# Patient Record
Sex: Female | Born: 1935 | Race: White | Hispanic: No | Smoking: Never smoker
Health system: Southern US, Community
[De-identification: ages and names within clinical notes are randomized; demographics above are authoritative.]

## PROBLEM LIST (undated history)

## (undated) DIAGNOSIS — Z9289 Personal history of other medical treatment: Secondary | ICD-10-CM

## (undated) DIAGNOSIS — N189 Chronic kidney disease, unspecified: Secondary | ICD-10-CM

## (undated) DIAGNOSIS — I219 Acute myocardial infarction, unspecified: Secondary | ICD-10-CM

## (undated) DIAGNOSIS — I639 Cerebral infarction, unspecified: Secondary | ICD-10-CM

## (undated) DIAGNOSIS — H3530 Unspecified macular degeneration: Secondary | ICD-10-CM

## (undated) DIAGNOSIS — M109 Gout, unspecified: Secondary | ICD-10-CM

## (undated) DIAGNOSIS — Z952 Presence of prosthetic heart valve: Secondary | ICD-10-CM

## (undated) DIAGNOSIS — I119 Hypertensive heart disease without heart failure: Secondary | ICD-10-CM

## (undated) DIAGNOSIS — I2510 Atherosclerotic heart disease of native coronary artery without angina pectoris: Secondary | ICD-10-CM

## (undated) DIAGNOSIS — J189 Pneumonia, unspecified organism: Secondary | ICD-10-CM

## (undated) DIAGNOSIS — I6529 Occlusion and stenosis of unspecified carotid artery: Secondary | ICD-10-CM

## (undated) DIAGNOSIS — K219 Gastro-esophageal reflux disease without esophagitis: Secondary | ICD-10-CM

## (undated) DIAGNOSIS — I5032 Chronic diastolic (congestive) heart failure: Secondary | ICD-10-CM

## (undated) DIAGNOSIS — I350 Nonrheumatic aortic (valve) stenosis: Secondary | ICD-10-CM

## (undated) DIAGNOSIS — J42 Unspecified chronic bronchitis: Secondary | ICD-10-CM

## (undated) DIAGNOSIS — E785 Hyperlipidemia, unspecified: Secondary | ICD-10-CM

## (undated) DIAGNOSIS — R0602 Shortness of breath: Secondary | ICD-10-CM

## (undated) DIAGNOSIS — I10 Essential (primary) hypertension: Secondary | ICD-10-CM

## (undated) HISTORY — PX: CARDIAC VALVE REPLACEMENT: SHX585

## (undated) HISTORY — PX: CARDIAC CATHETERIZATION: SHX172

## (undated) HISTORY — PX: CORONARY ANGIOPLASTY WITH STENT PLACEMENT: SHX49

## (undated) HISTORY — PX: TONSILLECTOMY: SUR1361

## (undated) HISTORY — PX: APPENDECTOMY: SHX54

## (undated) HISTORY — DX: Chronic diastolic (congestive) heart failure: I50.32

## (undated) HISTORY — DX: Occlusion and stenosis of unspecified carotid artery: I65.29

## (undated) HISTORY — PX: CATARACT EXTRACTION W/ INTRAOCULAR LENS  IMPLANT, BILATERAL: SHX1307

## (undated) HISTORY — PX: CHOLECYSTECTOMY: SHX55

---

## 2001-04-11 DIAGNOSIS — I639 Cerebral infarction, unspecified: Secondary | ICD-10-CM

## 2001-04-11 HISTORY — DX: Cerebral infarction, unspecified: I63.9

## 2003-09-26 ENCOUNTER — Emergency Department (HOSPITAL_COMMUNITY): Admission: EM | Admit: 2003-09-26 | Discharge: 2003-09-27 | Payer: Self-pay | Admitting: Emergency Medicine

## 2008-06-05 ENCOUNTER — Ambulatory Visit: Payer: Self-pay | Admitting: Thoracic Surgery (Cardiothoracic Vascular Surgery)

## 2008-06-05 ENCOUNTER — Inpatient Hospital Stay (HOSPITAL_BASED_OUTPATIENT_CLINIC_OR_DEPARTMENT_OTHER): Admission: RE | Admit: 2008-06-05 | Discharge: 2008-06-05 | Payer: Self-pay | Admitting: Cardiology

## 2008-06-09 ENCOUNTER — Ambulatory Visit: Payer: Self-pay | Admitting: Thoracic Surgery (Cardiothoracic Vascular Surgery)

## 2008-07-03 ENCOUNTER — Inpatient Hospital Stay (HOSPITAL_COMMUNITY): Admission: RE | Admit: 2008-07-03 | Discharge: 2008-07-04 | Payer: Self-pay | Admitting: Cardiology

## 2010-01-20 ENCOUNTER — Inpatient Hospital Stay (HOSPITAL_COMMUNITY): Admission: AD | Admit: 2010-01-20 | Discharge: 2010-01-28 | Payer: Self-pay | Admitting: Cardiology

## 2010-01-23 ENCOUNTER — Encounter (INDEPENDENT_AMBULATORY_CARE_PROVIDER_SITE_OTHER): Payer: Self-pay | Admitting: Cardiology

## 2010-06-23 LAB — BASIC METABOLIC PANEL
BUN: 12 mg/dL (ref 6–23)
BUN: 19 mg/dL (ref 6–23)
BUN: 30 mg/dL — ABNORMAL HIGH (ref 6–23)
BUN: 31 mg/dL — ABNORMAL HIGH (ref 6–23)
CO2: 25 mEq/L (ref 19–32)
CO2: 29 mEq/L (ref 19–32)
CO2: 29 mEq/L (ref 19–32)
CO2: 29 mEq/L (ref 19–32)
Calcium: 8.5 mg/dL (ref 8.4–10.5)
Calcium: 8.7 mg/dL (ref 8.4–10.5)
Calcium: 8.7 mg/dL (ref 8.4–10.5)
Calcium: 9.4 mg/dL (ref 8.4–10.5)
Calcium: 9.4 mg/dL (ref 8.4–10.5)
Chloride: 100 mEq/L (ref 96–112)
Chloride: 102 mEq/L (ref 96–112)
Chloride: 102 mEq/L (ref 96–112)
Chloride: 95 mEq/L — ABNORMAL LOW (ref 96–112)
Chloride: 96 mEq/L (ref 96–112)
Creatinine, Ser: 0.8 mg/dL (ref 0.4–1.2)
Creatinine, Ser: 0.9 mg/dL (ref 0.4–1.2)
Creatinine, Ser: 2.71 mg/dL — ABNORMAL HIGH (ref 0.4–1.2)
GFR calc Af Amer: 48 mL/min — ABNORMAL LOW (ref >60)
GFR calc Af Amer: >60 mL/min (ref >60)
GFR calc Af Amer: >60 mL/min (ref >60)
GFR calc Af Amer: >60 mL/min (ref >60)
GFR calc Af Amer: >60 mL/min (ref >60)
GFR calc Af Amer: >60 mL/min (ref >60)
GFR calc Af Amer: >60 mL/min (ref >60)
GFR calc non Af Amer: 17 mL/min — ABNORMAL LOW (ref >60)
GFR calc non Af Amer: 36 mL/min — ABNORMAL LOW (ref >60)
GFR calc non Af Amer: 50 mL/min — ABNORMAL LOW (ref >60)
GFR calc non Af Amer: 57 mL/min — ABNORMAL LOW (ref >60)
GFR calc non Af Amer: >60 mL/min (ref >60)
GFR calc non Af Amer: >60 mL/min (ref >60)
GFR calc non Af Amer: >60 mL/min (ref >60)
Glucose, Bld: 108 mg/dL — ABNORMAL HIGH (ref 70–99)
Glucose, Bld: 118 mg/dL — ABNORMAL HIGH (ref 70–99)
Glucose, Bld: 119 mg/dL — ABNORMAL HIGH (ref 70–99)
Glucose, Bld: 158 mg/dL — ABNORMAL HIGH (ref 70–99)
Glucose, Bld: 42 mg/dL — CL (ref 70–99)
Potassium: 2.6 mEq/L — CL (ref 3.5–5.1)
Potassium: 3.1 mEq/L — ABNORMAL LOW (ref 3.5–5.1)
Potassium: 3.6 mEq/L (ref 3.5–5.1)
Potassium: 3.8 mEq/L (ref 3.5–5.1)
Potassium: 3.9 mEq/L (ref 3.5–5.1)
Potassium: 4.3 mEq/L (ref 3.5–5.1)
Sodium: 133 mEq/L — ABNORMAL LOW (ref 135–145)
Sodium: 134 mEq/L — ABNORMAL LOW (ref 135–145)
Sodium: 135 mEq/L (ref 135–145)
Sodium: 137 mEq/L (ref 135–145)
Sodium: 137 mEq/L (ref 135–145)
Sodium: 138 mEq/L (ref 135–145)
Sodium: 139 mEq/L (ref 135–145)

## 2010-06-23 LAB — URINALYSIS, MICROSCOPIC ONLY
Bilirubin Urine: NEGATIVE
Hgb urine dipstick: NEGATIVE
Nitrite: NEGATIVE
Protein, ur: NEGATIVE mg/dL
Specific Gravity, Urine: 1.013 (ref 1.005–1.030)
Urobilinogen, UA: 1 mg/dL (ref 0.0–1.0)

## 2010-06-23 LAB — CARDIAC PANEL(CRET KIN+CKTOT+MB+TROPI)
CK, MB: 0.6 ng/mL (ref 0.3–4.0)
CK, MB: 1.4 ng/mL (ref 0.3–4.0)
CK, MB: 2 ng/mL (ref 0.3–4.0)
Relative Index: INVALID (ref 0.0–2.5)
Relative Index: INVALID (ref 0.0–2.5)
Relative Index: INVALID (ref 0.0–2.5)
Total CK: 38 U/L (ref 7–177)
Total CK: 47 U/L (ref 7–177)
Total CK: 52 U/L (ref 7–177)
Troponin I: 0.03 ng/mL (ref 0.00–0.06)
Troponin I: 0.04 ng/mL (ref 0.00–0.06)

## 2010-06-23 LAB — COMPREHENSIVE METABOLIC PANEL
ALT: 10 U/L (ref 0–35)
AST: 17 U/L (ref 0–37)
Alkaline Phosphatase: 67 U/L (ref 39–117)
CO2: 27 mEq/L (ref 19–32)
Calcium: 9.1 mg/dL (ref 8.4–10.5)
GFR calc Af Amer: >60 mL/min (ref >60)
GFR calc non Af Amer: >60 mL/min (ref >60)
Potassium: 3.1 mEq/L — ABNORMAL LOW (ref 3.5–5.1)
Sodium: 139 mEq/L (ref 135–145)
Total Protein: 5.7 g/dL — ABNORMAL LOW (ref 6.0–8.3)

## 2010-06-23 LAB — BRAIN NATRIURETIC PEPTIDE
Pro B Natriuretic peptide (BNP): 1061 pg/mL — ABNORMAL HIGH (ref 0.0–100.0)
Pro B Natriuretic peptide (BNP): 1465 pg/mL — ABNORMAL HIGH (ref 0.0–100.0)
Pro B Natriuretic peptide (BNP): 1664 pg/mL — ABNORMAL HIGH (ref 0.0–100.0)
Pro B Natriuretic peptide (BNP): 53 pg/mL (ref 0.0–100.0)
Pro B Natriuretic peptide (BNP): 585 pg/mL — ABNORMAL HIGH (ref 0.0–100.0)
Pro B Natriuretic peptide (BNP): 725 pg/mL — ABNORMAL HIGH (ref 0.0–100.0)

## 2010-06-23 LAB — CBC
HCT: 33.5 % — ABNORMAL LOW (ref 36.0–46.0)
Hemoglobin: 10.5 g/dL — ABNORMAL LOW (ref 12.0–15.0)
Hemoglobin: 10.9 g/dL — ABNORMAL LOW (ref 12.0–15.0)
MCHC: 33.1 g/dL (ref 30.0–36.0)
MCV: 85.9 fL (ref 78.0–100.0)
RBC: 3.9 MIL/uL (ref 3.87–5.11)
WBC: 14.9 10*3/uL — ABNORMAL HIGH (ref 4.0–10.5)
WBC: 7.4 10*3/uL (ref 4.0–10.5)

## 2010-06-23 LAB — URINE CULTURE
Colony Count: 6000
Culture  Setup Time: 201110190107

## 2010-06-23 LAB — DIFFERENTIAL
Eosinophils Absolute: 0.3 10*3/uL (ref 0.0–0.7)
Eosinophils Relative: 4 % (ref 0–5)
Lymphs Abs: 1 10*3/uL (ref 0.7–4.0)
Monocytes Relative: 8 % (ref 3–12)

## 2010-07-22 LAB — BASIC METABOLIC PANEL
BUN: 12 mg/dL (ref 6–23)
GFR calc Af Amer: >60 mL/min (ref >60)
GFR calc non Af Amer: >60 mL/min (ref >60)
Potassium: 3.3 mEq/L — ABNORMAL LOW (ref 3.5–5.1)
Sodium: 138 mEq/L (ref 135–145)

## 2010-07-22 LAB — CBC
HCT: 29.3 % — ABNORMAL LOW (ref 36.0–46.0)
Hemoglobin: 10.5 g/dL — ABNORMAL LOW (ref 12.0–15.0)
Platelets: 130 10*3/uL — ABNORMAL LOW (ref 150–400)
WBC: 7.9 10*3/uL (ref 4.0–10.5)

## 2010-07-27 LAB — POCT I-STAT 3, ART BLOOD GAS (G3+)
Acid-base deficit: 1 mmol/L (ref 0.0–2.0)
Bicarbonate: 22.4 mEq/L (ref 20.0–24.0)
pO2, Arterial: 64 mmHg — ABNORMAL LOW (ref 80.0–100.0)

## 2010-08-24 NOTE — Cardiovascular Report (Signed)
NAMESTAMATIA, MASRI                ACCOUNT NO.:  [id number]   MEDICAL RECORD NO.:  [id number]          PATIENT TYPE:  OIB   LOCATION:  1962                         FACILITY:  [facility]   PHYSICIAN:  Armanda Magic, M.D.     DATE OF BIRTH:  05/19/1923   DATE OF PROCEDURE:  DATE OF DISCHARGE:  06/05/2008                            CARDIAC CATHETERIZATION   PROCEDURE:  Coronary angiography.   OPERATOR:  Armanda Magic, MD   INDICATIONS:  Severe aortic stenosis, shortness of breath, and chest  pain.   COMPLICATIONS:  None.   INTRAVENOUS ACCESS:  Via right femoral artery, 4-French sheath.   INTRAVENOUS MEDICATIONS:  Versed 1 mg.   This is an 77 year old female who presented recently to my office with  new onset of shortness of breath and chest tightness.  She was noted to  have a systolic heart murmur, consistent with aortic valve disease, and  underwent 2-D echocardiogram.  A 2-D echocardiogram showed severe aortic  stenosis with an aortic valve area of less than 1 sq cm.  She now  presents for cardiac catheterization for further evaluation.   The patient was brought to the cardiac catheterization laboratory in a  fasting nonsedated state.  Informed consent was obtained.  The patient  was connected to continuous heart rate and pulse oximetry monitor,  intermittent blood pressure monitor.  The right groin was prepped and  draped in sterile fashion.  A 1% Xylocaine was used for local  anesthesia.  Using the modified Seldinger technique, a 4-French sheath  was placed in the right femoral artery.  Of note, this required multiple  groin sticks in order to access the femoral artery with adequate blood  flow to pass a wire.  Under fluoroscopic guidance, a 4-French JL4  catheter was placed into the vicinity of the left main coronary artery  but could not cannulate the artery.  The entire aortic root, ascending  aorta, and ostia of the coronary arteries were heavily calcified, making  catheter  manipulation very difficult.  The catheter was exchanged out  over a guidewire for a 4-French JL3.5 catheter, which successfully  engaged the coronary ostium.  Multiple cine films were taken at 30-  degree RAO, 40-degree LAO views.  This catheter was then exchanged out  over a guidewire for a 4-French 3DRCA catheter, which again was  unsuccessful in engaging the coronary ostium.  The catheter was  exchanged out over a guidewire for a 4-French JR4 catheter, which  successfully engaged the coronary ostium.  Multiple cine films taken at  30-degree RAO and 40-degree LAO views.  The catheter was then removed  over a guide wire.  Attempts across the aortic valve were not performed  because of the patient's heavily calcified aorta and aortic valve with  risk of possibly dislodging a cholesterol plaque.  At the end of the  procedure, all catheters and sheaths were removed.  Manual compression  was performed, so adequate hemostasis was obtained.  The patient was  transferred back to room in stable condition.  Of note, a right heart  catheterization was not performed because  after multiple attempts at  getting access into the right femoral artery, there was a moderate-sized  groin hematoma.  Access into the venous system was attempted several  times but was unsuccessful.   RESULTS:  1. Left main coronary artery is heavily calcified but widely patent      and bifurcates into left anterior descending artery and left      circumflex artery.  The left anterior descending artery is widely      patent at its proximal portion giving large to moderate sized first      diagonal branch, which is widely patent.  Just distal to the      takeoff of the first diagonal branch, there was an 80% lesion of      the LAD.  The ongoing LAD is widely patent and traverses the apex.  2. The left circumflex artery gives rise to a large posterior      descending artery, thereby making this a left dominant system.  The       ongoing left circumflex traverses the AV groove and is widely      patent.  3. The right coronary artery is nondominant with a 90% ostial lesion      and then a 70% proximal to mid lesion.  The ongoing RCA terminates      in a posterolateral branch, which is widely patent.   CONCLUSION:  1. Two-vessel obstructive coronary disease.  2. Normal left ventricular function by echocardiogram.  3. Severe aortic stenosis by echocardiogram.  4. Heavily calcified aortic root and aortic valve.   PLAN:  CVTS consult.      Armanda Magic, M.D.  Electronically Signed     TT/MEDQ  D:  06/05/2008  T:  06/06/2008  Job:  [id number]

## 2010-08-24 NOTE — Discharge Summary (Signed)
Kaylee Schmitt, Kaylee Schmitt                ACCOUNT NO.:  [id number]   MEDICAL RECORD NO.:  [id number]          PATIENT TYPE:  OIB   LOCATION:  2501                         FACILITY:  [facility]   PHYSICIAN:  Francisca December, M.D.  DATE OF BIRTH:  [birth year]   DATE OF ADMISSION:  07/03/2008  DATE OF DISCHARGE:  07/04/2008                               DISCHARGE SUMMARY   DISCHARGE DIAGNOSES:  1. Coronary artery disease, status post bare metal stent to an left      anterior descending lesion.  2. Aortic stenosis.  3. Hypertension.  4. Coronary artery disease.  5. Long-term medication use.  6. Dyslipidemia.   Kaylee Schmitt is an 77 year old female who initially underwent  catheterization in February 2010 and was found to have two-vessel  obstructive coronary artery disease.  The right coronary artery,  nondominant, had a 90% ostial lesion and then a 70% proximal to mid  lesion.  Just distal to the takeoff of the first diagonal branch, there  was an 80% LAD lesion.  The patient was also noted to have severe aortic  stenosis by echocardiogram.   Initially, it was felt that she would be a surgical candidate, however  she followed Dr. Cornelius Moras and he felt that the patient had severe  calcification of the entire aorta (diffusely and circumferentially) and  he felt the patient would not be a candidate for conventional surgical  approach.  He discussed options with the patient with one option being  continued medical therapy or going ahead with percutaneous intervention.  He did state that percutaneous or transapical aortic valve replacement  could be considered on an investigational basis such as use of the  Edwards Sapien valve which would have to be done at [facility] in  [place].   Ultimately, it was decided to bring the patient in for percutaneous  intervention.  She was brought into the hospital on July 03, 2008, and  underwent Liberte stenting x2 to the LAD lesion.  The patient tolerated  this well and was ready to go home the following day.   LABORATORY STUDIES:  During the patient's hospital stay included sodium  138 and potassium 3.3, repleted.  BUN 12, creatinine 0.79.  Hemoglobin  10.5, hematocrit 29.3, white count 7.9, and platelets 130.  EKG shows  sinus bradycardia, rate 52 with nonspecific ST-T wave changes  inferolaterally.   DISCHARGE MEDICATIONS:  1. Enteric-coated aspirin 325 mg a day.  2. Prilosec 20 mg a day.  3. Plavix 75 mg a day.  4. Nitrofurantoin 100 mg a day.  5. Sertraline (Zoloft) 100 mg a day.  6. Estrace daily.  7. Detrol LA 1 daily.  8. Simvastatin 40 mg a day.  9. Diovan 80 mg a day.  10.Vitamin D daily.  11.Atenolol 50 mg t.i.d.  12.Norvasc 10 mg a day.  13.Altace 10 mg a day.   The patient is being discharged home in stable but improved condition.  Clean cath site gently with soap and water.  No scrubbing.  Increase  activity slowly.  No lifting over 10 pounds for 1 week.  No driving for  2 days.  Follow up with Dr. Mayford Knife on July 21, 2008, at 3:15 p.m.  Dr.  Amil Amen did state that the patient would need to remain on Plavix for  minimum of 1-2 months.      Guy Franco, P.A.      Francisca December, M.D.  Electronically Signed    LB/MEDQ  D:  07/04/2008  T:  07/04/2008  Job:  [id number]   cc:   Armanda Magic, M.D.

## 2010-08-24 NOTE — Assessment & Plan Note (Signed)
OFFICE VISIT   LIAH, MORR  DOB:  [birth year]                                        June 09, 2008  CHART #:  [id number]   The patient returns to the office today for further consultation  regarding severe aortic stenosis and coronary artery disease.  She was  originally seen in consultation at the time of her heart catheterization  on June 05, 2008.  A full consultation note was dictated at that  time.  She returns to the office today with her daughter to discuss  matters further.  She has brought with her a copy of a chest x-ray  performed recently on June 04, 2008.  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  She has severe diffuse  calcification throughout her entire ascending transverse and proximal  descending thoracic aorta.  There was suggestion of this noted at the  time for her heart catheterization, and this routine plain film chest x-  ray confirms these findings.  The severity of calcification is diffuse,  circumferential and involving the entire aorta.  Under the  circumstances, I suspect that it would be a big mistake to consider  conventional surgical approach for aortic valve replacement and coronary  artery bypass grafting.   I have discussed alternatives at length with the patient and her  daughter.  One option would be to continue with medical therapy  indefinitely.  Another option might be to include percutaneous coronary  intervention and stenting of her coronary artery stenosis with long-term  medical therapy for her aortic stenosis.  Finally, percutaneous or  transapical aortic valve replacement could be considered on an  investigational basis such as use of the [employer] valve.  The  nearest center that continues to implant the Sapien valve in the  patient's is [facility] in [place].  This valve was not yet FDA  approved.  There are other valves in development that may be available  in the near future as well, and it is my understanding that it would not  be longer for the [employer] percutaneous valve will be implanted on an  investigational basis at [facility].  I discussed  these matters at length with the patient and her daughter.  All of her  questions have been addressed.  They will continue to discuss matters  further with Dr. Mayford Knife.  All of their questions have been addressed.  Again, I do not feel that the patient should  be considered a candidate for conventional aortic valve replacement and  coronary artery bypass grafting.   Salvatore Decent. Cornelius Moras, M.D.  Electronically Signed   CHO/MEDQ  D:  06/09/2008  T:  06/09/2008  Job:  [id number]   cc:   Armanda Magic, M.D.  Eugene Gavia, MD

## 2010-08-24 NOTE — Consult Note (Signed)
[name], [name]                ACCOUNT NO.:  [id number]   MEDICAL RECORD NO.:  [id number]          PATIENT TYPE:  OIB   LOCATION:  1962                         FACILITY:  [facility]   PHYSICIAN:  Salvatore Decent. Cornelius Moras, M.D. DATE OF BIRTH:  01/04/1924   DATE OF CONSULTATION:  06/05/2008  DATE OF DISCHARGE:                                 CONSULTATION   REQUESTING PHYSICIAN:  Armanda Magic, MD   PRIMARY CARE PHYSICIAN:  Eugene Gavia, MD, [facility].   REASON FOR CONSULTATION:  Severe aortic stenosis and two-vessel coronary  artery disease.   HISTORY OF PRESENT ILLNESS:  [name] is an 77 year old widowed white  female, who currently lives locally here in [place] with her  daughter.  Her cardiac history dates back to 2003, when she suffered a  right hemispheric stroke.  At that time, she was in [place] and details  of that hospitalization are not available.  Her stroke was manifested  initially as left-sided hemiplegia.  She now has mild left-sided  weakness.  She walks with a cane.  One year later, she was treated for  congestive heart failure, again while she lived in [place].  She was  told that she had a heart murmur at that time.  Her congestive heart  failure symptoms improved with medical therapy.  More recently, she has  been followed by Dr. Karel Jarvis.  She has a known history of aortic  stenosis.  Over the last year, she has developed worsening symptoms of  progressive exertional shortness of breath.  These symptoms have  progressed substantially over the last several months.  She was referred  to Dr. Mayford Knife, who first evaluated the patient 3 weeks ago.  An  echocardiogram was performed on June 03, 2008, at [facility].  By report, this examination demonstrated normal left ventricular size  and function with mild concentric left ventricular hypertrophy and  severe aortic stenosis.  The peak velocity across the valve was measured  4.1 meters per second  with peak and mean transvalvular gradients  estimated 67 and 35 mmHg respectively.  The aortic valve area was  estimated to be 0.76 cm2.  Ms. Giel was brought in for elective  cardiac catheterization today by Dr. Mayford Knife.  Coronary arteriography  revealed severe two-vessel coronary artery disease.  The aortic valve  was not crossed at the time of catheterization.  There was severe  calcification involving the aortic root.  Right heart catheterization  was not performed.  Cardiothoracic surgical consultation was requested.   REVIEW OF SYSTEMS:  GENERAL:  The patient reports progressive exertional  shortness of breath and fatigue over the last year.  Her appetite is  decreased substantially, although she has not lost any weight.  CARDIAC:  The patient denies resting shortness of breath, although she admits that  she will get winded quickly even with talking.  She denies PND,  orthopnea, palpitations, syncope.  The patient has had some heaviness  and tightness across her chest associated with episodes of severe  shortness of breath.  These are usually related to physical activity.  She has not had any nocturnal angina.  She has not had PND.  She has not  had tachypalpitations.  RESPIRATORY:  Notable for progressive shortness  of breath.  The patient denies productive cough, hemoptysis, wheezing.  GASTROINTESTINAL:  Negative.  The patient reports no difficulty  swallowing, although she states that occasionally she will get choked on  food.  She has chronic constipation, but stable bowel function.  She  denies hematochezia, hematemesis, melena.  MUSCULOSKELETAL:  Notable for  moderate chronic back pain as well as some chronic pain in her right  knee related to degenerative arthritis.  NEUROLOGIC:  Notable for  chronic mild left-sided weakness.  Her gait is mildly unsteady and she  walks with a cane.  GENITOURINARY:  Notable for history of chronic  recurrent urinary infections, although the  symptoms have been improved  recently.  She has stress urinary incontinence.  HEENT:  Negative.  INFECTIOUS:  Negative.   PAST MEDICAL HISTORY:  1. Aortic stenosis.  2. Coronary artery disease.  3. Hypertension.  4. Congestive heart failure.  5. Cerebrovascular disease, status post stroke in 2003.  6. Stress urinary incontinence.  7. Chronic recurrent urinary tract infection.  8. Hyperlipidemia.   PAST SURGICAL HISTORY:  1. Appendectomy.  2. Cholecystectomy.  3. Cataract extraction.  4. Tonsillectomy.   FAMILY HISTORY:  Noncontributory.   SOCIAL HISTORY:  The patient is widowed and lives with her daughter  locally here in [place].  She has 7 living grown children and  numerous grandchildren.  She lives a sedentary lifestyle.   CURRENT MEDICATIONS:  1. Prilosec OTC 20 mg daily.  2. Nitrofurantoin 100 mg daily.  3. Aspirin 81 mg daily.  4. Sertraline 180 mg daily.  5. Estrace 0.1 mg as directed.  6. Atenolol 50 mg 3 times daily.  7. Norvasc 5 mg daily.  8. Multivitamin 1 tablet daily.  9. Detrol LA 2 mg daily.  10.Altace 5 mg daily.   DRUG ALLERGIES:  SULFA and PENICILLIN.   PHYSICAL EXAMINATION:  GENERAL:  The patient is a well-appearing,  moderately obese, elderly female, who appears her stated age, in no  acute distress.  She has mildly slurred speech.  VITAL SIGNS:  She is in sinus rhythm.  Blood pressure is elevated.  HEENT:  Unrevealing.  NECK:  Supple.  There are bilateral carotid bruits.  CARDIAC:  Auscultation of the chest reveals regular rate and rhythm.  There is a prominent grade 3/6 late-peaking crescendo-decrescendo  systolic murmur heard best at the right upper sternal border with  radiation all across the precordium into the neck.  No diastolic murmurs  are noted.  LUNGS:  Breath sounds are clear with some bibasilar inspiratory  crackles.  No wheezes or rhonchi are noted.  ABDOMEN:  Soft and nontender.  Bowel sounds are present.  EXTREMITIES:   Warm and adequately perfused.  Pulses are palpable in the  posterior tibial position.  There is no sign of significant venous  insufficiency.  RECTAL/GU:  Both deferred.  NEUROLOGIC:  Very mild left-sided weakness.   DIAGNOSTIC TESTS:  Report of 2-D echocardiogram performed on June 03, 2008, is reviewed with results as discussed previously.  Cardiac  catheterization performed today is reviewed.  This reveals two-vessel  coronary artery disease with 70-80% stenosis of mid-left anterior  descending coronary artery after takeoff of the large diagonal branch.  The left circumflex coronary artery is relatively free of disease.  There is codominant coronary circulation.  There is 80-90%  ostial  stenosis of the right coronary artery with 70% stenosis of the proximal  right coronary artery.  The aortic root and the ascending aorta are  heavily calcified.  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  Further imaging  might be helpful.   IMPRESSION:  Severe aortic stenosis with two-vessel coronary artery  disease and progressive symptoms of congestive heart failure, functional  class III.  Ms. Pownall has severely calcified ascending thoracic aorta,  which might preclude safe surgical intervention.  She has had a previous  stroke in the past.   PLAN:  I have discussed matters at length with Ms. Salim and her  family here at the bedside this afternoon.  I would like to take a look  at the chest x-ray she had performed recently as well as her recent  echocardiogram to get a better look at the aortic root and the ascending  aorta.  CT angiogram of the aorta might be helpful.  Options might  include continued medical therapy indefinitely with associated guarded  long-term prognosis.  Percutaneous coronary intervention could be  entertained for treatment of her CAD.  Alternatively high-risk surgery  could be entertained depending  upon the status of the aorta.  Finally, a  third alternative might include percutaneously placed aortic valve such  as the Edwards Sapien bioprosthesis, currently under investigational  development.  This could be performed with or without percutaneous  coronary intervention.  I will plan to see Ms. Montrose back in the  office on Monday to review her recent chest x-ray and echocardiogram.  We will discuss matters further at that time.      Salvatore Decent. Cornelius Moras, M.D.  Electronically Signed     CHO/MEDQ  D:  06/05/2008  T:  06/06/2008  Job:  [id number]   cc:   Armanda Magic, M.D.  Eugene Gavia, MD

## 2011-05-09 ENCOUNTER — Ambulatory Visit
Admission: RE | Admit: 2011-05-09 | Discharge: 2011-05-09 | Disposition: A | Discharge status: Home / Self Care | Payer: Medicare Other | Source: Ambulatory Visit | Attending: Cardiology | Admitting: Cardiology

## 2011-05-09 ENCOUNTER — Other Ambulatory Visit: Payer: Self-pay | Admitting: Cardiology

## 2011-05-09 DIAGNOSIS — S0990XA Unspecified injury of head, initial encounter: Secondary | ICD-10-CM

## 2012-10-12 ENCOUNTER — Encounter (HOSPITAL_COMMUNITY): Payer: Self-pay | Admitting: Emergency Medicine

## 2012-10-12 ENCOUNTER — Other Ambulatory Visit: Payer: Self-pay | Admitting: Cardiology

## 2012-10-12 ENCOUNTER — Emergency Department (HOSPITAL_COMMUNITY): Payer: Medicare Other

## 2012-10-12 ENCOUNTER — Inpatient Hospital Stay (HOSPITAL_COMMUNITY)
Admission: EM | Admit: 2012-10-12 | Discharge: 2012-10-19 | DRG: 286 | Disposition: A | Discharge status: Home / Self Care | Payer: Medicare Other | Attending: Cardiology | Admitting: Cardiology

## 2012-10-12 DIAGNOSIS — I5023 Acute on chronic systolic (congestive) heart failure: Secondary | ICD-10-CM

## 2012-10-12 DIAGNOSIS — Z9861 Coronary angioplasty status: Secondary | ICD-10-CM

## 2012-10-12 DIAGNOSIS — I119 Hypertensive heart disease without heart failure: Secondary | ICD-10-CM

## 2012-10-12 DIAGNOSIS — I359 Nonrheumatic aortic valve disorder, unspecified: Secondary | ICD-10-CM

## 2012-10-12 DIAGNOSIS — I110 Hypertensive heart disease with heart failure: Secondary | ICD-10-CM

## 2012-10-12 DIAGNOSIS — M109 Gout, unspecified: Secondary | ICD-10-CM | POA: Insufficient documentation

## 2012-10-12 DIAGNOSIS — F411 Generalized anxiety disorder: Secondary | ICD-10-CM | POA: Diagnosis present

## 2012-10-12 DIAGNOSIS — I5031 Acute diastolic (congestive) heart failure: Secondary | ICD-10-CM | POA: Diagnosis present

## 2012-10-12 DIAGNOSIS — I350 Nonrheumatic aortic (valve) stenosis: Secondary | ICD-10-CM

## 2012-10-12 DIAGNOSIS — I10 Essential (primary) hypertension: Secondary | ICD-10-CM | POA: Diagnosis present

## 2012-10-12 DIAGNOSIS — E876 Hypokalemia: Secondary | ICD-10-CM | POA: Diagnosis present

## 2012-10-12 DIAGNOSIS — N179 Acute kidney failure, unspecified: Secondary | ICD-10-CM | POA: Diagnosis present

## 2012-10-12 DIAGNOSIS — D649 Anemia, unspecified: Secondary | ICD-10-CM | POA: Diagnosis present

## 2012-10-12 DIAGNOSIS — Z954 Presence of other heart-valve replacement: Secondary | ICD-10-CM

## 2012-10-12 DIAGNOSIS — I2789 Other specified pulmonary heart diseases: Secondary | ICD-10-CM | POA: Diagnosis present

## 2012-10-12 DIAGNOSIS — N189 Chronic kidney disease, unspecified: Secondary | ICD-10-CM | POA: Diagnosis present

## 2012-10-12 DIAGNOSIS — I130 Hypertensive heart and chronic kidney disease with heart failure and stage 1 through stage 4 chronic kidney disease, or unspecified chronic kidney disease: Principal | ICD-10-CM | POA: Diagnosis present

## 2012-10-12 DIAGNOSIS — M129 Arthropathy, unspecified: Secondary | ICD-10-CM | POA: Diagnosis present

## 2012-10-12 DIAGNOSIS — I69959 Hemiplegia and hemiparesis following unspecified cerebrovascular disease affecting unspecified side: Secondary | ICD-10-CM

## 2012-10-12 DIAGNOSIS — Z8673 Personal history of transient ischemic attack (TIA), and cerebral infarction without residual deficits: Secondary | ICD-10-CM | POA: Insufficient documentation

## 2012-10-12 DIAGNOSIS — I2510 Atherosclerotic heart disease of native coronary artery without angina pectoris: Secondary | ICD-10-CM | POA: Diagnosis present

## 2012-10-12 DIAGNOSIS — I509 Heart failure, unspecified: Secondary | ICD-10-CM | POA: Diagnosis present

## 2012-10-12 DIAGNOSIS — I080 Rheumatic disorders of both mitral and aortic valves: Secondary | ICD-10-CM | POA: Diagnosis present

## 2012-10-12 DIAGNOSIS — E785 Hyperlipidemia, unspecified: Secondary | ICD-10-CM | POA: Diagnosis present

## 2012-10-12 HISTORY — DX: Hyperlipidemia, unspecified: E78.5

## 2012-10-12 HISTORY — DX: Hypertensive heart disease without heart failure: I11.9

## 2012-10-12 HISTORY — DX: Nonrheumatic aortic (valve) stenosis: I35.0

## 2012-10-12 HISTORY — DX: Atherosclerotic heart disease of native coronary artery without angina pectoris: I25.10

## 2012-10-12 HISTORY — DX: Gout, unspecified: M10.9

## 2012-10-12 LAB — COMPREHENSIVE METABOLIC PANEL
ALT: 7 U/L (ref 0–35)
AST: 13 U/L (ref 0–37)
Albumin: 3.6 g/dL (ref 3.5–5.2)
Alkaline Phosphatase: 83 U/L (ref 39–117)
BUN: 21 mg/dL (ref 6–23)
Chloride: 103 mEq/L (ref 96–112)
Potassium: 3.2 mEq/L — ABNORMAL LOW (ref 3.5–5.1)
Sodium: 140 mEq/L (ref 135–145)
Total Bilirubin: 1.4 mg/dL — ABNORMAL HIGH (ref 0.3–1.2)
Total Protein: 6.3 g/dL (ref 6.0–8.3)

## 2012-10-12 LAB — CBC
HCT: 30 % — ABNORMAL LOW (ref 36.0–46.0)
Hemoglobin: 11 g/dL — ABNORMAL LOW (ref 12.0–15.0)
Hemoglobin: 10.3 g/dL — ABNORMAL LOW (ref 12.0–15.0)
MCH: 29.7 pg (ref 26.0–34.0)
MCHC: 34.3 g/dL (ref 30.0–36.0)
MCV: 87 fL (ref 78.0–100.0)
RBC: 3.45 MIL/uL — ABNORMAL LOW (ref 3.87–5.11)
WBC: 11.5 10*3/uL — ABNORMAL HIGH (ref 4.0–10.5)

## 2012-10-12 LAB — URINALYSIS, ROUTINE W REFLEX MICROSCOPIC
Glucose, UA: NEGATIVE mg/dL
Hgb urine dipstick: NEGATIVE
Ketones, ur: NEGATIVE mg/dL
Protein, ur: NEGATIVE mg/dL
Urobilinogen, UA: 0.2 mg/dL (ref 0.0–1.0)

## 2012-10-12 LAB — BASIC METABOLIC PANEL
BUN: 22 mg/dL (ref 6–23)
Calcium: 9.4 mg/dL (ref 8.4–10.5)
Creatinine, Ser: 1.1 mg/dL (ref 0.50–1.10)
GFR calc non Af Amer: 44 mL/min — ABNORMAL LOW (ref >90)
Glucose, Bld: 120 mg/dL — ABNORMAL HIGH (ref 70–99)
Sodium: 139 mEq/L (ref 135–145)

## 2012-10-12 LAB — POCT I-STAT TROPONIN I: Troponin i, poc: 0.07 ng/mL (ref 0.00–0.08)

## 2012-10-12 MED ORDER — SODIUM CHLORIDE 0.9 % IV SOLN: 250.0000 mL | INTRAVENOUS | Status: DC | PRN

## 2012-10-12 MED ORDER — POTASSIUM CHLORIDE CRYS ER 20 MEQ PO TBCR
40.0000 meq | EXTENDED_RELEASE_TABLET | Freq: Once | ORAL | Status: AC
  Administered 2012-10-12: 40 meq via ORAL
  Filled 2012-10-12: qty 2

## 2012-10-12 MED ORDER — ALPRAZOLAM 0.25 MG PO TABS
0.2500 mg | ORAL_TABLET | Freq: Two times a day (BID) | ORAL | Status: DC | PRN
  Administered 2012-10-14: 0.25 mg via ORAL
  Filled 2012-10-12: qty 1

## 2012-10-12 MED ORDER — SERTRALINE HCL 100 MG PO TABS
100.0000 mg | ORAL_TABLET | Freq: Every day | ORAL | Status: DC
  Administered 2012-10-13 – 2012-10-19 (×7): 100 mg via ORAL
  Filled 2012-10-12 (×8): qty 1

## 2012-10-12 MED ORDER — FUROSEMIDE 10 MG/ML IJ SOLN
40.0000 mg | Freq: Two times a day (BID) | INTRAMUSCULAR | Status: DC
  Administered 2012-10-12 – 2012-10-14 (×4): 40 mg via INTRAVENOUS
  Filled 2012-10-12 (×6): qty 4

## 2012-10-12 MED ORDER — HYDRALAZINE HCL 20 MG/ML IJ SOLN
10.0000 mg | INTRAMUSCULAR | Status: DC | PRN
  Administered 2012-10-13 (×2): 10 mg via INTRAVENOUS
  Filled 2012-10-12 (×2): qty 1

## 2012-10-12 MED ORDER — FUROSEMIDE 10 MG/ML IJ SOLN: 40.0000 mg | Freq: Two times a day (BID) | INTRAMUSCULAR | Status: DC

## 2012-10-12 MED ORDER — NITROGLYCERIN 0.4 MG SL SUBL: 0.4000 mg | SUBLINGUAL_TABLET | SUBLINGUAL | Status: DC | PRN

## 2012-10-12 MED ORDER — AMLODIPINE BESYLATE 5 MG PO TABS
5.0000 mg | ORAL_TABLET | Freq: Every day | ORAL | Status: DC
  Filled 2012-10-12: qty 1

## 2012-10-12 MED ORDER — PANTOPRAZOLE SODIUM 40 MG PO TBEC
40.0000 mg | DELAYED_RELEASE_TABLET | Freq: Every day | ORAL | Status: DC
  Administered 2012-10-13 – 2012-10-19 (×7): 40 mg via ORAL
  Filled 2012-10-12 (×8): qty 1

## 2012-10-12 MED ORDER — ACETAMINOPHEN 325 MG PO TABS
650.0000 mg | ORAL_TABLET | ORAL | Status: DC | PRN
  Administered 2012-10-14: 650 mg via ORAL
  Filled 2012-10-12: qty 2

## 2012-10-12 MED ORDER — AMLODIPINE BESYLATE 5 MG PO TABS
5.0000 mg | ORAL_TABLET | ORAL | Status: DC
  Administered 2012-10-12 – 2012-10-18 (×7): 5 mg via ORAL
  Filled 2012-10-12 (×8): qty 1

## 2012-10-12 MED ORDER — ONDANSETRON HCL 4 MG/2ML IJ SOLN: 4.0000 mg | Freq: Four times a day (QID) | INTRAMUSCULAR | Status: DC | PRN

## 2012-10-12 MED ORDER — FUROSEMIDE 10 MG/ML IJ SOLN
40.0000 mg | Freq: Once | INTRAMUSCULAR | Status: AC
  Administered 2012-10-12: 40 mg via INTRAVENOUS
  Filled 2012-10-12: qty 4

## 2012-10-12 MED ORDER — SODIUM CHLORIDE 0.9 % IJ SOLN: 3.0000 mL | INTRAMUSCULAR | Status: DC | PRN

## 2012-10-12 MED ORDER — SODIUM CHLORIDE 0.9 % IJ SOLN
3.0000 mL | Freq: Two times a day (BID) | INTRAMUSCULAR | Status: DC
  Administered 2012-10-12 – 2012-10-19 (×10): 3 mL via INTRAVENOUS

## 2012-10-12 MED ORDER — ENOXAPARIN SODIUM 30 MG/0.3ML ~~LOC~~ SOLN
30.0000 mg | SUBCUTANEOUS | Status: DC
  Administered 2012-10-12 – 2012-10-17 (×6): 30 mg via SUBCUTANEOUS
  Filled 2012-10-12 (×7): qty 0.3

## 2012-10-12 MED ORDER — ASPIRIN 325 MG PO TABS
325.0000 mg | ORAL_TABLET | Freq: Every day | ORAL | Status: DC
  Administered 2012-10-13 – 2012-10-19 (×6): 325 mg via ORAL
  Filled 2012-10-12 (×8): qty 1

## 2012-10-12 MED ORDER — SPIRONOLACTONE 25 MG PO TABS
25.0000 mg | ORAL_TABLET | Freq: Every day | ORAL | Status: DC
  Administered 2012-10-13 – 2012-10-15 (×3): 25 mg via ORAL
  Filled 2012-10-12 (×3): qty 1

## 2012-10-12 MED ORDER — IRBESARTAN 150 MG PO TABS
150.0000 mg | ORAL_TABLET | Freq: Every day | ORAL | Status: DC
  Administered 2012-10-13 – 2012-10-17 (×5): 150 mg via ORAL
  Filled 2012-10-12 (×5): qty 1

## 2012-10-12 MED ORDER — ATENOLOL 25 MG PO TABS
25.0000 mg | ORAL_TABLET | Freq: Every day | ORAL | Status: DC
  Administered 2012-10-13 – 2012-10-19 (×6): 25 mg via ORAL
  Filled 2012-10-12 (×8): qty 1

## 2012-10-12 NOTE — Progress Notes (Signed)
*  PRELIMINARY RESULTS* Echocardiogram 2D Echocardiogram has been performed.  Kaylee Schmitt 10/12/2012, 2:27 PM

## 2012-10-12 NOTE — ED Notes (Signed)
Pts O2 went down to 87%. Put on 2L [place]. O2 up to 97%

## 2012-10-12 NOTE — H&P (Signed)
History and Physical   Admit date: 10/12/2012 Name:  Kaylee Schmitt Medical record number: [id number] DOB/Age:  13-Jun-1923  79 y.o. female  Primary Cardiologist: Dr. Carolanne Grumbling  Chief complaint/reason for admission:  Shortness of breath and chest pain  HPI:  This 79 year old female has a known history of critical aortic stenosis. 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 At the time she was turned down for valve replacement surgery due to the porcelain aorta and had stenting with 2 non-drug-eluting stents to the LAD by Dr. Amil Amen for an 80% mid LAD stenosis.  She was evaluated at [facility] for percutaneous aortic valve replacement stated that they never heard from them and she eventually decided not to have it done. She currently lives with her daughter and is basically leading a sedentary existence. She is severely dyspneic when she gets out of walks and does have some intermittent chest pressure. She goes around in a wheelchair but is significantly limited with dyspnea on exertion. She has had several days of PND and orthopnea and had difficulty sleeping at night as well as mid sternal chest discomfort and tightness. She came to the emergency room this morning where she was found to be in acute congestive heart failure and is brought in for further evaluation of heart failure in the setting of critical aortic stenosis. She has not had much in the way of peripheral edema.  She does have a remote right brain stroke while she was in [place] that left her with a mild left hemiparesis.   Past Medical History  Diagnosis Date  . Aortic stenosis   . CHF (congestive heart failure)   . CAD (coronary artery disease) 10/12/2012    Cath 2011  Calcified LM,  80% mid LAD, dominant circ without sig disease, Ostial nondominant RCA.  Severe AS  Liberte stent x 2 2011 Dr. Amil Amen    . History of CVA (cerebrovascular accident)     2003 while living in [place].    Right brain with left hemiparesis   . Gout 10/12/2012  . Hypertensive heart disease 10/12/2012  . Hyperlipidemia 10/12/2012      Past Surgical History  Procedure Laterality Date  . Coronary stent placement    . Cholecystectomy    . Appendectomy    . Cataract extraction    .  Allergies: is allergic to penicillins and sulfa antibiotics.   Medications: Prior to Admission medications   Medication Sig Start Date End Date Taking? Authorizing Provider  amLODipine (NORVASC) 5 MG tablet Take 5 mg by mouth daily.   Yes Historical Provider, MD  aspirin 325 MG tablet Take 325 mg by mouth daily.   Yes Historical Provider, MD  atenolol (TENORMIN) 50 MG tablet Take 25 mg by mouth daily.   Yes Historical Provider, MD  furosemide (LASIX) 40 MG tablet Take 40 mg by mouth daily.   Yes Historical Provider, MD  omeprazole (PRILOSEC) 20 MG capsule Take 20 mg by mouth daily.   Yes Historical Provider, MD  sertraline (ZOLOFT) 100 MG tablet Take 100 mg by mouth daily.   Yes Historical Provider, MD  spironolactone (ALDACTONE) 25 MG tablet Take 25 mg by mouth daily.   Yes Historical Provider, MD  valsartan (DIOVAN) 160 MG tablet Take 160 mg by mouth daily.   Yes Historical Provider, MD    Family History:  Family Status  Relation Status Death Age  . Mother Deceased 49  died of CAD  . Father Deceased 32    died of metastatic prostate cancer    Social History:   reports that she has never smoked. She does not have any smokeless tobacco history on file. She reports that she does not drink alcohol or use illicit drugs.   History   Social History Narrative   Widow.  Lives with daughter.     Review of Systems: She is very hard of hearing and wears hearing aids. 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 She has significant abdominal complaints of dyspepsia and significant GI intolerance to foods. She has arthritis involving her right knee. She has some mild weakness  of her left side due to her old stroke. She has significant anxiety. He does have a remote history of gout. Other than as noted above, the remainder of the review of systems is normal  Physical Exam: BP 179/41  Pulse 60  Temp(Src) 97.8 F (36.6 C) (Oral)  Resp 16  SpO2 98%  General appearance: Pleasant elderly female who is currently very hard of hearing Head: Normocephalic, without obvious abnormality, atraumatic Eyes: conjunctivae/corneas clear. PERRL, EOM's intact. Fundi  Not examined Ears: Bilateral hearing aids present Neck: no adenopathy, no carotid bruit, no JVD, supple, symmetrical, trachea midline and Transmitted murmur into the neck Lungs: A. [ Heart: Regular rhythm, high-pitched 2-3/6 systolic murmur with diminished S2, no S3 or murmurs of aortic area radiates to carotids. Abdomen: soft, non-tender; bowel sounds normal; no masses,  no organomegaly Pelvic: deferred Extremities: extremities normal, atraumatic, no cyanosis or edema Pulses: 2+ and symmetric Skin: Skin color, texture, turgor normal. No rashes or lesions Neurologic: Grossly normal  Labs: CBC  Recent Labs  10/12/12 0958  WBC 11.5*  RBC 3.70*  HGB 11.0*  HCT 32.1*  PLT 170  MCV 86.8  MCH 29.7  MCHC 34.3  RDW 16.1*   CMP   Recent Labs  10/12/12 0958  NA 139  K 3.3*  CL 102  CO2 23  GLUCOSE 120*  BUN 22  CREATININE 1.10  CALCIUM 9.4  GFRNONAA 44*  GFRAA 51*   BNP (last 3 results)  Recent Labs  10/12/12 0958  PROBNP 31828.0*   Cardiac Panel (last 3 results) Troponin (Point of Care Test)  Recent Labs  10/12/12 1041  TROPIPOC 0.07   EKG: Right bundle branch block, nonspecific ST changes, sinus rhythm  Radiology: Interstitial congestion consistent with congestive heart failure   IMPRESSIONS: 1. Acute congestive heart failure due to aortic stenosis 2. Critical aortic stenosis 3. History of coronary artery disease with previous stent of the LAD 4. Hypertensive heart  disease 5. Hyperlipidemia 6. History of right brain stroke  PLAN: Intravenous diuresis, follow renal function. Check serial cardiac enzymes. Once she has gotten over her heart failure we may have to reevaluated to determine what the risks are and whether she would be a candidate for percutaneous trans-aortic valve replacement.  Signed: Darden Palmer MD [facility]  10/12/2012, 12:07 PM

## 2012-10-12 NOTE — ED Notes (Signed)
Pt arrives from home with daughter who states her mother has been SOB at home for the last 3 days. Last night was unable to lie flat to sleep and worsening SOB. Pt reports chest pain also substernal, radiaiting to left side and back. Pt alert, oriented x4, tachypneic, unable to speak in full sentences at present.

## 2012-10-12 NOTE — Progress Notes (Signed)
  Called due to patient with SBP 195.  Has been 170-195 most of day. Just got amlodipine. She has critical AS and admitted for HF and IV diuresis.   Amlodipine just given recently.   Will give hydralazine 10mg  IV q4hr PRN. Primary team to re-address in am.  Truman Hayward 11:39 PM

## 2012-10-12 NOTE — ED Provider Notes (Signed)
History    CSN: [id number] Arrival date & time 10/12/12  0944  First MD Initiated Contact with Patient 10/12/12 1000     Chief Complaint  Patient presents with  . Shortness of Breath   (Consider location/radiation/quality/duration/timing/severity/associated sxs/prior Treatment) The history is provided by the patient and a relative.  Kaylee Schmitt is a 79 y.o. female hx of severe aortic stenosis with CHF, HTN, CAD with stent here with SOB. Shortness of breath for the last 3 days and that is worse when laying down. She'll have some intermittent substernal chest pain radiating to the left side. She is taking her Lasix but has not been helping. She denies worsening short of breath when she walks but she did have baseline does not ambulate much. As per the daughter the aortic stenosis is inoperable. She also has worsening leg swelling.     Past Medical History  Diagnosis Date  . Aortic stenosis   . CHF (congestive heart failure)   . Hypertension   . Stroke    Past Surgical History  Procedure Laterality Date  . Cardiac surgery    . Coronary stent placement    . Cholecystectomy    . Appendectomy    . Cataract extraction     No family history on file. History  Substance Use Topics  . Smoking status: Never Smoker   . Smokeless tobacco: Not on file  . Alcohol Use: No   OB History   Grav Para Term Preterm Abortions TAB SAB Ect Mult Living                 Review of Systems  Respiratory: Positive for shortness of breath.   Cardiovascular: Positive for leg swelling.  All other systems reviewed and are negative.    Allergies  Penicillins and Sulfa antibiotics  Home Medications   Current Outpatient Rx  Name  Route  Sig  Dispense  Refill  . amLODipine (NORVASC) 5 MG tablet   Oral   Take 5 mg by mouth daily.         Marland Kitchen aspirin 325 MG tablet   Oral   Take 325 mg by mouth daily.         Marland Kitchen atenolol (TENORMIN) 50 MG tablet   Oral   Take 25 mg by mouth daily.          . furosemide (LASIX) 40 MG tablet   Oral   Take 40 mg by mouth daily.         Marland Kitchen omeprazole (PRILOSEC) 20 MG capsule   Oral   Take 20 mg by mouth daily.         . sertraline (ZOLOFT) 100 MG tablet   Oral   Take 100 mg by mouth daily.         Marland Kitchen spironolactone (ALDACTONE) 25 MG tablet   Oral   Take 25 mg by mouth daily.         . valsartan (DIOVAN) 160 MG tablet   Oral   Take 160 mg by mouth daily.          BP 179/41  Pulse 60  Temp(Src) 97.8 F (36.6 C) (Oral)  Resp 16  SpO2 98% Physical Exam  Nursing note and vitals reviewed. Constitutional:  Chronically ill, hard of hearing, pleasant   HENT:  Head: Normocephalic.  Mouth/Throat: Oropharynx is clear and moist.  Eyes: Conjunctivae are normal. Pupils are equal, round, and reactive to light.  Neck: Normal range of motion. Neck supple.  Cardiovascular: Regular rhythm.   Obvious 3/6 systolic ejection murmur loudest at LUSB   Pulmonary/Chest:  Slightly tachypneic, crackles bilateral bases   Abdominal: Soft. Bowel sounds are normal. She exhibits no distension. There is no tenderness. There is no rebound and no guarding.  Musculoskeletal: Normal range of motion.  2+ edema bilateral legs   Neurological: She is alert.  Moving all extremities   Skin: Skin is warm and dry.  Psychiatric: She has a normal mood and affect. Her behavior is normal. Judgment and thought content normal.    ED Course  Procedures (including critical care time) Labs Reviewed  CBC - Abnormal; Notable for the following:    WBC 11.5 (*)    RBC 3.70 (*)    Hemoglobin 11.0 (*)    HCT 32.1 (*)    RDW 16.1 (*)    All other components within normal limits  BASIC METABOLIC PANEL - Abnormal; Notable for the following:    Potassium 3.3 (*)    Glucose, Bld 120 (*)    GFR calc non Af Amer 44 (*)    GFR calc Af Amer 51 (*)    All other components within normal limits  PRO B NATRIURETIC PEPTIDE - Abnormal; Notable for the following:    Pro B  Natriuretic peptide (BNP) 31828.0 (*)    All other components within normal limits  URINALYSIS, ROUTINE W REFLEX MICROSCOPIC  POCT I-STAT TROPONIN I   Dg Chest Port 1 View  10/12/2012   *RADIOLOGY REPORT*  Clinical Data: Chest pain. Shortness of breath.  Stroke. Congestive heart failure.  PORTABLE CHEST - 1 VIEW  Comparison: 09/15/2011  Findings: Cardiomegaly noted with perihilar and left basilar airspace opacities which are increased compared to prior.  Atherosclerotic calcification of the aortic arch is noted.  Kerley B lines are present and increased from prior exam.  IMPRESSION:  1.  Perihilar and left basilar airspace opacities with Kerley B lines, favoring edema over multilobar pneumonia. 2.  Cardiomegaly.   Original Report Authenticated By: Gaylyn Rong, M.D.   No diagnosis found.   Date: 10/12/2012  Rate: 57  Rhythm: normal sinus rhythm  QRS Axis: normal  Intervals: normal  ST/T Wave abnormalities: nonspecific ST changes  Conduction Disutrbances:none  Narrative Interpretation:   Old EKG Reviewed: unchanged    MDM  Kaylee Schmitt is a 79 y.o. female here with SOB, leg swelling. Likely worsening aortic stenosis causing CHF exacerbation. Will try to diurese slowly. Will likely need admission.   11:59 AM BNP elevated, CXR showed CHF. I called Dr. Donnie Aho, who is covering Dr. Mayford Knife, and will admit for CHF exacerbation.    Richardean Canal, MD 10/12/12 1200

## 2012-10-13 LAB — TROPONIN I: Troponin I: <0.3 ng/mL (ref <0.30)

## 2012-10-13 LAB — BASIC METABOLIC PANEL
BUN: 20 mg/dL (ref 6–23)
Calcium: 9.1 mg/dL (ref 8.4–10.5)
Chloride: 101 mEq/L (ref 96–112)
Creatinine, Ser: 1.05 mg/dL (ref 0.50–1.10)
GFR calc Af Amer: 54 mL/min — ABNORMAL LOW (ref >90)

## 2012-10-13 LAB — RETICULOCYTES
RBC.: 3.56 MIL/uL — ABNORMAL LOW (ref 3.87–5.11)
Retic Ct Pct: 4 % — ABNORMAL HIGH (ref 0.4–3.1)

## 2012-10-13 MED ORDER — POTASSIUM CHLORIDE 20 MEQ/15ML (10%) PO LIQD
40.0000 meq | Freq: Once | ORAL | Status: DC
  Filled 2012-10-13: qty 30

## 2012-10-13 MED ORDER — POTASSIUM CHLORIDE CRYS ER 20 MEQ PO TBCR
40.0000 meq | EXTENDED_RELEASE_TABLET | Freq: Once | ORAL | Status: AC
  Administered 2012-10-13: 40 meq via ORAL
  Filled 2012-10-13: qty 2

## 2012-10-13 NOTE — Progress Notes (Signed)
Subjective:  Breathing better overnight  Objective:  Vital Signs in the last 24 hours: BP 149/66  Pulse 66  Temp(Src) 98.4 F (36.9 C) (Oral)  Resp 18  Wt 60.328 kg (133 lb)  SpO2 91%  Physical Exam: Elderly WF hard of hearing Lungs:  Clear today Cardiac:  Regular rhythm, normal S1 and S2, no S3 2-3/6 systolic murmur Abdomen:  Soft, nontender, no masses Extremities:  No edema present  Intake/Output from previous day: 07/04 0701 - 07/05 0700 In: -  Out: 1975 [Urine:1975] Weight Filed Weights   10/12/12 1726 10/13/12 0442  Weight: 61.236 kg (135 lb) 60.328 kg (133 lb)    Lab Results: Basic Metabolic Panel:  Recent Labs  09/81/19 1300 10/13/12 0040  NA 140 137  K 3.2* 3.5  CL 103 101  CO2 25 23  GLUCOSE 113* 110*  BUN 21 20  CREATININE 1.12* 1.05    CBC:  Recent Labs  10/12/12 0958 10/12/12 1300  WBC 11.5* 11.5*  HGB 11.0* 10.3*  HCT 32.1* 30.0*  MCV 86.8 87.0  PLT 170 155    BNP    Component Value Date/Time   PROBNP 31828.0* 10/12/2012 0958   Telemetry: Sinus rhythm  Assessment/Plan:  1. Acute diastolic CHF 2. Critical aortic stenosis 3. CAD 4. Anemia 5. Hypokalemia  Rec:  COntinue diuresis.  Will ask Dr. Excell Seltzer to see Monday to eval risks and benefits of TAVR in this situation.  W/u anemia.      Kaylee Palmer  MD [facility] Card[facility]  10/13/2012, 10:11 AM

## 2012-10-14 LAB — IRON AND TIBC
Saturation Ratios: 11 % — ABNORMAL LOW (ref 20–55)
TIBC: 310 ug/dL (ref 250–470)

## 2012-10-14 LAB — BASIC METABOLIC PANEL
BUN: 27 mg/dL — ABNORMAL HIGH (ref 6–23)
Chloride: 100 mEq/L (ref 96–112)
GFR calc Af Amer: 42 mL/min — ABNORMAL LOW (ref >90)
Glucose, Bld: 113 mg/dL — ABNORMAL HIGH (ref 70–99)
Potassium: 4.1 mEq/L (ref 3.5–5.1)

## 2012-10-14 LAB — FERRITIN: Ferritin: 263 ng/mL (ref 10–291)

## 2012-10-14 LAB — URINE CULTURE: Colony Count: >=100000

## 2012-10-14 LAB — VITAMIN B12: Vitamin B-12: 364 pg/mL (ref 211–911)

## 2012-10-14 NOTE — Progress Notes (Signed)
Subjective:  Her breathing is fine but she had severe cramps in her left leg and is currently complaining of discomfort in her left leg at the present time. She has no chest pain at the present time.  Objective:  Vital Signs in the last 24 hours: BP 158/68  Pulse 63  Temp(Src) 98.1 F (36.7 C) (Oral)  Resp 18  Ht 4\' 11"  (1.499 m)  Wt 59.376 kg (130 lb 14.4 oz)  BMI 26.42 kg/m2  SpO2 92%  Physical Exam: Elderly WF hard of hearing, lying in bed complaining of pain in her left calf and leg Lungs:  Clear today Cardiac:  Regular rhythm, normal S1 and S2, no S3 2-3/6 systolic murmur Abdomen:  Soft, nontender, no masses Extremities:  No edema present  Intake/Output from previous day: 07/05 0701 - 07/06 0700 In: 600 [P.O.:600] Out: 2225 [Urine:2225] Weight Filed Weights   10/12/12 1726 10/13/12 0442 10/14/12 0543  Weight: 61.236 kg (135 lb) 60.328 kg (133 lb) 59.376 kg (130 lb 14.4 oz)    Lab Results: Basic Metabolic Panel:  Recent Labs  16/10/96 0040 10/14/12 0420  NA 137 135  K 3.5 4.1  CL 101 100  CO2 23 26  GLUCOSE 110* 113*  BUN 20 27*  CREATININE 1.05 1.30*    CBC:  Recent Labs  10/12/12 0958 10/12/12 1300  WBC 11.5* 11.5*  HGB 11.0* 10.3*  HCT 32.1* 30.0*  MCV 86.8 87.0  PLT 170 155    BNP    Component Value Date/Time   PROBNP 31828.0* 10/12/2012 0958   Telemetry: Sinus rhythm  Assessment/Plan:  1. Acute diastolic CHF 2. Critical aortic stenosis 3. CAD 4. Anemia 5. slight worsening of renal function  Rec:  Back off on diuresis. Dr. Mayford Knife will resume care in the morning. Add Xanax for cramps as well as Tylenol. The patient would like to find out what the risks for TaVR would be at this time and this will be arranged tomorrow.      Darden Palmer  MD [facility]  10/14/2012, 10:33 AM

## 2012-10-15 ENCOUNTER — Telehealth: Payer: Self-pay | Admitting: *Deleted

## 2012-10-15 LAB — BASIC METABOLIC PANEL
BUN: 39 mg/dL — ABNORMAL HIGH (ref 6–23)
CO2: 24 mEq/L (ref 19–32)
Chloride: 100 mEq/L (ref 96–112)
Creatinine, Ser: 1.61 mg/dL — ABNORMAL HIGH (ref 0.50–1.10)
Potassium: 3.9 mEq/L (ref 3.5–5.1)

## 2012-10-15 NOTE — Progress Notes (Signed)
UR Completed Smayan Hackbart Graves-Bigelow, RN,BSN [phone]  

## 2012-10-15 NOTE — Care Management Note (Signed)
    Page 1 of 2   10/19/2012     2:29:20 PM   CARE MANAGEMENT NOTE 10/19/2012  Patient:  Kaylee Schmitt, Kaylee Schmitt   Account Number:  [id number]  Date Initiated:  10/15/2012  Documentation initiated by:  GRAVES-BIGELOW,BRENDA  Subjective/Objective Assessment:   Pt admitted for Shortness of breath and chest pain. Iv diuresis. Pt is from home with daughter.     Action/Plan:   CM left agency choice for daughter at bedside. Daughter at work at time of visit. CM will continue to monitor.   Anticipated DC Date:  10/19/2012   Anticipated DC Plan:  HOME W HOME HEALTH SERVICES      DC Planning Services  CM consult      [facility] Choice  HOME HEALTH   Choice offered to / List presented to:  C-4 Adult Children        HH arranged  HH-1 RN  HH-2 PT      [facility] agency  [facility]   Status of service:  Completed, signed off Medicare Important Message given?   (If response is "NO", the following Medicare IM given date fields will be blank) Date Medicare IM given:   Date Additional Medicare IM given:    Discharge Disposition:  HOME W HOME HEALTH SERVICES  Per UR Regulation:  Reviewed for med. necessity/level of care/duration of stay  If discussed at Long Length of Stay Meetings, dates discussed:   10/18/2012    Comments:  10/19/12- 1420- Donn Pierini RN, BSN [phone] Pt for d/c today- order for HH-RN/PT- call made to Lupita Leash with [facility] regarding pt's discharge today- HH services to begin within 24-48 hr post discharge.  10/18/12- 0800- Donn Pierini RN BSN [phone] Received call from pt's daughter regarding [facility] agency of choice per telephone conversation daughter has chosen [facility] for [facility] services and is agreeable to [facility] for CHF management. Call made to [facility] with [facility] for [facility] referral- will need order for HH-RN prior to discharge- sticky note left in chart for MD. Daughter Wynona Canes left additional contact # for work which are [phone]- [phone] or [phone].  10-17-12 1555 Tomi Bamberger,  [place] [phone] Pt/ family wants to proceed with TAVR and will have cath tomorrow. CM did try to attempt to call daughter for the name of agency to use for [facility] services. CM did speak to son and his wife and she was going to relay information to pt's daughter. Will f/u.   Acute diastolic CHF -Critical aortic stenosis with procelain aorta - deemed not a surgical candidate -patient has been evaluated in [place] at [facility] in the past for TAVR-Patient now wants to consider TAVR if deemed a candidate- plan to consult Dr. Excell Seltzer to determine if she is even a candidate.  If deemed not to be a good candidate then will discuss Hospice care again with the family. 10-16-12 [address] [place] [phone]

## 2012-10-15 NOTE — Progress Notes (Addendum)
SUBJECTIVE:  occasional mild SOB/CP  OBJECTIVE:   Vitals:   Filed Vitals:   10/14/12 1430 10/14/12 2030 10/15/12 0558 10/15/12 1113  BP: 106/53 156/53 158/70 145/83  Pulse: 55 55 58 61  Temp: 97.3 F (36.3 C) 97.8 F (36.6 C) 97.8 F (36.6 C)   TempSrc:  Oral Oral   Resp: 17 18 18    Height:      Weight:   58.45 kg (128 lb 13.7 oz)   SpO2: 97% 94% 94%    I&O's:   Intake/Output Summary (Last 24 hours) at 10/15/12 1117 Last data filed at 10/15/12 0843  Gross per 24 hour  Intake    480 ml  Output    350 ml  Net    130 ml   TELEMETRY: Reviewed telemetry pt in NST:     PHYSICAL EXAM General: Well developed, well nourished, in no acute distress Head: Eyes PERRLA, No xanthomas.   Normal cephalic and atramatic  Lungs:   Clear bilaterally to auscultation and percussion. Heart:   HRRR S1 S2 Pulses are 2+ & equal. 2/6 SM at RUSB-LLSB Abdomen: Bowel sounds are positive, abdomen soft and non-tender without masses  Extremities:   No clubbing, cyanosis or edema.  DP +1 Neuro: Alert and oriented X 3. Psych:  Good affect, responds appropriately   LABS: Basic Metabolic Panel:  Recent Labs  04/54/09 0420 10/15/12 0520  NA 135 136  K 4.1 3.9  CL 100 100  CO2 26 24  GLUCOSE 113* 100*  BUN 27* 39*  CREATININE 1.30* 1.61*  CALCIUM 9.4 9.1   Liver Function Tests:  Recent Labs  10/12/12 1300  AST 13  ALT 7  ALKPHOS 83  BILITOT 1.4*  PROT 6.3  ALBUMIN 3.6   No results found for this basename: LIPASE, AMYLASE,  in the last 72 hours CBC:  Recent Labs  10/12/12 1300  WBC 11.5*  HGB 10.3*  HCT 30.0*  MCV 87.0  PLT 155   Cardiac Enzymes:  Recent Labs  10/12/12 1300 10/12/12 1925 10/13/12 0040  TROPONINI <0.30 <0.30 <0.30   BNP: No components found with this basename: POCBNP,  D-Dimer: No results found for this basename: DDIMER,  in the last 72 hours Hemoglobin A1C: No results found for this basename: HGBA1C,  in the last 72 hours Fasting Lipid  Panel: No results found for this basename: CHOL, HDL, LDLCALC, TRIG, CHOLHDL, LDLDIRECT,  in the last 72 hours Thyroid Function Tests:  Recent Labs  10/12/12 1300  TSH 3.582   Anemia Panel:  Recent Labs  10/13/12 1340  VITAMINB12 364  FOLATE 16.5  FERRITIN 263  TIBC 310  IRON 35*  RETICCTPCT 4.0*   Coag Panel:   Lab Results  Component Value Date   INR 1.13 01/20/2010    RADIOLOGY: Dg Chest Port 1 View  10/12/2012   *RADIOLOGY REPORT*  Clinical Data: Chest pain. Shortness of breath.  Stroke. Congestive heart failure.  PORTABLE CHEST - 1 VIEW  Comparison: 09/15/2011  Findings: Cardiomegaly noted with perihilar and left basilar airspace opacities which are increased compared to prior.  Atherosclerotic calcification of the aortic arch is noted.  Kerley B lines are present and increased from prior exam.  IMPRESSION:  1.  Perihilar and left basilar airspace opacities with Kerley B lines, favoring edema over multilobar pneumonia. 2.  Cardiomegaly.   Original Report Authenticated By: Gaylyn Rong, M.D.   Assessment/Plan:  1. Acute diastolic CHF  2. Critical aortic stenosis with procelain aorta - deemed  not a surgical candidate   -patient has been evaluated in [place] at [facility] in the past for TAVR.  Of note patient told Dr. Donnie Aho that she never heard from [facility] but actually she was contacted several times and also offered a second opinion at [facility] and declined procedure.  At last OV we discussed Hospice given her symptoms of SOB but she declined. 3. CAD with 80% LAF s/p PCI x 2 2011 4. Anemia  5. Acute on chronic renal insufficiency 6.  HTN  - Patient now wants to consider TAVR if deemed a candidate.  I will consult Dr. Excell Seltzer to determine if she is even a candidate.  If deemed not to be a good candidate then will discuss Hospice care again with the family.  - Hold Spironolactone secondary to elevated creatinine  Quintella Reichert, MD  10/15/2012  11:17 AM

## 2012-10-16 ENCOUNTER — Encounter (HOSPITAL_COMMUNITY): Payer: Self-pay | Admitting: Cardiovascular Disease

## 2012-10-16 ENCOUNTER — Inpatient Hospital Stay (HOSPITAL_COMMUNITY): Payer: Medicare Other

## 2012-10-16 DIAGNOSIS — I2510 Atherosclerotic heart disease of native coronary artery without angina pectoris: Secondary | ICD-10-CM

## 2012-10-16 DIAGNOSIS — I509 Heart failure, unspecified: Secondary | ICD-10-CM

## 2012-10-16 DIAGNOSIS — I5031 Acute diastolic (congestive) heart failure: Secondary | ICD-10-CM

## 2012-10-16 DIAGNOSIS — I359 Nonrheumatic aortic valve disorder, unspecified: Secondary | ICD-10-CM

## 2012-10-16 LAB — BASIC METABOLIC PANEL
CO2: 26 mEq/L (ref 19–32)
Calcium: 9.6 mg/dL (ref 8.4–10.5)
Chloride: 96 mEq/L (ref 96–112)
Glucose, Bld: 113 mg/dL — ABNORMAL HIGH (ref 70–99)
Sodium: 134 mEq/L — ABNORMAL LOW (ref 135–145)

## 2012-10-16 NOTE — Progress Notes (Addendum)
SUBJECTIVE: complains of feeling fatigued today.  Denies SOB or CP  OBJECTIVE:   Vitals:   Filed Vitals:   10/15/12 1113 10/15/12 1318 10/16/12 0526 10/16/12 0935  BP: 145/83 137/50 139/71 124/54  Pulse: 61 55 64 63  Temp:  97.4 F (36.3 C) 99.4 F (37.4 C)   TempSrc:  Oral Oral   Resp:  18 16   Height:      Weight:   60.011 kg (132 lb 4.8 oz)   SpO2:  93% 93%    I&O's:   Intake/Output Summary (Last 24 hours) at 10/16/12 1004 Last data filed at 10/16/12 0500  Gross per 24 hour  Intake    240 ml  Output    750 ml  Net   -510 ml   TELEMETRY: Reviewed telemetry pt in NSR:     PHYSICAL EXAM General: Well developed, well nourished, in no acute distress Head: Eyes PERRLA, No xanthomas.   Normal cephalic and atramatic  Lungs:   Clear bilaterally to auscultation and percussion. Heart:   HRRR S1 S2 Pulses are 2+ & equal.  2/6 SM at RUSB to LLSB Abdomen: Bowel sounds are positive, abdomen soft and non-tender without masses Extremities:   No clubbing, cyanosis or edema.  DP +1 Neuro: Alert and oriented X 3. Psych:  Good affect, responds appropriately   LABS: Basic Metabolic Panel:  Recent Labs  40/98/11 0420 10/15/12 0520  NA 135 136  K 4.1 3.9  CL 100 100  CO2 26 24  GLUCOSE 113* 100*  BUN 27* 39*  CREATININE 1.30* 1.61*  CALCIUM 9.4 9.1    Recent Labs  10/13/12 1340  VITAMINB12 364  FOLATE 16.5  FERRITIN 263  TIBC 310  IRON 35*  RETICCTPCT 4.0*   Coag Panel:   Lab Results  Component Value Date   INR 1.13 01/20/2010    RADIOLOGY: Dg Chest Port 1 View  10/12/2012   *RADIOLOGY REPORT*  Clinical Data: Chest pain. Shortness of breath.  Stroke. Congestive heart failure.  PORTABLE CHEST - 1 VIEW  Comparison: 09/15/2011  Findings: Cardiomegaly noted with perihilar and left basilar airspace opacities which are increased compared to prior.  Atherosclerotic calcification of the aortic arch is noted.  Kerley B lines are present and increased from prior exam.   IMPRESSION:  1.  Perihilar and left basilar airspace opacities with Kerley B lines, favoring edema over multilobar pneumonia. 2.  Cardiomegaly.   Original Report Authenticated By: Gaylyn Rong, M.D.    Assessment/Plan:  1. Acute diastolic CHF - improved although patient has decreased BS at right base.  Currently diuretics are on hold due to renal insufficiency 2. Critical aortic stenosis with procelain aorta - deemed not a surgical candidate -patient has been evaluated in [place] at [facility] in the past for TAVR. Of note patient told Dr. Donnie Aho that she never heard from [facility] but actually she was contacted several times and also offered a second opinion at [facility] and declined procedure. At last OV we discussed Hospice given her symptoms of SOB but she declined.  3. CAD with 80% LAF s/p PCI x 2 2011  4. Anemia  5. Acute on chronic renal insufficiency - aldactone stopped yesterday 6. HTN - good control  - Patient now wants to consider TAVR if deemed a candidate. I have spoken to Dr. Excell Seltzer to determine if she is  a candidate.   He is out of town this week and Dr. Cornelius Moras will consult to see if patient is an  adequate candidate.  If she is not a candidate for TAVR then will discuss Hospice care again with the family.  - check PA and Lat CXRAY today and BMET - If creatinine still elevated will stop Irbesartan - will need to be on at least a small dose of diuretic in the future but will hold for now give worsening renal function    Quintella Reichert, MD  10/16/2012  10:04 AM

## 2012-10-16 NOTE — Consult Note (Signed)
Patient ID: Kaylee Schmitt MRN: [id number] DOB/AGE: 07/01/1923 79 y.o.  Admit date: 10/12/2012 Referring Physician: Armanda Magic Primary Cardiologist: Armanda Magic Reason for Consultation: Severe Aortic valve stenosis.   HPI: 79 yo female with history of severe aortic valve stenosis, chronic diastolic CHF, CAD, CVA, gout, hypertensive heart disease and hyperlipidemia who I am asked to see for discussion regarding possible candidacy for transcatheter aortic valve replacement. She was admitted to [facility] 10/12/12 with orthopnea, PND and chest pressure and found to have volume overload c/w acute on chronic diastolic CHF. She has been diuresed with symptomatic improvement and feels much better. She is still dyspneic with walking and notes some dyspnea with talking . She is down 3.7 liters since admission. Echo 10/12/12 with severe LVH, normal LV systolic function with severe aortic valve stenosis (peak gradient , mean gradient 38 mm Hg). She has been known to have severe aortic valve stenosis since 2010 when she underwent cardiac catheterization revealing severe LAD stenosis. She was seen in March 2010 by Dr. Tressie Stalker and felt to be a poor candidate for conventional aortic valve replacement given the finding of porcelain aorta. Two bare metal stents were placed in the LAD at that time. She was referred to [facility] in 2010 for evaluation for TAVR and went to one appointment. Her case was being reviewed by the TAVR team there but she did not go back. She has not wished to pursue transcatheter therapy since then despite being told several times by Dr. Mayford Knife over the last 2 years that this could be offered at other local institutions.   Currently only complaining of some dyspnea. She has had some chest pressure over the last few weeks but none currently. She states that she walks around her house but does no heavy exertion.     Past Medical History  Diagnosis Date  . Aortic  stenosis   . CHF (congestive heart failure)   . CAD (coronary artery disease) 10/12/2012    Cath 2011  Calcified LM,  80% mid LAD, dominant circ without sig disease, Ostial nondominant RCA.  Severe AS  Liberte stent x 2 2011 Dr. Amil Amen    . History of CVA (cerebrovascular accident)     2003 while living in [place].   Right brain with left hemiparesis   . Gout 10/12/2012  . Hypertensive heart disease 10/12/2012  . Hyperlipidemia 10/12/2012    Family History  Problem Relation Age of Onset  . Cancer - Prostate Father   . CAD Mother     History   Social History  . Marital Status: Widowed    Spouse Name: N/A    Number of Children: 8  . Years of Education: N/A   Occupational History  . Not on file.   Social History Main Topics  . Smoking status: Never Smoker   . Smokeless tobacco: Not on file  . Alcohol Use: No  . Drug Use: No  . Sexually Active: Not on file   Other Topics Concern  . Not on file   Social History Narrative   Widow.  Lives with daughter.    Past Surgical History  Procedure Laterality Date  . Coronary stent placement    . Cholecystectomy    . Appendectomy    . Cataract extraction      Allergies  Allergen Reactions  . Penicillins Itching and Rash  . Sulfa Antibiotics Itching and Rash    Prescriptions prior to admission  Medication Sig Dispense Refill  .  amLODipine (NORVASC) 5 MG tablet Take 5 mg by mouth daily.      Marland Kitchen aspirin 325 MG tablet Take 325 mg by mouth daily.      Marland Kitchen atenolol (TENORMIN) 50 MG tablet Take 25 mg by mouth daily.      . furosemide (LASIX) 40 MG tablet Take 40 mg by mouth daily.      Marland Kitchen omeprazole (PRILOSEC) 20 MG capsule Take 20 mg by mouth daily.      . sertraline (ZOLOFT) 100 MG tablet Take 100 mg by mouth daily.      Marland Kitchen spironolactone (ALDACTONE) 25 MG tablet Take 25 mg by mouth daily.      . valsartan (DIOVAN) 160 MG tablet Take 160 mg by mouth daily.        Review of systems complete and found to be negative unless listed above    Physical Exam: Blood pressure 124/54, pulse 63, temperature 99.4 F (37.4 C), temperature source Oral, resp. rate 16, height 4\' 11"  (1.499 m), weight 132 lb 4.8 oz (60.011 kg), SpO2 93.00%.    General: Elderly female, Well developed, well nourished, NAD  HEENT: OP clear, mucus membranes moist  SKIN: warm, dry. No rashes.  Neuro: No focal deficits  Musculoskeletal: Muscle strength 5/5 right upper and lower extremity. 4/5 left upper and lower extremity.  Psychiatric: Mood and affect normal  Neck: + JVD, no carotid bruits, no thyromegaly, no lymphadenopathy.  Lungs:Clear bilaterally, no wheezes, rhonci, crackles  Cardiovascular: Regular rate and rhythm. Harsh systolic murmur. No gallops or rubs.  Abdomen:Soft. Bowel sounds present. Non-tender.  Extremities: No lower extremity edema.   Labs:   Lab Results  Component Value Date   WBC 11.5* 10/12/2012   HGB 10.3* 10/12/2012   HCT 30.0* 10/12/2012   MCV 87.0 10/12/2012   PLT 155 10/12/2012    Recent Labs Lab 10/12/12 1300  10/15/12 0520  NA 140  < > 136  K 3.2*  < > 3.9  CL 103  < > 100  CO2 25  < > 24  BUN 21  < > 39*  CREATININE 1.12*  < > 1.61*  CALCIUM 9.3  < > 9.1  PROT 6.3  --   --   BILITOT 1.4*  --   --   ALKPHOS 83  --   --   ALT 7  --   --   AST 13  --   --   GLUCOSE 113*  < > 100*  < > = values in this interval not displayed. Lab Results  Component Value Date   CKTOTAL 52 01/25/2010   CKMB 0.6 01/25/2010   TROPONINI <0.30 10/13/2012    Echo:  Left ventricle: The cavity size was normal. Wall thickness was increased in a pattern of severe LVH. Systolic function was normal. The estimated ejection fraction was in the range of 60% to 65%. Wall motion was normal; there were no regional wall motion abnormalities. - Aortic valve: Valve mobility was severely restricted. There was severe stenosis. Mild regurgitation. Valve area: 0.53cm^2(VTI). Valve area: 0.47cm^2 (Vmax). - Mitral valve: Severely calcified annulus.  Moderately calcified leaflets . - Left atrium: The atrium was moderately to severely dilated. - Pulmonary arteries: Systolic pressure was moderately increased.  EKG: Sinus rhythm, 1st degree AV block, RBBB, LAFB.   ASSESSMENT AND PLAN: 79 yo female with severe aortic valve stenosis, hypertensive heart disease admitted with acute on chronic diastolic CHF. Her aortic valve stenosis is likely contributing to her acute presentation with  CHF. She has been felt to be a poor candidate for conventional aortic valve replacement in the past. She was seen by Dr. Cornelius Moras in 2010 and conventional surgery was not an option at that time due to her porcelain aorta. She has been unwilling to consider transcatheter approach to aortic valve replacement in the past but she is now willing to discuss this as an option. She is relatively immobile at home but does walk around her house to do simple chores. She has some mild residual left sided weakness post CVA in 2003 but this does not limit her activities of daily living. The echo findings suggest moderately severe/severe AS with mean gradient of 38 mm Hg across the aortic valve.   I have reviewed the current indications for aortic valve replacement. While her mean gradient is under 40 mmHg, she most certainly has severe AS. A provocative test with dobutamine echo would likely demonstrate a higher gradient across the aortic valve. While her functional status is limited, she appears to be a good candidate for TAVR. She will be evaluated by Dr. Cornelius Moras who is part of the multidisciplinary valve team later today.  She is still undecided at this time if she would like to pursue further workup for transcatheter aortic valve replacement. We will discuss further planning including right and left heart catheterization during this admission if she agrees to proceed.    Signed: MCALHANY,CHRISTOPHER 10/16/2012, 2:02 PM

## 2012-10-16 NOTE — Consult Note (Addendum)
[address]       Kaylee Schmitt [address]             [id number]     CARDIOTHORACIC SURGERY CONSULTATION REPORT  Referring Provider is TURNER, Cornelious Bryant, MD  Chief Complaint  Patient presents with  . Shortness of Breath    HPI:  Patient is an 79 year old widowed white female with known history of aortic stenosis, congestive heart failure, coronary artery disease, cerebrovascular disease, and hypertension. Her cardiac history dates back to 2003 when she suffered a right hemispheric stroke. At that time she lived in [place] and symptoms reportedly were initially manifested as left sided hemiplegia, and the patient recovered with mild residual left-sided weakness. The following year she first developed symptoms of congestive heart failure, and subsequently she moved permanently [place] where she has lived with her daughter ever since.  Over the years she developed slow gradual progression of symptoms of exertional shortness of breath.  In 2010 she was found to have severe calcific aortic stenosis.  Cardiac catheterization revealed two-vessel coronary artery disease, and the patient was referred for surgical consultation. I had the opportunity to meet the patient at that time, but the patient was noted to have extremely severe calcification of the entire ascending thoracic aorta. As a result she was not felt to be candidate for conventional surgical aortic valve replacement with or without coronary artery bypass grafting. She subsequently underwent PCI and stenting of the left anterior descending coronary artery using bare metal stents and she was referred to [facility] in [place] to consider enrolling in an ongoing research trial involving transcatheter aortic valve replacement.  Apparently several months went by after the patient's initial evaluation in [place], and subsequently the patient's family stopped pursuing this alternative.  The patient has continued to follow  up regularly with Dr. Mayford Knife ever since.  Over the last few months the patient has had further progression of symptoms of severe exertional shortness of breath with intermittent substernal chest tightness. She lives a fairly sedentary existence, although the patient is still able to ambulate short distances using a cane or walker. She is limited by degenerative arthritis in her knees and mild weakness in her left lower extremity related to her previous stroke. She has mild forgetfulness although this has not been progressive nor very problematic.  She also suffers from considerable hearing loss.  However, the patient and her family note that her primary limitation is that of exertional shortness of breath and weakness. Her appetite has been somewhat decreased and she has probably lost 10-20 pounds. Prior to admission the patient developed further progression of shortness of breath with multiple episodes of PND and orthopnea with resting shortness of breath and chest tightness occurring primarily at night. She was admitted to the hospital in class IV congestive heart failure 4 days ago. Symptoms have improved quickly with diuretic therapy. Followup echocardiogram demonstrates the presence of severe calcific aortic stenosis with peak velocity across the aortic valve ranging between 3.6 and 4.1 m/sec.  Left ventricular systolic function remains reasonably well-preserved. Cardiothoracic surgical consultation was requested to consider possible transcatheter aortic valve replacement.  Past Medical History  Diagnosis Date  . Aortic stenosis   . CHF (congestive heart failure)   . CAD (coronary artery disease) 10/12/2012    Cath 2011  Calcified LM,  80% mid LAD, dominant circ without sig disease, Ostial nondominant RCA.  Severe AS  Liberte stent x 2 2011 Dr. Amil Amen    .  History of CVA (cerebrovascular accident)     2003 while living in [place].   Right brain with left hemiparesis   . Gout 10/12/2012  . Hypertensive  heart disease 10/12/2012  . Hyperlipidemia 10/12/2012    Past Surgical History  Procedure Laterality Date  . Coronary stent placement    . Cholecystectomy    . Appendectomy    . Cataract extraction      Family History  Problem Relation Age of Onset  . Cancer - Prostate Father   . CAD Mother     History   Social History  . Marital Status: Widowed    Spouse Name: N/A    Number of Children: 8  . Years of Education: N/A   Occupational History  . Not on file.   Social History Main Topics  . Smoking status: Never Smoker   . Smokeless tobacco: Not on file  . Alcohol Use: No  . Drug Use: No  . Sexually Active: Not on file   Other Topics Concern  . Not on file   Social History Narrative   Widow.  Lives with daughter.    Current Facility-Administered Medications  Medication Dose Route Frequency Provider Last Rate Last Dose  . 0.9 %  sodium chloride infusion  250 mL Intravenous PRN Othella Boyer, MD      . acetaminophen (TYLENOL) tablet 650 mg  650 mg Oral Q4H PRN Othella Boyer, MD   650 mg at 10/14/12 1028  . ALPRAZolam Prudy Feeler) tablet 0.25 mg  0.25 mg Oral BID PRN Othella Boyer, MD   0.25 mg at 10/14/12 1028  . amLODipine (NORVASC) tablet 5 mg  5 mg Oral Q24H Quintella Reichert, MD   5 mg at 10/15/12 2158  . aspirin tablet 325 mg  325 mg Oral Daily Othella Boyer, MD   325 mg at 10/16/12 0936  . atenolol (TENORMIN) tablet 25 mg  25 mg Oral Daily Othella Boyer, MD   25 mg at 10/16/12 0936  . enoxaparin (LOVENOX) injection 30 mg  30 mg Subcutaneous Q24H Othella Boyer, MD   30 mg at 10/15/12 1738  . hydrALAZINE (APRESOLINE) injection 10 mg  10 mg Intravenous Q4H PRN Dolores Patty, MD   10 mg at 10/13/12 1942  . irbesartan (AVAPRO) tablet 150 mg  150 mg Oral Daily Othella Boyer, MD   150 mg at 10/16/12 0936  . nitroGLYCERIN (NITROSTAT) SL tablet 0.4 mg  0.4 mg Sublingual Q5 min PRN Richardean Canal, MD      . ondansetron [facility]) injection 4 mg  4 mg Intravenous  Q6H PRN Othella Boyer, MD      . pantoprazole (PROTONIX) EC tablet 40 mg  40 mg Oral Daily Othella Boyer, MD   40 mg at 10/16/12 0935  . sertraline (ZOLOFT) tablet 100 mg  100 mg Oral Daily Othella Boyer, MD   100 mg at 10/16/12 0936  . sodium chloride 0.9 % injection 3 mL  3 mL Intravenous Q12H Othella Boyer, MD   3 mL at 10/16/12 1000  . sodium chloride 0.9 % injection 3 mL  3 mL Intravenous PRN Othella Boyer, MD        Allergies  Allergen Reactions  . Penicillins Itching and Rash  . Sulfa Antibiotics Itching and Rash      Review of Systems:   General:  decreased appetite, decreased energy, no weight gain, + weight loss, no fever  Cardiac:  + chest pain with exertion, + chest pain at rest, + SOB with minimal exertion, + occasional resting SOB, + PND, + orthopnea, no palpitations, no arrhythmia, no atrial fibrillation, no LE edema, no dizzy spells, no syncope  Respiratory:  + shortness of breath, no home oxygen, no productive cough, + dry cough, no bronchitis, no wheezing, no hemoptysis, no asthma, no pain with inspiration or cough, no sleep apnea, no CPAP at night  GI:   + difficulty swallowing, no reflux, no frequent heartburn, no hiatal hernia, no abdominal pain, no constipation, no diarrhea, no hematochezia, no hematemesis, no melena  GU:   no dysuria,  no frequency, no urinary tract infection, no hematuria, no kidney stones, + mild kidney disease  Vascular:  no pain suggestive of claudication, no pain in feet, + leg cramps, no varicose veins, no DVT, no non-healing foot ulcer  Neuro:   + stroke, no TIA's, no seizures, no headaches, no temporary blindness one eye,  no slurred speech, no peripheral neuropathy, + mild chronic pain in kness, + instability of gait, + mild memory/cognitive dysfunction  Musculoskeletal: + arthritis, no joint swelling, no myalgias, + mild difficulty walking, limited mobility   Skin:   no rash, no itching, no skin infections, no pressure sores or  ulcerations  Psych:   no anxiety, no depression, no nervousness, no unusual recent stress  Eyes:   + blurry vision, no floaters, + recent vision changes  ENT:   + severe hearing loss, edentulous, + full set dentures,   Hematologic:  + easy bruising, no abnormal bleeding, no clotting disorder, no frequent epistaxis  Endocrine:  no diabetes, does not check CBG's at home           Physical Exam:   BP 146/57  Pulse 58  Temp(Src) 97.5 F (36.4 C) (Oral)  Resp 18  Ht 4\' 11"  (1.499 m)  Wt 60.011 kg (132 lb 4.8 oz)  BMI 26.71 kg/m2  SpO2 99%  General:  Elderly and frail-appearing  HEENT:  Unremarkable   Neck:   no JVD, no bruits, no adenopathy   Chest:   clear to auscultation, symmetrical breath sounds, no wheezes, no rhonchi   CV:   RRR, grade IV/VI crescendo/decrescendo murmur heard best at RSB,  no diastolic murmur  Abdomen:  soft, non-tender, no masses   Extremities:  warm, well-perfused, pulses diminished, no LE edema  Rectal/GU  Deferred  Neuro:   Grossly non-focal and symmetrical throughout  Skin:   Clean and dry, no rashes, no breakdown   Diagnostic Tests:   Transthoracic Echocardiography  Patient: Kaylee Schmitt, Kaylee Schmitt MR #: [id number] Study Date: 10/12/2012 Gender: F Age: 27 Height: Weight: BSA: Pt. Status: Room: D36C  ADMITTING Armanda Magic, MD ATTENDING Armanda Magic, MD PERFORMING [facility], Ec SONOGRAPHER Jeryl Columbia Romero Liner cc:  ------------------------------------------------------------ LV EF: 60% - 65%  ------------------------------------------------------------ History: PMH: Aortic Stenosis AS Coronary artery disease. Congestive heart failure. Stroke. Risk factors: Dyslipidemia.  ------------------------------------------------------------ Study Conclusions  - Left ventricle: The cavity size was normal. Wall thickness was increased in a pattern of severe LVH. Systolic function was normal. The estimated ejection  fraction was in the range of 60% to 65%. Wall motion was normal; there were no regional wall motion abnormalities. - Aortic valve: Valve mobility was severely restricted. There was severe stenosis. Mild regurgitation. Valve area: 0.53cm^2(VTI). Valve area: 0.47cm^2 (Vmax). - Mitral valve: Severely calcified annulus. Moderately calcified leaflets . - Left atrium: The atrium was moderately to severely  dilated. - Pulmonary arteries: Systolic pressure was moderately increased. Transthoracic echocardiography. M-mode, complete 2D, spectral Doppler, and color Doppler. Blood pressure: 165/60. Patient status: Inpatient. Location: Bedside.  ------------------------------------------------------------  ------------------------------------------------------------ Left ventricle: The cavity size was normal. Wall thickness was increased in a pattern of severe LVH. Systolic function was normal. The estimated ejection fraction was in the range of 60% to 65%. Wall motion was normal; there were no regional wall motion abnormalities.  ------------------------------------------------------------ Aortic valve: Trileaflet; severely thickened, severely calcified leaflets. Valve mobility was severely restricted. Doppler: There was severe stenosis. Mild regurgitation. VTI ratio of LVOT to aortic valve: 0.17. Valve area: 0.53cm^2(VTI). Peak velocity ratio of LVOT to aortic valve: 0.15. Valve area: 0.47cm^2 (Vmax). Mean gradient: 38mm Hg (S). Peak gradient: 61mm Hg (S).  ------------------------------------------------------------ Aorta: Aortic root: The aortic root was normal in size.  ------------------------------------------------------------ Mitral valve: Submitral structures heavily calcified. Severely calcified annulus. Moderately calcified leaflets . Mobility was not restricted. Doppler: Transvalvular velocity was within the normal range. There was no evidence for stenosis. No  regurgitation.  ------------------------------------------------------------ Left atrium: The atrium was moderately to severely dilated.  ------------------------------------------------------------ Right ventricle: The cavity size was normal. Wall thickness was normal. Systolic function was normal.  ------------------------------------------------------------ Pulmonic valve: Not well visualized. Doppler: Transvalvular velocity was within the normal range. There was no evidence for stenosis.  ------------------------------------------------------------ Tricuspid valve: Structurally normal valve. Doppler: Transvalvular velocity was within the normal range. Mild regurgitation.  ------------------------------------------------------------ Pulmonary artery: Systolic pressure was moderately increased.  ------------------------------------------------------------ Right atrium: The atrium was normal in size.  ------------------------------------------------------------ Pericardium: There was no pericardial effusion.  ------------------------------------------------------------ Systemic veins: Inferior vena cava: The vessel was normal in size.  ------------------------------------------------------------  2D measurements Normal Doppler measurements Normal Left ventricle LVOT LVID ED, 29.3 mm 43-52 Peak vel, S 58 cm/s ------ chord, PLAX VTI, S 18.9 cm ------ LVID ES, 22.4 mm 23-38 Aortic valve chord, PLAX Peak vel, S 390 cm/s ------ FS, chord, 24 % >29 Mean vel, S 294 cm/s ------ PLAX VTI, S 111 cm ------ LVPW, ED 16.9 mm ------ Mean 38 mm ------ IVS/LVPW 0.82 <1.3 gradient, S Hg ratio, ED Peak 61 mm ------ Ventricular septum gradient, S Hg IVS, ED 13.9 mm ------ VTI ratio 0.17 ------ LVOT LVOT/AV Diam, S 20 mm ------ Area, VTI 0.53 cm^2 ------ Area 3.14 cm^2 ------ Peak vel 0.15 ------ Aorta ratio, Root diam, 28 mm ------ LVOT/AV ED Area, Vmax 0.47 cm^2 ------ Regurg PHT  281 ms ------ Tricuspid valve Regurg peak 351 cm/s ------ vel Peak RV-RA 49 mm ------ gradient, S Hg  ------------------------------------------------------------ Prepared and Electronically Authenticated by  Ellwood Handler [id number]   Impression:  The patient has severe symptomatic aortic stenosis with reasonably well-preserved left ventricular systolic function. The patient also has known history of two-vessel coronary artery disease for which she underwent PCI and stenting using bare metal stents in the left anterior descending coronary artery 4 years ago. Symptoms of congestive heart failure have progressed substantially over the last few years, particularly over the last few months leading up to her current hospitalization with acute exacerbation of chronic combined systolic and diastolic CHF, class IV. The patient lives a very sedentary lifestyle with limited physical mobility due to mild left-sided weakness from previous stroke, degenerative arthritis, and severe exertional shortness of breath. She also has very mild dementia, although this does not seem to be very limiting from a practical standpoint. 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 Options include transcatheter aortic valve replacement or continued palliative medical therapy.   Plan:  The patient and her family were counseled at length regarding treatment alternatives for management of severe symptomatic aortic stenosis. Alternative approaches such as conventional aortic valve replacement, transcatheter aortic valve replacement, and palliative medical therapy were compared and contrasted at length.  The risks associated with transcatheter aortic valve replacement were been discussed in detail, as were  expectations for post-operative convalescence. Long-term prognosis with medical therapy was discussed. This discussion was placed in the context of the patient's own specific clinical presentation and past medical history.  All of their questions been addressed.  If the patient chooses to consider an aggressive treatment strategy, the next step would be to proceed with left and right heart catheterization. This could potentially be performed during this hospitalization. Depending upon findings at catheterization, plans for subsequent testing related to possible transcatheter aortic valve replacement could be scheduled as an outpatient. Alternatively, if the patient and her family are more inclined to stick with palliative medical therapy and avoid any type of high-risk intervention, I would not favor proceeding with cardiac catheterization at this time. We will continue to follow and assist as needed with the discussion.     Salvatore Decent. Cornelius Moras, MD 10/16/2012 7:09 PM

## 2012-10-17 ENCOUNTER — Other Ambulatory Visit: Payer: Self-pay | Admitting: *Deleted

## 2012-10-17 DIAGNOSIS — I5023 Acute on chronic systolic (congestive) heart failure: Secondary | ICD-10-CM

## 2012-10-17 DIAGNOSIS — I359 Nonrheumatic aortic valve disorder, unspecified: Secondary | ICD-10-CM

## 2012-10-17 LAB — BASIC METABOLIC PANEL
BUN: 36 mg/dL — ABNORMAL HIGH (ref 6–23)
BUN: 36 mg/dL — ABNORMAL HIGH (ref 6–23)
CO2: 27 mEq/L (ref 19–32)
CO2: 26 mEq/L (ref 19–32)
Calcium: 9.7 mg/dL (ref 8.4–10.5)
Chloride: 97 mEq/L (ref 96–112)
Creatinine, Ser: 1.34 mg/dL — ABNORMAL HIGH (ref 0.50–1.10)
GFR calc non Af Amer: 35 mL/min — ABNORMAL LOW (ref >90)
Glucose, Bld: 108 mg/dL — ABNORMAL HIGH (ref 70–99)
Glucose, Bld: 122 mg/dL — ABNORMAL HIGH (ref 70–99)
Potassium: 4.2 mEq/L (ref 3.5–5.1)

## 2012-10-17 LAB — CBC
HCT: 30.6 % — ABNORMAL LOW (ref 36.0–46.0)
Hemoglobin: 10.2 g/dL — ABNORMAL LOW (ref 12.0–15.0)
MCH: 28.8 pg (ref 26.0–34.0)
MCHC: 33.3 g/dL (ref 30.0–36.0)
RBC: 3.54 MIL/uL — ABNORMAL LOW (ref 3.87–5.11)

## 2012-10-17 MED ORDER — ASPIRIN 81 MG PO CHEW
324.0000 mg | CHEWABLE_TABLET | ORAL | Status: AC
  Administered 2012-10-18: 324 mg via ORAL
  Filled 2012-10-17: qty 4

## 2012-10-17 MED ORDER — SODIUM CHLORIDE 0.9 % IJ SOLN
3.0000 mL | Freq: Two times a day (BID) | INTRAMUSCULAR | Status: DC
  Administered 2012-10-17 – 2012-10-18 (×2): 3 mL via INTRAVENOUS

## 2012-10-17 MED ORDER — SODIUM CHLORIDE 0.9 % IV SOLN: 250.0000 mL | INTRAVENOUS | Status: DC | PRN

## 2012-10-17 MED ORDER — SODIUM CHLORIDE 0.9 % IJ SOLN: 3.0000 mL | INTRAMUSCULAR | Status: DC | PRN

## 2012-10-17 MED ORDER — SODIUM CHLORIDE 0.9 % IV SOLN: INTRAVENOUS | Status: DC

## 2012-10-17 NOTE — Progress Notes (Signed)
Discussed plan with patient's daughter.  Kaylee Schmitt and her daughter have decided to proceed with TAVR procedure.  I will set her up for right and left heart cath tomorrow.  Cardiac catheterization was discussed with the patient fully including risks on myocardial infarction, death, stroke, bleeding, arrhythmia, dye allergy, renal insufficiency or bleeding.  All patient questions and concerns were discussed and the patient understands and is willing to proceed.   I will hold her Ibersartan due to renal insuff and will not give preop fluids to avoid Acute CHF.

## 2012-10-17 NOTE — Progress Notes (Addendum)
At 0045 pt had a brief run of non sustained SVT while sleeping.  Pt asymptomatic---strips saved at monitor station and posted on chart. Will continue to monitor. Dierdre Highman, RN

## 2012-10-17 NOTE — Progress Notes (Signed)
Pt has decided to proceed with planning for TAVR. See full consults yesterday from myself and Dr. Cornelius Moras. Plans for right and left heart cath tomorrow with Dr. Mayford Knife. Would attempt to cross aortic valve if possible to get direct pressure assessment of gradient across valve. We will follow.   MCALHANY,CHRISTOPHER 12:02 PM 10/17/2012

## 2012-10-17 NOTE — Progress Notes (Signed)
SUBJECTIVE:  No complaints this am  OBJECTIVE:   Vitals:   Filed Vitals:   10/16/12 1418 10/16/12 2002 10/16/12 2040 10/17/12 0415  BP: 146/57 137/38 150/69 153/72  Pulse: 58 69 61 75  Temp: 97.5 F (36.4 C) 98.4 F (36.9 C) 98.2 F (36.8 C) 98.6 F (37 C)  TempSrc: Oral Oral  Oral  Resp: 18 18  18   Height:      Weight:    61.508 kg (135 lb 9.6 oz)  SpO2: 99% 100% 94% 94%   I&O's:   Intake/Output Summary (Last 24 hours) at 10/17/12 0843 Last data filed at 10/17/12 0418  Gross per 24 hour  Intake    360 ml  Output    600 ml  Net   -240 ml   TELEMETRY: Reviewed telemetry pt in NSR:     PHYSICAL EXAM General: Well developed, well nourished, in no acute distress Head: Eyes PERRLA, No xanthomas.   Normal cephalic and atramatic  Lungs:   Clear bilaterally to auscultation and percussion. Heart:   RRR S1 S2 Pulses are 2+ & equal. 2/6 SM at RUSB Abdomen: Bowel sounds are positive, abdomen soft and non-tender without masses Extremities:   No clubbing, cyanosis or edema.  DP +1 Neuro: Alert and oriented X 3. Psych:  Good affect, responds appropriately   LABS: Basic Metabolic Panel:  Recent Labs  16/10/96 1500 10/17/12 0443  NA 134* 134*  K 3.7 4.2  CL 96 99  CO2 26 27  GLUCOSE 113* 108*  BUN 38* 36*  CREATININE 1.46* 1.33*  CALCIUM 9.6 9.4   Anemia Panel: No results found for this basename: VITAMINB12, FOLATE, FERRITIN, TIBC, IRON, RETICCTPCT,  in the last 72 hours Coag Panel:   Lab Results  Component Value Date   INR 1.13 01/20/2010    RADIOLOGY: Dg Chest 2 View  10/16/2012   *RADIOLOGY REPORT*  Clinical Data: Shortness of breath  CHEST - 2 VIEW  Comparison: 10/12/2012; 09/14/2028; 01/20/2010  Findings: Grossly unchanged enlarged cardiac silhouette and mediastinal contours with atherosclerotic calcifications within the thoracic aorta. Calcifications within the mitral valve annulus. Overall improved aeration of the lungs.  Improved aeration of the bilateral  lung bases with persistent minimal bibasilar opacities favored to represent atelectasis or scar. No pleural effusion or pneumothorax.  No acute osseous abnormalities.  Post cholecystectomy.  IMPRESSION: Improved/resolved pulmonary edema without acute cardiopulmonary disease.   Original Report Authenticated By: Tacey Ruiz, MD   Dg Chest Port 1 View  10/12/2012   *RADIOLOGY REPORT*  Clinical Data: Chest pain. Shortness of breath.  Stroke. Congestive heart failure.  PORTABLE CHEST - 1 VIEW  Comparison: 09/15/2011  Findings: Cardiomegaly noted with perihilar and left basilar airspace opacities which are increased compared to prior.  Atherosclerotic calcification of the aortic arch is noted.  Kerley B lines are present and increased from prior exam.  IMPRESSION:  1.  Perihilar and left basilar airspace opacities with Kerley B lines, favoring edema over multilobar pneumonia. 2.  Cardiomegaly.   Original Report Authenticated By: Gaylyn Rong, M.D.    Assessment/Plan:  1. Acute diastolic CHF - improved although patient has decreased BS at right base. Currently diuretics are on hold due to renal insufficiency  2. Critical aortic stenosis with procelain aorta - deemed not a surgical candidate -patient has been evaluated in [place] at [facility] in the past for TAVR. Of note patient told Dr. Donnie Aho that she never heard from [facility] but actually she was contacted several times and  also offered a second opinion at [facility] and declined procedure. At last OV we discussed Hospice given her symptoms of SOB but she declined.  3. CAD with 80% LAD s/p PCI x 2 2011  4. Anemia  5. Acute on chronic renal insufficiency - improved after stopping aldacaton 6. HTN - good control   - Options for further management of AS discussed at length with family by Dr. Cornelius Moras. Options include TAVR vs. Medical therapy with Palliative Care.  The patient at this time would like to proceed with the procedure but the daughter is unsure. I will talk  with the daughter today and if they decide to proceed, then will set up right and left heart cath for Friday before discharge home.     Quintella Reichert, MD  10/17/2012  8:43 AM

## 2012-10-18 ENCOUNTER — Encounter (HOSPITAL_COMMUNITY)
Admission: EM | Disposition: A | Discharge status: Home / Self Care | Payer: Self-pay | Source: Home / Self Care | Attending: Cardiology

## 2012-10-18 ENCOUNTER — Inpatient Hospital Stay (HOSPITAL_COMMUNITY): Payer: Medicare Other

## 2012-10-18 HISTORY — PX: LEFT AND RIGHT HEART CATHETERIZATION WITH CORONARY ANGIOGRAM: SHX5449

## 2012-10-18 LAB — CREATININE, SERUM
Creatinine, Ser: 1.43 mg/dL — ABNORMAL HIGH (ref 0.50–1.10)
GFR calc non Af Amer: 32 mL/min — ABNORMAL LOW (ref >90)

## 2012-10-18 LAB — BASIC METABOLIC PANEL
BUN: 37 mg/dL — ABNORMAL HIGH (ref 6–23)
CO2: 25 mEq/L (ref 19–32)
Chloride: 99 mEq/L (ref 96–112)
Creatinine, Ser: 1.4 mg/dL — ABNORMAL HIGH (ref 0.50–1.10)
GFR calc Af Amer: 38 mL/min — ABNORMAL LOW (ref >90)
Glucose, Bld: 106 mg/dL — ABNORMAL HIGH (ref 70–99)

## 2012-10-18 LAB — CBC
Hemoglobin: 9.5 g/dL — ABNORMAL LOW (ref 12.0–15.0)
RBC: 3.24 MIL/uL — ABNORMAL LOW (ref 3.87–5.11)

## 2012-10-18 LAB — POCT ACTIVATED CLOTTING TIME: Activated Clotting Time: 165 seconds

## 2012-10-18 LAB — POCT I-STAT 3, VENOUS BLOOD GAS (G3P V)
pCO2, Ven: 39.3 mmHg — ABNORMAL LOW (ref 45.0–50.0)
pH, Ven: 7.351 — ABNORMAL HIGH (ref 7.250–7.300)

## 2012-10-18 LAB — POCT I-STAT 3, ART BLOOD GAS (G3+)
Bicarbonate: 21.6 mEq/L (ref 20.0–24.0)
pH, Arterial: 7.41 (ref 7.350–7.450)
pO2, Arterial: 62 mmHg — ABNORMAL LOW (ref 80.0–100.0)

## 2012-10-18 LAB — PULMONARY FUNCTION TEST

## 2012-10-18 SURGERY — LEFT AND RIGHT HEART CATHETERIZATION WITH CORONARY ANGIOGRAM

## 2012-10-18 MED ORDER — SODIUM CHLORIDE 0.9 % IV SOLN
INTRAVENOUS | Status: DC
  Administered 2012-10-18: 12:00:00 via INTRAVENOUS

## 2012-10-18 MED ORDER — SODIUM CHLORIDE 0.9 % IV SOLN: INTRAVENOUS | Status: AC

## 2012-10-18 MED ORDER — ENOXAPARIN SODIUM 30 MG/0.3ML ~~LOC~~ SOLN
30.0000 mg | SUBCUTANEOUS | Status: DC
  Administered 2012-10-19: 30 mg via SUBCUTANEOUS
  Filled 2012-10-18 (×2): qty 0.3

## 2012-10-18 MED ORDER — FENTANYL CITRATE 0.05 MG/ML IJ SOLN
INTRAMUSCULAR | Status: AC
  Filled 2012-10-18: qty 2

## 2012-10-18 MED ORDER — LIDOCAINE HCL (PF) 1 % IJ SOLN
INTRAMUSCULAR | Status: AC
  Filled 2012-10-18: qty 30

## 2012-10-18 MED ORDER — NITROGLYCERIN 0.2 MG/ML ON CALL CATH LAB
INTRAVENOUS | Status: AC
  Filled 2012-10-18: qty 1

## 2012-10-18 MED ORDER — MIDAZOLAM HCL 2 MG/2ML IJ SOLN
INTRAMUSCULAR | Status: AC
  Filled 2012-10-18: qty 2

## 2012-10-18 MED ORDER — HEPARIN (PORCINE) IN NACL 2-0.9 UNIT/ML-% IJ SOLN
INTRAMUSCULAR | Status: AC
  Filled 2012-10-18: qty 1000

## 2012-10-18 MED ORDER — HEPARIN SODIUM (PORCINE) 1000 UNIT/ML IJ SOLN
INTRAMUSCULAR | Status: AC
  Filled 2012-10-18: qty 1

## 2012-10-18 NOTE — Progress Notes (Signed)
SUBJECTIVE:  No complaints of CP or SOB  OBJECTIVE:   Vitals:   Filed Vitals:   10/17/12 0943 10/17/12 1415 10/17/12 2110 10/18/12 0605  BP: 179/65 133/70 160/58 153/55  Pulse: 68 60 65 55  Temp:  98.7 F (37.1 C) 99.4 F (37.4 C) 99.1 F (37.3 C)  TempSrc:  Oral Oral Oral  Resp:  17 18 18   Height:      Weight:    61.009 kg (134 lb 8 oz)  SpO2:  95% 94% 98%   I&O's:   Intake/Output Summary (Last 24 hours) at 10/18/12 0901 Last data filed at 10/18/12 0400  Gross per 24 hour  Intake    840 ml  Output    600 ml  Net    240 ml   TELEMETRY: Reviewed telemetry pt in NSR:     PHYSICAL EXAM General: Well developed, well nourished, in no acute distress Head: Eyes PERRLA, No xanthomas.   Normal cephalic and atramatic  Lungs:   Clear bilaterally to auscultation and percussion. Heart:   HRRR S1 S2 Pulses are 2+ & equal.2/6 SM at RUSB to LLSB Abdomen: Bowel sounds are positive, abdomen soft and non-tender without masses Extremities:   No clubbing, cyanosis or edema.  DP +1 Neuro: Alert and oriented X 3. Psych:  Good affect, responds appropriately   LABS: Basic Metabolic Panel:  Recent Labs  16/10/96 2000 10/18/12 0445  NA 133* 135  K 3.8 4.0  CL 97 99  CO2 26 25  GLUCOSE 122* 106*  BUN 36* 37*  CREATININE 1.34* 1.40*  CALCIUM 9.7 9.6   Liver Function Tests: No results found for this basename: AST, ALT, ALKPHOS, BILITOT, PROT, ALBUMIN,  in the last 72 hours No results found for this basename: LIPASE, AMYLASE,  in the last 72 hours CBC:  Recent Labs  10/17/12 2000  WBC 13.2*  HGB 10.2*  HCT 30.6*  MCV 86.4  PLT 225   Cardiac Enzymes: No results found for this basename: CKTOTAL, CKMB, CKMBINDEX, TROPONINI,  in the last 72 hours BNP: No components found with this basename: POCBNP,  D-Dimer: No results found for this basename: DDIMER,  in the last 72 hours Hemoglobin A1C: No results found for this basename: HGBA1C,  in the last 72 hours Fasting Lipid  Panel: No results found for this basename: CHOL, HDL, LDLCALC, TRIG, CHOLHDL, LDLDIRECT,  in the last 72 hours Thyroid Function Tests: No results found for this basename: TSH, T4TOTAL, FREET3, T3FREE, THYROIDAB,  in the last 72 hours Anemia Panel: No results found for this basename: VITAMINB12, FOLATE, FERRITIN, TIBC, IRON, RETICCTPCT,  in the last 72 hours Coag Panel:   Lab Results  Component Value Date   INR 1.14 10/17/2012   INR 1.13 01/20/2010    RADIOLOGY: Dg Chest 2 View  10/16/2012   *RADIOLOGY REPORT*  Clinical Data: Shortness of breath  CHEST - 2 VIEW  Comparison: 10/12/2012; 09/14/2028; 01/20/2010  Findings: Grossly unchanged enlarged cardiac silhouette and mediastinal contours with atherosclerotic calcifications within the thoracic aorta. Calcifications within the mitral valve annulus. Overall improved aeration of the lungs.  Improved aeration of the bilateral lung bases with persistent minimal bibasilar opacities favored to represent atelectasis or scar. No pleural effusion or pneumothorax.  No acute osseous abnormalities.  Post cholecystectomy.  IMPRESSION: Improved/resolved pulmonary edema without acute cardiopulmonary disease.   Original Report Authenticated By: Tacey Ruiz, MD   Dg Chest Port 1 View  10/12/2012   *RADIOLOGY REPORT*  Clinical Data: Chest  pain. Shortness of breath.  Stroke. Congestive heart failure.  PORTABLE CHEST - 1 VIEW  Comparison: 09/15/2011  Findings: Cardiomegaly noted with perihilar and left basilar airspace opacities which are increased compared to prior.  Atherosclerotic calcification of the aortic arch is noted.  Kerley B lines are present and increased from prior exam.  IMPRESSION:  1.  Perihilar and left basilar airspace opacities with Kerley B lines, favoring edema over multilobar pneumonia. 2.  Cardiomegaly.   Original Report Authenticated By: Gaylyn Rong, M.D.    Assessment/Plan:  1. Acute diastolic CHF - improved although patient has decreased  BS at right base. Currently diuretics are on hold due to renal insufficiency  2. Critical aortic stenosis with procelain aorta - deemed not a surgical candidate -patient has been evaluated in [place] at [facility] in the past for TAVR. Of note patient told Dr. Donnie Aho that she never heard from [facility] but actually she was contacted several times and also offered a second opinion at [facility] and declined procedure. At last OV we discussed Hospice given her symptoms of SOB but she declined.  3. CAD with 80% LAD s/p PCI x 2 2011  4. Anemia  5. Acute on chronic renal insufficiency - improved after stopping aldactone 6. HTN - good control   - Options for further management of AS discussed at length with family by Dr. Cornelius Moras. Options include TAVR vs. Medical therapy with Palliative Care. The patient at this time would like to proceed with the procedure.  Will proceed with right and left heart cath today to evaluate coronary artery patency and assess right heart pressures.    Quintella Reichert, MD  10/18/2012  9:01 AM

## 2012-10-18 NOTE — H&P (Signed)
The patient is a very pleasant elderly female who appears pale and fatigued. Her family is present in the room. We discussed the indication for left and right heart catheterization with hemodynamic recordings. She is significantly hard of hearing. I discussed the procedure and anticipated risks which included less than 1% risk of stroke, death, myocardial infarction, and a higher risk bleeding, contrast allergy, limb ischemia, kidney injury, infection, etc. The patient understands these risks and is willing to proceed.  Also noted is an increasing creatinine to 1.4 from 1.35. We'll plan gentle hydration until the procedure. We will minimize contrast.

## 2012-10-18 NOTE — CV Procedure (Addendum)
     Diagnostic Cardiac Catheterization Report  Kaylee Schmitt  79 y.o.  female [birth year]  Procedure Date: 10/18/2012 Referring Physician: Armanda Magic, MD / Ashley Mariner, MD / C. McAlhaney MD Primary Cardiologist:: Kym Groom   PROCEDURE:  Left heart catheterization with selective coronary angiography, left ventriculogram.  INDICATIONS:  Severe aortic stenosis with limiting symptoms in whom left and right heart cath with hemodynamic valve assessment is requested as a possible prelude to TAVR.  The risks, benefits, and details of the procedure were explained to the patient.  The patient verbalized understanding and wanted to proceed.  Informed written consent was obtained.  PROCEDURE TECHNIQUE:  After Xylocaine anesthesia a 5 French sheath was placed in the right femoral artery with a single anterior needle wall stick.  The right femoral vein was more difficult to cannulate but after multiple attempts was identified with a SmartNeedle. 7 French sheath was placed. Left heart catheterization and coronary angiography was performed using 5 French 4 cm left and right Judkins catheters.  The Judkins right catheter was used to cross the aortic valve using a 300 cm long straight-tip 0.036 guidewire. A total of 2000 units of heparin was given prior to attempting to cross the valve. Left ventriculography was done with the 5 [place] JR 4 using hand injection .    Right heart catheterization was performed using a Swan-Ganz catheter. A right atrial loop was required to enter the main pulmonary artery. A main pulmonary artery O2 saturation was obtained. Thermodilution cardiac outputs were also performed.  No complications.   CONTRAST:  Total of 70 cc.  COMPLICATIONS:  None.    HEMODYNAMICS:  Aortic pressure was 143/38 mmHg; LV pressure was 191/4 mmHg; LVEDP 13 mm mercury; right atrial mean 2 mmHg; right ventricular pressure 47/5 mmHg; pulmonary artery pressure 44/16 mmHg; capillary wedge pressure mean 9 mm  mercury; Fick cardiac output 4.3 L per minute; thermodilution cardiac output 3.6 L per minute; peak to peak aortic valve gradient 48 mmHg; aortic valve area 0.84 cm square (Fick) and 0.69 cm square (Thermo dilution).    ANGIOGRAPHIC DATA:   The left main coronary artery is calcified but widely patent..  The left anterior descending artery is transapical. There is proximal calcification with 30-40% narrowing noted. There is 30-50% in-stent restenosis within the stented region proximally. Luminal irregularities are noted in the mid and distal LAD. The first diagonal is patent despite being jailed..  The left circumflex artery is dominant. Luminal irregularities are noted. 3 obtuse marginal branches and the PDA arises distally. Tortuosity is seen throughout the circumflex and its branches. No significant obstruction is noted..  The right coronary artery is nondominant/codominant and contains 50% ostial narrowing. No catheter damping noted. The mid to proximal RCA contains 30-40% narrowing to  LEFT VENTRICULOGRAM:  Left ventricular angiogram was done in the 30 RAO projection and revealed normal left ventricular wall motion and systolic function with an estimated ejection fraction of 65 %.    IMPRESSIONS:  1. Calcific aortic stenosis, critical  2. Widely patent native coronary arteries  3. Moderate pulmonary hypertension  4. Heavily calcified/porcelain aorta along with heavy aortic valve and mitral annular calcification  5. Normal left ventricular systolic function with normal filling pressures of the time of cath   RECOMMENDATION:  1. IV hydration  2. Further evaluation and management per Dr. Mayford Knife and the heart valve team.

## 2012-10-18 NOTE — Progress Notes (Signed)
Pt arrived back to floor at 1540 from cath lab, I assessed groin and noted a hematoma to the right groin.  I stated this to Jannet Mantis, RN who was at the bedside, she then held pressure.  I notified Dr. Katrinka Blazing.  Will continue to monitor site frequently.

## 2012-10-18 NOTE — Progress Notes (Signed)
PFT completed. PT unable to perform DLCO despite several attempts. Unconfirmed report placed in Progress Notes of [facility].

## 2012-10-19 LAB — BASIC METABOLIC PANEL
Calcium: 9.7 mg/dL (ref 8.4–10.5)
GFR calc non Af Amer: 35 mL/min — ABNORMAL LOW (ref >90)
Sodium: 136 mEq/L (ref 135–145)

## 2012-10-19 MED ORDER — ASPIRIN EC 81 MG PO TBEC: 81.0000 mg | DELAYED_RELEASE_TABLET | Freq: Every day | ORAL | Status: DC

## 2012-10-19 MED ORDER — FUROSEMIDE 40 MG PO TABS: 20.0000 mg | ORAL_TABLET | Freq: Every day | ORAL | Status: DC

## 2012-10-19 NOTE — Progress Notes (Signed)
The patient will be contacted by Darius Bump, TAVR Coordinator for f/u appt in Multi-disciplinary valve clinic. OK to discharge today.

## 2012-10-19 NOTE — Discharge Summary (Signed)
Patient ID: Kaylee Schmitt MRN: [id number] DOB/AGE: 1924-02-13 79 y.o.  Admit date: 10/12/2012 Discharge date: 10/19/2012  Primary Discharge Diagnosis  Acute diastolic CHF secondary to severe AS Secondary Discharge Diagnosis  Severe Aortic stenosis  CAD s/p PCI of LAD 2011  History of CVA 2003  Gout  HTN  dyslipidemia    Significant Diagnostic Studies: angiography:  Diagnostic Cardiac Catheterization Report  Kaylee Schmitt  79 y.o.  female  1923/11/23  Procedure Date: 10/18/2012  Referring Physician: Armanda Magic, MD / Ashley Mariner, MD / C. McAlhaney MD  Primary Cardiologist:: Kym Groom  PROCEDURE: Left heart catheterization with selective coronary angiography, left ventriculogram.  INDICATIONS: Severe aortic stenosis with limiting symptoms in whom left and right heart cath with hemodynamic valve assessment is requested as a possible prelude to TAVR.  The risks, benefits, and details of the procedure were explained to the patient. The patient verbalized understanding and wanted to proceed. Informed written consent was obtained.  PROCEDURE TECHNIQUE: After Xylocaine anesthesia a 5 French sheath was placed in the right femoral artery with a single anterior needle wall stick. The right femoral vein was more difficult to cannulate but after multiple attempts was identified with a SmartNeedle. 7 French sheath was placed. Left heart catheterization and coronary angiography was performed using 5 French 4 cm left and right Judkins catheters. The Judkins right catheter was used to cross the aortic valve using a 300 cm long straight-tip 0.036 guidewire. A total of 2000 units of heparin was given prior to attempting to cross the valve. Left ventriculography was done with the 5 [place] JR 4 using hand injection .  Right heart catheterization was performed using a Swan-Ganz catheter. A right atrial loop was required to enter the main pulmonary artery. A main pulmonary artery O2 saturation was obtained.  Thermodilution cardiac outputs were also performed.  No complications.  CONTRAST: Total of 70 cc.  COMPLICATIONS: None.  HEMODYNAMICS: Aortic pressure was 143/38 mmHg; LV pressure was 191/4 mmHg; LVEDP 13 mm mercury; right atrial mean 2 mmHg; right ventricular pressure 47/5 mmHg; pulmonary artery pressure 44/16 mmHg; capillary wedge pressure mean 9 mm mercury; Fick cardiac output 4.3 L per minute; thermodilution cardiac output 3.6 L per minute; peak to peak aortic valve gradient 48 mmHg; aortic valve area 0.84 cm square (Fick) and 0.69 cm square (Thermo dilution).. There was no gradient between the left ventricle and aorta.  ANGIOGRAPHIC DATA: The left main coronary artery is calcified but widely patent..  The left anterior descending artery is transapical. There is proximal calcification with 30-40% narrowing noted. There is 30-50% in-stent restenosis within the stented region proximally. Luminal irregularities are noted in the mid and distal LAD. The first diagonal is patent despite being jailed..  The left circumflex artery is dominant. Luminal irregularities are noted. 3 obtuse marginal branches and the PDA arises distally. Tortuosity is seen throughout the circumflex and its branches. No significant obstruction is noted..  The right coronary artery is nondominant/codominant and contains 50% ostial narrowing. No catheter damping noted. The mid to proximal RCA contains 30-40% narrowing to  LEFT VENTRICULOGRAM: Left ventricular angiogram was done in the 30 RAO projection and revealed normal left ventricular wall motion and systolic function with an estimated ejection fraction of 65 %.  IMPRESSIONS: 1. Calcific aortic stenosis, critical  2. Widely patent native coronary arteries  3. Moderate pulmonary hypertension  4. Heavily calcified/porcelain aorta along with heavy aortic valve and mitral palate calcification  5. Normal left ventricular systolic function with  normal filling pressures of the time  of cath  RECOMMENDATION: 1. IV hydration  2. Further evaluation and management per Dr. Mayford Knife and the heart valve team.        Consults: Cardiothoracic surgery  Hospital Course: This 79 year old female has a known history of critical aortic stenosis. 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 At the time she was turned down for valve replacement surgery due to the porcelain aorta and had stenting with 2 non-drug-eluting stents to the LAD by Dr. Amil Amen for an 80% mid LAD stenosis.  She was evaluated at [facility] for percutaneous aortic valve replacement but decided not to follow through at that time . She currently lives with her daughter and is basically leading a sedentary existence. She is severely dyspneic when she gets out of walks and does have some intermittent chest pressure. She goes around in a wheelchair but is significantly limited with dyspnea on exertion. She had several days of PND and orthopnea and had difficulty sleeping at night as well as mid sternal chest discomfort and tightness. She came to the emergency room  where she was found to be in acute congestive heart failure and is brought in for further evaluation of heart failure in the setting of critical aortic stenosis. She was diuresed and her CHF resolved.  CVTS surgery and Dr. Sherlynn Stalls were consulted for consideration of TAVR.  After both spoke at length to patient about the procedure and risks the family decided they wanted to proceed.  She underwent cardiac cath on 7/10 showing  patent native coronary arteries with nonobstructive ASCAD of the LAD with 30-50% instent restenosis of the prox LAD stent, 30-40% stenosis of the mid to prox RCA and 50% ostial RCA, critical aortic stenosis with peak to peak AV gradient and AVA by Fick 0.84cm2.  She has mild pulmonary HTN with PASP 44/2mmHg and PCW .  Her cath was complicated by a groin hematoma which resolved without any  further sequelae.  She did well post cath and on day of discharge was ambulating with a walker.  She will be set up in valve clinic for TAVR by Dr. Excell Seltzer and Dr. Cornelius Moras.   Discharge Exam: Blood pressure 140/44, pulse 69, temperature 98.2 F (36.8 C), temperature source Oral, resp. rate 18, height 4\' 11"  (1.499 m), weight 62.324 kg (137 lb 6.4 oz), SpO2 98.00%.   General appearance: alert Resp: clear to auscultation bilaterally Cardio: regular rate and rhythm, S1, S2 normal, no murmur, click, rub or gallop with 2/6 SM at RUSB radiating to LLSB GI: soft, non-tender; bowel sounds normal; no masses,  no organomegaly Extremities: extremities normal, atraumatic, no cyanosis or edema Labs:   Lab Results  Component Value Date   WBC 11.3* 10/18/2012   HGB 9.5* 10/18/2012   HCT 28.1* 10/18/2012   MCV 86.7 10/18/2012   PLT 208 10/18/2012    Recent Labs Lab 10/12/12 1300  10/19/12 0628  NA 140  < > 136  K 3.2*  < > 3.6  CL 103  < > 102  CO2 25  < > 22  BUN 21  < > 38*  CREATININE 1.12*  < > 1.32*  CALCIUM 9.3  < > 9.7  PROT 6.3  --   --   BILITOT 1.4*  --   --   ALKPHOS 83  --   --   ALT 7  --   --   AST 13  --   --  GLUCOSE 113*  < > 95  < > = values in this interval not displayed. Lab Results  Component Value Date   CKTOTAL 52 01/25/2010   CKMB 0.6 01/25/2010   TROPONINI <0.30 10/13/2012        Radiology:  *RADIOLOGY REPORT*  Clinical Data: Shortness of breath  CHEST - 2 VIEW  Comparison: 10/12/2012; 09/14/2028; 01/20/2010  Findings: Grossly unchanged enlarged cardiac silhouette and  mediastinal contours with atherosclerotic calcifications within the  thoracic aorta. Calcifications within the mitral valve annulus.  Overall improved aeration of the lungs. Improved aeration of the  bilateral lung bases with persistent minimal bibasilar opacities  favored to represent atelectasis or scar. No pleural effusion or  pneumothorax. No acute osseous abnormalities. Post   cholecystectomy.  IMPRESSION:  Improved/resolved pulmonary edema without acute cardiopulmonary  disease.  Original Report Authenticated By: Tacey Ruiz, MD  EKG:NSR with RBBB  FOLLOW UP PLANS AND APPOINTMENTS Discharge Orders   Future Orders Complete By Expires     Diet - low sodium heart healthy  As directed     Increase activity slowly  As directed     Lifting restrictions  As directed     Comments:      No lifting more than 10 pounds for 1 week        Medication List    STOP taking these medications       aspirin 325 MG tablet  Replaced by:  aspirin EC 81 MG tablet     spironolactone 25 MG tablet  Commonly known as:  ALDACTONE      TAKE these medications       amLODipine 5 MG tablet  Commonly known as:  NORVASC  Take 5 mg by mouth daily.     aspirin EC 81 MG tablet  Take 1 tablet (81 mg total) by mouth daily.     atenolol 50 MG tablet  Commonly known as:  TENORMIN  Take 25 mg by mouth daily.     furosemide 40 MG tablet  Commonly known as:  LASIX  Take 0.5 tablets (20 mg total) by mouth daily.     omeprazole 20 MG capsule  Commonly known as:  PRILOSEC  Take 20 mg by mouth daily.     sertraline 100 MG tablet  Commonly known as:  ZOLOFT  Take 100 mg by mouth daily.     valsartan 160 MG tablet  Commonly known as:  DIOVAN  Take 160 mg by mouth daily.         BRING ALL MEDICATIONS WITH YOU TO FOLLOW UP APPOINTMENTS  Time spent with patient to include physician time:40 minutes Signed: TURNER,TRACI R 10/19/2012, 10:38 AM

## 2012-10-19 NOTE — Progress Notes (Signed)
Discharge review done with patient's daughter.  She acknowledged understanding of information provided.  Patient is stable and discharged home with daughter.  Kaylee Schmitt

## 2012-10-23 ENCOUNTER — Other Ambulatory Visit: Payer: Self-pay | Admitting: *Deleted

## 2012-10-23 DIAGNOSIS — N289 Disorder of kidney and ureter, unspecified: Secondary | ICD-10-CM

## 2012-10-23 DIAGNOSIS — I359 Nonrheumatic aortic valve disorder, unspecified: Secondary | ICD-10-CM

## 2012-10-24 ENCOUNTER — Ambulatory Visit: Payer: Medicare Other | Attending: Thoracic Surgery (Cardiothoracic Vascular Surgery) | Admitting: Physical Therapy

## 2012-10-24 DIAGNOSIS — Z9181 History of falling: Secondary | ICD-10-CM | POA: Insufficient documentation

## 2012-10-24 DIAGNOSIS — R5383 Other fatigue: Secondary | ICD-10-CM | POA: Insufficient documentation

## 2012-10-24 DIAGNOSIS — R5381 Other malaise: Secondary | ICD-10-CM | POA: Insufficient documentation

## 2012-10-24 DIAGNOSIS — R269 Unspecified abnormalities of gait and mobility: Secondary | ICD-10-CM | POA: Insufficient documentation

## 2012-10-24 DIAGNOSIS — IMO0001 Reserved for inherently not codable concepts without codable children: Secondary | ICD-10-CM | POA: Insufficient documentation

## 2012-10-24 DIAGNOSIS — Z01818 Encounter for other preprocedural examination: Secondary | ICD-10-CM | POA: Insufficient documentation

## 2012-10-26 ENCOUNTER — Ambulatory Visit (HOSPITAL_COMMUNITY)
Admission: RE | Admit: 2012-10-26 | Discharge: 2012-10-26 | Disposition: A | Discharge status: Home / Self Care | Payer: Medicare Other | Source: Ambulatory Visit | Attending: Thoracic Surgery (Cardiothoracic Vascular Surgery) | Admitting: Thoracic Surgery (Cardiothoracic Vascular Surgery)

## 2012-10-26 ENCOUNTER — Encounter (HOSPITAL_COMMUNITY): Payer: Self-pay

## 2012-10-26 DIAGNOSIS — N289 Disorder of kidney and ureter, unspecified: Secondary | ICD-10-CM

## 2012-10-26 DIAGNOSIS — I359 Nonrheumatic aortic valve disorder, unspecified: Secondary | ICD-10-CM

## 2012-10-26 DIAGNOSIS — I2510 Atherosclerotic heart disease of native coronary artery without angina pectoris: Secondary | ICD-10-CM | POA: Insufficient documentation

## 2012-10-26 HISTORY — DX: Essential (primary) hypertension: I10

## 2012-10-26 MED ORDER — IOHEXOL 350 MG/ML SOLN: 80.0000 mL | Freq: Once | INTRAVENOUS | Status: AC | PRN

## 2012-10-30 ENCOUNTER — Other Ambulatory Visit: Payer: Self-pay | Admitting: Thoracic Surgery (Cardiothoracic Vascular Surgery)

## 2012-10-30 LAB — BASIC METABOLIC PANEL WITH GFR
CO2: 26 mEq/L (ref 19–32)
Chloride: 104 mEq/L (ref 96–112)
Creat: 1.2 mg/dL — ABNORMAL HIGH (ref 0.50–1.10)

## 2012-11-06 ENCOUNTER — Other Ambulatory Visit: Payer: Self-pay | Admitting: Thoracic Surgery (Cardiothoracic Vascular Surgery)

## 2012-11-06 ENCOUNTER — Ambulatory Visit (HOSPITAL_COMMUNITY)
Admission: RE | Admit: 2012-11-06 | Discharge: 2012-11-06 | Disposition: A | Discharge status: Home / Self Care | Payer: Medicare Other | Source: Ambulatory Visit | Attending: Thoracic Surgery (Cardiothoracic Vascular Surgery) | Admitting: Thoracic Surgery (Cardiothoracic Vascular Surgery)

## 2012-11-06 ENCOUNTER — Other Ambulatory Visit: Payer: Self-pay

## 2012-11-06 DIAGNOSIS — I359 Nonrheumatic aortic valve disorder, unspecified: Secondary | ICD-10-CM | POA: Insufficient documentation

## 2012-11-06 DIAGNOSIS — I350 Nonrheumatic aortic (valve) stenosis: Secondary | ICD-10-CM

## 2012-11-06 DIAGNOSIS — Z01818 Encounter for other preprocedural examination: Secondary | ICD-10-CM | POA: Insufficient documentation

## 2012-11-06 DIAGNOSIS — I708 Atherosclerosis of other arteries: Secondary | ICD-10-CM | POA: Insufficient documentation

## 2012-11-06 DIAGNOSIS — I700 Atherosclerosis of aorta: Secondary | ICD-10-CM | POA: Insufficient documentation

## 2012-11-06 DIAGNOSIS — N289 Disorder of kidney and ureter, unspecified: Secondary | ICD-10-CM

## 2012-11-06 DIAGNOSIS — J811 Chronic pulmonary edema: Secondary | ICD-10-CM | POA: Insufficient documentation

## 2012-11-06 DIAGNOSIS — K5730 Diverticulosis of large intestine without perforation or abscess without bleeding: Secondary | ICD-10-CM | POA: Insufficient documentation

## 2012-11-06 DIAGNOSIS — R188 Other ascites: Secondary | ICD-10-CM | POA: Insufficient documentation

## 2012-11-06 DIAGNOSIS — J90 Pleural effusion, not elsewhere classified: Secondary | ICD-10-CM | POA: Insufficient documentation

## 2012-11-06 MED ORDER — IOHEXOL 350 MG/ML SOLN
100.0000 mL | Freq: Once | INTRAVENOUS | Status: AC | PRN
  Administered 2012-11-06: 100 mL via INTRAVENOUS

## 2012-11-06 NOTE — Procedures (Signed)
Successful placement of a 4 fr vascular sheath to be used for temporary venous access for impending contrast enhanced CT scan.  No immediate complications.  The IV is ready for immediate use.

## 2012-11-07 ENCOUNTER — Ambulatory Visit: Payer: Medicare Other | Admitting: Thoracic Surgery (Cardiothoracic Vascular Surgery)

## 2012-11-20 ENCOUNTER — Encounter: Payer: Self-pay | Admitting: Thoracic Surgery (Cardiothoracic Vascular Surgery)

## 2012-11-20 ENCOUNTER — Ambulatory Visit (INDEPENDENT_AMBULATORY_CARE_PROVIDER_SITE_OTHER): Payer: Medicare Other | Admitting: Thoracic Surgery (Cardiothoracic Vascular Surgery)

## 2012-11-20 ENCOUNTER — Other Ambulatory Visit: Payer: Self-pay | Admitting: *Deleted

## 2012-11-20 VITALS — BP 159/55 | HR 57 | Resp 20 | Ht 59.0 in | Wt 137.0 lb

## 2012-11-20 DIAGNOSIS — I350 Nonrheumatic aortic (valve) stenosis: Secondary | ICD-10-CM

## 2012-11-20 DIAGNOSIS — I359 Nonrheumatic aortic valve disorder, unspecified: Secondary | ICD-10-CM

## 2012-11-20 NOTE — Progress Notes (Signed)
[facility]  MULTIDISCIPLINARY HEART VALVE CLINIC    CARDIOTHORACIC SURGERY CONSULTATION REPORT  Referring Provider is Kaylee Reichert, MD   Chief Complaint  Patient presents with  . Aortic Stenosis    surgical eval for TAVR procedure    HPI:  Patient is an 79 year old widowed white female with known history of aortic stenosis, congestive heart failure, coronary artery disease, cerebrovascular disease, and hypertension. Her cardiac history dates back to 2003 when she suffered a right hemispheric stroke. At that time she lived in [place] and symptoms reportedly were initially manifested as left sided hemiplegia, and the patient recovered with mild residual left-sided weakness. The following year she first developed symptoms of congestive heart failure, and subsequently she moved permanently [place] where she has lived with her daughter ever since. Over the years she developed slow gradual progression of symptoms of exertional shortness of breath. In 2010 she was found to have severe calcific aortic stenosis. Cardiac catheterization revealed two-vessel coronary artery disease, and the patient was referred for surgical consultation. I had the opportunity to meet the patient at that time, but the patient was noted to have extremely severe calcification of the entire ascending thoracic aorta. As a result she was not felt to be candidate for conventional surgical aortic valve replacement with or without coronary artery bypass grafting. She subsequently underwent PCI and stenting of the left anterior descending coronary artery using bare metal stents and she was referred to [facility] in [place] to consider enrolling in an ongoing research trial involving transcatheter aortic valve replacement. Apparently several months went by after the patient's initial evaluation in [place], and subsequently the patient's family stopped pursuing this alternative. The patient has continued to  follow up regularly with Dr. Mayford Schmitt ever since.   Over the last few months the patient has had further progression of symptoms of severe exertional shortness of breath with intermittent substernal chest tightness. She lives a fairly sedentary existence, although the patient is still able to ambulate short distances using a cane or walker. She is limited by degenerative arthritis in her knees and mild weakness in her left lower extremity related to her previous stroke. She has mild forgetfulness although this has not been progressive nor very problematic. She also suffers from considerable hearing loss. However, the patient and her family note that her primary limitation is that of exertional shortness of breath and weakness. Her appetite has been somewhat decreased and she has probably lost 10-20 pounds. The patient developed further progression of shortness of breath with multiple episodes of PND and orthopnea with resting shortness of breath and chest tightness occurring primarily at night. She was admitted to the hospital in class IV congestive heart failure on 10/12/2012. Symptoms have improved quickly with diuretic therapy. Followup echocardiogram demonstrated the presence of severe calcific aortic stenosis with peak velocity across the aortic valve ranging between 3.6 and 4.1 m/sec. Left ventricular systolic function remains reasonably well-preserved.  I had the opportunity to evaluate her in consultation at that time with Dr. Clifton Schmitt to consider the possibility of transcatheter aortic valve replacement.  After considerable discussion with the patient and her family, the patient elected to proceed with further diagnostic workup. She underwent left and right heart catheterization by Dr. Katrinka Schmitt. This confirmed the presence of severe aortic stenosis and revealed that the previously placed stents in the left anterior descending coronary artery were widely patent and there was no other significant progression of  coronary artery disease.  Pulmonary artery pressures  were only mildly elevated.  Since hospital discharge the patient has bleeding further diagnostic workup including cardiac gated CT angiogram of the heart and CT angiogram of the chest abdomen and pelvis.   The patient returns to the office today with her daughter to discuss results of these tests and make further decisions as to whether or not to proceed with definitive surgical intervention. Over last 2 weeks the patient has again developed worsening shortness of breath. She was seen in the office last week by Dr. Mayford Schmitt and her diuretic dose was increased. Since then she has lost 3 pounds in weight and her breathing has improved somewhat.  She denies resting shortness of breath but she gets short of breath with mild activity. She has not had any dizzy spells nor syncope. She has never had any chest discomfort.    Past Medical History  Diagnosis Date  . Aortic stenosis   . CHF (congestive heart failure)   . CAD (coronary artery disease) 10/12/2012    Cath 2011  Calcified LM,  80% mid LAD, dominant circ without sig disease, Ostial nondominant RCA.  Severe AS  Liberte stent x 2 2011 Dr. Amil Schmitt    . History of CVA (cerebrovascular accident)     2003 while living in [place].   Right brain with left hemiparesis   . Gout 10/12/2012  . Hypertensive heart disease 10/12/2012  . Hyperlipidemia 10/12/2012  . Hypertension     Past Surgical History  Procedure Laterality Date  . Coronary stent placement    . Cholecystectomy    . Appendectomy    . Cataract extraction      Family History  Problem Relation Age of Onset  . Cancer - Prostate Father   . CAD Mother     History   Social History  . Marital Status: Widowed    Spouse Name: N/A    Number of Children: 8  . Years of Education: N/A   Occupational History  . Not on file.   Social History Main Topics  . Smoking status: Never Smoker   . Smokeless tobacco: Not on file  . Alcohol Use: No  .  Drug Use: No  . Sexually Active: Not on file   Other Topics Concern  . Not on file   Social History Narrative   Widow.  Lives with daughter.    Current Outpatient Prescriptions  Medication Sig Dispense Refill  . amLODipine (NORVASC) 5 MG tablet Take 5 mg by mouth daily.      Marland Kitchen aspirin EC 81 MG tablet Take 1 tablet (81 mg total) by mouth daily.      Marland Kitchen atenolol (TENORMIN) 50 MG tablet Take 25 mg by mouth daily.      . furosemide (LASIX) 40 MG tablet Take 20 mg by mouth 2 (two) times daily.      Marland Kitchen omeprazole (PRILOSEC) 20 MG capsule Take 20 mg by mouth daily.      . sertraline (ZOLOFT) 100 MG tablet Take 100 mg by mouth daily.      . valsartan (DIOVAN) 160 MG tablet Take 160 mg by mouth daily.       No current facility-administered medications for this visit.    Allergies  Allergen Reactions  . Penicillins Itching and Rash  . Sulfa Antibiotics Itching and Rash      Review of Systems:  General:  decreased appetite, decreased energy, no weight gain, + weight loss, no fever  Cardiac:  + chest pain with exertion, + chest  pain at rest, + SOB with minimal exertion, + occasional resting SOB, + PND, + orthopnea, no palpitations, no arrhythmia, no atrial fibrillation, no LE edema, no dizzy spells, no syncope  Respiratory:  + shortness of breath, no home oxygen, no productive cough, + dry cough, no bronchitis, no wheezing, no hemoptysis, no asthma, no pain with inspiration or cough, no sleep apnea, no CPAP at night  GI:  + difficulty swallowing, no reflux, no frequent heartburn, no hiatal hernia, no abdominal pain, + constipation, + diarrhea, no hematochezia, no hematemesis, no melena  GU:  no dysuria, no frequency, no urinary tract infection, no hematuria, no kidney stones, + mild kidney disease  Vascular:  no pain suggestive of claudication, no pain in feet, + leg cramps, no varicose veins, no DVT, no non-healing foot ulcer  Neuro:  + stroke, no TIA's, no seizures, no headaches, no  temporary blindness one eye, no slurred speech, no peripheral neuropathy, + mild chronic pain in kness, + instability of gait, + mild memory/cognitive dysfunction  Musculoskeletal: + arthritis, no joint swelling, no myalgias, + mild difficulty walking, limited mobility  Skin:   no rash, no itching, no skin infections, no pressure sores or ulcerations  Psych:  no anxiety, no depression, no nervousness, no unusual recent stress  Eyes:   + blurry vision, no floaters, + recent vision changes  ENT:   + severe hearing loss, edentulous, + full set dentures,  Hematologic:  + easy bruising, no abnormal bleeding, no clotting disorder, no frequent epistaxis  Endocrine:  no diabetes, does not check CBG's at home       Physical Exam:   BP 159/55  Pulse 57  Resp 20  Ht 4\' 11"  (1.499 m)  Wt 137 lb (62.143 kg)  BMI 27.66 kg/m2  SpO2 98%  General:  Elderly and frail-appearing   HEENT:  Unremarkable   Neck:   no JVD, no bruits, no adenopathy   Chest:  clear to auscultation, symmetrical breath sounds, no wheezes, no rhonchi   CV:   RRR, grade IV/VI crescendo/decrescendo murmur heard best at RSB, no diastolic murmur   Abdomen:  soft, non-tender, no masses   Extremities:  warm, well-perfused, pulses diminished, no LE edema   Rectal/GU  Deferred   Neuro:  Grossly non-focal and symmetrical throughout   Skin:   Clean and dry, no rashes, no breakdown   Diagnostic Tests:  Transthoracic Echocardiography  Patient: Kaylee Schmitt, Kaylee Schmitt MR #: [id number] Study Date: 10/12/2012 Gender: F Age: 45 Height: Weight: BSA: Pt. Status: Room: D36C  ADMITTING Armanda Magic, MD ATTENDING Armanda Magic, MD PERFORMING [facility] SONOGRAPHER Jeryl Columbia Romero Liner cc:  ------------------------------------------------------------ LV EF: 60% - 65%  ------------------------------------------------------------ History: PMH: Aortic Stenosis AS Coronary artery disease. Congestive heart  failure. Stroke. Risk factors: Dyslipidemia.  ------------------------------------------------------------ Study Conclusions  - Left ventricle: The cavity size was normal. Wall thickness was increased in a pattern of severe LVH. Systolic function was normal. The estimated ejection fraction was in the range of 60% to 65%. Wall motion was normal; there were no regional wall motion abnormalities. - Aortic valve: Valve mobility was severely restricted. There was severe stenosis. Mild regurgitation. Valve area: 0.53cm^2(VTI). Valve area: 0.47cm^2 (Vmax). - Mitral valve: Severely calcified annulus. Moderately calcified leaflets . - Left atrium: The atrium was moderately to severely dilated. - Pulmonary arteries: Systolic pressure was moderately increased. Transthoracic echocardiography. M-mode, complete 2D, spectral Doppler, and color Doppler. Blood pressure: 165/60. Patient status: Inpatient. Location: Bedside.  ------------------------------------------------------------  ------------------------------------------------------------  Left ventricle: The cavity size was normal. Wall thickness was increased in a pattern of severe LVH. Systolic function was normal. The estimated ejection fraction was in the range of 60% to 65%. Wall motion was normal; there were no regional wall motion abnormalities.  ------------------------------------------------------------ Aortic valve: Trileaflet; severely thickened, severely calcified leaflets. Valve mobility was severely restricted. Doppler: There was severe stenosis. Mild regurgitation. VTI ratio of LVOT to aortic valve: 0.17. Valve area: 0.53cm^2(VTI). Peak velocity ratio of LVOT to aortic valve: 0.15. Valve area: 0.47cm^2 (Vmax). Mean gradient: 38mm Hg (S). Peak gradient: 61mm Hg (S).  ------------------------------------------------------------ Aorta: Aortic root: The aortic root was normal in  size.  ------------------------------------------------------------ Mitral valve: Submitral structures heavily calcified. Severely calcified annulus. Moderately calcified leaflets . Mobility was not restricted. Doppler: Transvalvular velocity was within the normal range. There was no evidence for stenosis. No regurgitation.  ------------------------------------------------------------ Left atrium: The atrium was moderately to severely dilated.  ------------------------------------------------------------ Right ventricle: The cavity size was normal. Wall thickness was normal. Systolic function was normal.  ------------------------------------------------------------ Pulmonic valve: Not well visualized. Doppler: Transvalvular velocity was within the normal range. There was no evidence for stenosis.  ------------------------------------------------------------ Tricuspid valve: Structurally normal valve. Doppler: Transvalvular velocity was within the normal range. Mild regurgitation.  ------------------------------------------------------------ Pulmonary artery: Systolic pressure was moderately increased.  ------------------------------------------------------------ Right atrium: The atrium was normal in size.  ------------------------------------------------------------ Pericardium: There was no pericardial effusion.  ------------------------------------------------------------ Systemic veins: Inferior vena cava: The vessel was normal in size.  ------------------------------------------------------------  2D measurements Normal Doppler measurements Normal Left ventricle LVOT LVID ED, 29.3 mm 43-52 Peak vel, S 58 cm/s ------ chord, PLAX VTI, S 18.9 cm ------ LVID ES, 22.4 mm 23-38 Aortic valve chord, PLAX Peak vel, S 390 cm/s ------ FS, chord, 24 % >29 Mean vel, S 294 cm/s ------ PLAX VTI, S 111 cm ------ LVPW, ED 16.9 mm ------ Mean 38 mm ------ IVS/LVPW 0.82 <1.3  gradient, S Hg ratio, ED Peak 61 mm ------ Ventricular septum gradient, S Hg IVS, ED 13.9 mm ------ VTI ratio 0.17 ------ LVOT LVOT/AV Diam, S 20 mm ------ Area, VTI 0.53 cm^2 ------ Area 3.14 cm^2 ------ Peak vel 0.15 ------ Aorta ratio, Root diam, 28 mm ------ LVOT/AV ED Area, Vmax 0.47 cm^2 ------ Regurg PHT 281 ms ------ Tricuspid valve Regurg peak 351 cm/s ------ vel Peak RV-RA 49 mm ------ gradient, S Hg  ------------------------------------------------------------ Prepared and Electronically Authenticated by  Ellwood Handler [id number]:[id number]   Diagnostic Cardiac Catheterization Report  JOIA DOYLE  79 y.o.  female  1923/06/01  Procedure Date: 10/18/2012  Referring Physician: Armanda Magic, MD / Ashley Mariner, MD / C. McAlhaney MD  Primary Cardiologist:: Kym Groom  PROCEDURE: Left heart catheterization with selective coronary angiography, left ventriculogram.  INDICATIONS: Severe aortic stenosis with limiting symptoms in whom left and right heart cath with hemodynamic valve assessment is requested as a possible prelude to TAVR.  The risks, benefits, and details of the procedure were explained to the patient. The patient verbalized understanding and wanted to proceed. Informed written consent was obtained.  PROCEDURE TECHNIQUE: After Xylocaine anesthesia a 5 French sheath was placed in the right femoral artery with a single anterior needle wall stick. The right femoral vein was more difficult to cannulate but after multiple attempts was identified with a SmartNeedle. 7 French sheath was placed. Left heart catheterization and coronary angiography was performed using 5 French 4 cm left and right Judkins catheters. The Judkins right catheter was used to cross the aortic valve using  a 300 cm long straight-tip 0.036 guidewire. A total of 2000 units of heparin was given prior to attempting to cross the valve. Left ventriculography was done with the 5 [place] JR 4 using hand  injection .  Right heart catheterization was performed using a Swan-Ganz catheter. A right atrial loop was required to enter the main pulmonary artery. A main pulmonary artery O2 saturation was obtained. Thermodilution cardiac outputs were also performed.  No complications.  CONTRAST: Total of 70 cc.  COMPLICATIONS: None.  HEMODYNAMICS: Aortic pressure was 143/38 mmHg; LV pressure was 191/4 mmHg; LVEDP 13 mm mercury; right atrial mean 2 mmHg; right ventricular pressure 47/5 mmHg; pulmonary artery pressure 44/16 mmHg; capillary wedge pressure mean 9 mm mercury; Fick cardiac output 4.3 L per minute; thermodilution cardiac output 3.6 L per minute; peak to peak aortic valve gradient 48 mmHg; aortic valve area 0.84 cm square (Fick) and 0.69 cm square (Thermo dilution).  ANGIOGRAPHIC DATA: The left main coronary artery is calcified but widely patent..  The left anterior descending artery is transapical. There is proximal calcification with 30-40% narrowing noted. There is 30-50% in-stent restenosis within the stented region proximally. Luminal irregularities are noted in the mid and distal LAD. The first diagonal is patent despite being jailed..  The left circumflex artery is dominant. Luminal irregularities are noted. 3 obtuse marginal branches and the PDA arises distally. Tortuosity is seen throughout the circumflex and its branches. No significant obstruction is noted..  The right coronary artery is nondominant/codominant and contains 50% ostial narrowing. No catheter damping noted. The mid to proximal RCA contains 30-40% narrowing to  LEFT VENTRICULOGRAM: Left ventricular angiogram was done in the 30 RAO projection and revealed normal left ventricular wall motion and systolic function with an estimated ejection fraction of 65 %.  IMPRESSIONS: 1. Calcific aortic stenosis, critical  2. Widely patent native coronary arteries  3. Moderate pulmonary hypertension  4. Heavily calcified/porcelain aorta along  with heavy aortic valve and mitral annular calcification  5. Normal left ventricular systolic function with normal filling pressures of the time of cath  RECOMMENDATION: 1. IV hydration  2. Further evaluation and management per Dr. Mayford Schmitt and the heart valve team.   Cardiac-gated CTA:  Indication: Pre TAVR  Protocol: The patient was scanned on a Philips 256 scanner. The  i.v. team needed to start a PIC line to do the study. The patient  required no beta blockade or nitro. Average HR during scan was 57  bpm. A 100 kV retrospective scan was done. The 3D data set was  analyzed on a [employer] work station using MPR MIP and VRT modes  Aortic Valve: Trileaflet Heavily calcified especially the left  coronary cusp  Sinus Measurements:  Right Coronary Sinus: 30 mm  Left Coronary Sinus: 29.7 mm  Non Coronary Sinus: 29.2 mm  LM height- 13.8 mm  RCA height- 15 mm  Aorta: Patient had a porcelain aorta. The calcification was  circumferential at the sinotubular junction. There entire  ascending aorta and arch had "egg-shell" like calcification  STJ- 26.6 mm  Ascending Aorta- 30 mm  Annulus Measurements:  Max/Min diameter- 26 mm and 20 mm  Perimeter- 75.9 mm  Area- 450 mm2  Coronary Arteries Left dominant  LM- calcified but no significant stenosis  LAD- patent proximal stent  D1: patent but crossed by stent  Circumflex: non-significant calcified disease. 3 patent OM  branches without flow limiting stenosis  RCA - small and non dominant less than 50% proximal disease  LAA- No Left atrial appendage thrombus  Impression:  1) Sever calcific aortic stenosis. Tri-leaflet valve with heavy  calcification of the left cusp and mitral annular plane. Annular  measurements suggest she is a candidate for a 26 mm Sapien valve.  Possibly to under-inflate do to porcelain aort  2) Porcelain Ascending Aorta and Arch. Calcification is  circumferential and extends to the sinotubular junction  3) No critical  CAD with patent LAD stent. Low ischemic potential  during rapid pacing. Coronary arteries sufficiently high in  sinuses  4) No Left Atrial Appendage Thrombus  Charlton Haws MD [facility]  Original Report Authenticated By: Charlton Haws, M.D.  Reyes Ivan, MD Mon Oct 29, 2012 11:47:03 AM EDT       **ADDENDUM** CREATED: 10/29/2012 10:56:51  OVER-READ INTERPRETATION - CT CHEST  The following report is an over-read performed by radiologist Dr.  Reyes Ivan, M.D. of [facility] on 10/29/2012  10:56:51. This over-read does not include interpretation of  cardiac or coronary anatomy or pathology. The CTA interpretation  by the cardiologist is attached.  Comparison: CT abdomen pelvis 11/23/2007.  Findings: A 1.4 cm low attenuation lesion is seen in the left lobe  of the thyroid. No pathologically enlarged mediastinal lymph  nodes. There is right hilar lymphoid tissue.  Visualized portions of the lungs shows scattered areas of basilar  predominant patchy ground-glass, mild architectural distortion and  subpleural reticulation. There appears be calcification along the  minor fissure. Airway is grossly unremarkable.  Visualized portion of the upper abdomen shows no acute findings. A  1.8 cm low attenuation lesion in the right hepatic lobe is  unchanged from 11/23/2007. Right renal cortical scarring.  IMPRESSION:  1. Patchy areas of basilar predominant ground-glass, architectural  distortion and subpleural attenuation, highly suspicious for  interstitial lung disease. However, lungs are incompletely  visualized. If the patient complains of respiratory symptoms,  dedicated high resolution chest CT without contrast could be  performed in further evaluation, as clinically indicated.  2. Low attenuation lesion in the left thyroid. Consider further  evaluation with thyroid ultrasound. If patient is clinically  hyperthyroid, consider nuclear medicine thyroid uptake and scan.  **END  ADDENDUM** SIGNED BY: Reyes Ivan, M.D.    CT ANGIOGRAPHY CHEST, ABDOMEN AND PELVIS  Technique: Multidetector CT imaging through the chest, abdomen and  pelvis was performed using the standard protocol during bolus  administration of intravenous contrast. Multiplanar reconstructed  images including MIPs were obtained and reviewed to evaluate the  vascular anatomy.  Contrast: OMNIPAQUE IOHEXOL 350 MG/ML SOLN  Comparison: Chest CT 10/26/2012. CT of the abdomen 11/23/2007.  CTA CHEST  Findings:  Mediastinum: Heart size is mildly enlarged. There is no significant  pericardial fluid, thickening or pericardial calcification.  Extensive calcifications of the mitral annulus. Extensive  thickening calcification of the aortic valve. There is  atherosclerosis of the thoracic aorta, the great vessels of the  mediastinum and the coronary arteries, including calcified  atherosclerotic plaque in the left main, left anterior descending,  left circumflex and right coronary arteries. There is also marked  tortuosity and atherosclerotic disease of the subclavian arteries  bilaterally. Particularly on the left, the subclavian artery  caliber is very narrowed proximally measuring only 2-3 mm. On the  right, the subclavian artery is markedly tortuous but larger in  caliber measuring 5-6 mm in minimal dimensions.  Lungs/Pleura: Extensive ground-glass attenuation and septal  thickening throughout the lungs bilaterally, favored to reflect  moderate pulmonary edema.  Small to moderate bilateral pleural  effusions layer dependently and are simple in appearance. No  definite acute consolidative airspace disease. No definite  suspicious appearing pulmonary nodules or masses are identified.  Musculoskeletal: There are no aggressive appearing lytic or blastic  lesions noted in the visualized portions of the skeleton.  Review of the MIP images confirms the above findings.  IMPRESSION:  1. The  appearance of the chest suggests congestive heart failure  with moderate pulmonary edema and small bilateral pleural  effusions, as above.  2. Atherosclerosis, including left main and three-vessel coronary  artery disease.  3. In addition, there is extensive tortuosity and atherosclerosis  of the subclavian arteries bilaterally (left greater than right),  with marked narrowing of the proximal subclavian artery which  measures only 2-3 mm and minimal diameter.  CTA ABDOMEN AND PELVIS  VASCULAR MEASUREMENTS PERTINENT TO TAVR:  AORTA:  Minimal Aortic Diameter - 12.6 x 10.3 mm  Severity of Aortic Calcification - moderate to severe  RIGHT PELVIS:  Right Common Iliac Artery -  Minimal Diameter - 5.6 x 5.8 mm  Tortuosity - mild  Calcification - mild to moderate  Right External Iliac Artery -  Minimal Diameter - 8.2 x 7.3 mm  Tortuosity - moderate to severe  Calcification - mild  Right Common Femoral Artery -  Minimal Diameter - 6.1 x 7.6 mm  Tortuosity - mild  Calcification - mild  LEFT PELVIS:  Left Common Iliac Artery -  Minimal Diameter - 6.2 x 7.4 mm  Tortuosity - mild to moderate  Calcification - moderate  Left External Iliac Artery -  Minimal Diameter - 6.3 x 6.6 mm  Tortuosity - moderate to severe  Calcification - mild  Left Common Femoral Artery -  Minimal Diameter - 6.4 x 7.1 mm  Tortuosity - mild  Calcification - mild  Other Findings:  Abdomen/Pelvis: Status post cholecystectomy. 1.8 cm low  attenuation lesion in segment 7 of the liver is poorly  characterized on today's arterial phase examination, but is  unchanged in size compared to the prior study from 11/23/2007, and  most compatible with a small hepatic cyst. The appearance of the  pancreas, spleen, bilateral adrenal glands and bilateral kidneys is  unremarkable. Numerous colonic diverticula are noted, without  surrounding inflammatory changes to suggest acute diverticulitis at  this time. Trace volume of  ascites in the anatomic pelvis. No  larger collection of ascites. No pneumoperitoneum. No pathologic  distension of small bowel. No definite pathologic lymphadenopathy  identified within the abdomen or pelvis. A small locule of gas non  dependently within the lumen of the urinary bladder. No Foley  balloon catheter in place at this time. Uterus and ovaries are  atrophic. A tiny umbilical hernia containing only omental fat  incidentally noted.  Musculoskeletal: There are no aggressive appearing lytic or blastic  lesions noted in the visualized portions of the skeleton. 7 mm of  anterolisthesis of L4 upon L5.  Review of the MIP images confirms the above findings.  IMPRESSION:  1. Vascular findings and measurements pertinent to potential TAVR  procedure, as detailed above. The patient does appear to have a  suitable left sided pelvic arterial access in terms of vessel size  (for low profile devices). However, there is extensive tortuosity  of the external iliac arteries bilaterally.  2. Trace volume of ascites. This may relate to passive hepatic  congestion from underlying congestive heart failure.  3. Small locule of gas noted nondependently within the lumen  of  the urinary bladder. No Foley balloon catheter is noted at this  time. This may be iatrogenic if the patient has recently been  catheterized for urinalysis. Alternatively, this could be  indicative of infection with gas forming organisms. Clinical  correlation is recommended.  4. Extensive colonic diverticulosis without findings to suggest  acute diverticulitis at this time.  5. Additional incidental findings, as above.  Original Report Authenticated By: Trudie Reed, M.D.   Impression:  The patient has severe symptomatic aortic stenosis with reasonably well-preserved left ventricular systolic function. The patient also has known history of two-vessel coronary artery disease for which she underwent PCI and stenting using  bare metal stents in the left anterior descending coronary artery 4 years ago.  Followup catheterization demonstrates that the stents are widely patent and there has been no significant progression of her coronary artery disease. Symptoms of congestive heart failure have progressed substantially over the last few years, particularly over the last few months leading up to her recent hospitalization with acute exacerbation of chronic combined systolic and diastolic CHF, class IV. Over the last few weeks since her recent hospital discharge her shortness of breath has again gotten worse, requiring further increase dose of her diuretic therapy.  The patient lives a very sedentary lifestyle with limited physical mobility due to mild left-sided weakness from previous stroke, degenerative arthritis, and severe exertional shortness of breath. She also has very mild dementia, although this does not seem to be very limiting from a practical standpoint. 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 Options include transcatheter aortic valve replacement or continued palliative medical therapy.  Based upon review of her recent cardiac gated CT angiogram of the heart and CT angiogram of the chest, abdomen and pelvis, the patient appears to have anatomical findings suitable for possible transcatheter aortic valve replacement using the transfemoral approach on the left side. Her iliac vessels are somewhat tortuous but relatively free of calcium. She does have circumferential calcification at the distal aorta just above the bifurcation, but at this level the vessels are large enough that it will likely not be problematic.  The size of her iliac and femoral vessels is borderline for a 26 mm valve but should easily take a 23 mm valve.  The trans-apical approach could be utilized if transfemoral approach appears not be technically feasible. The patient would not be considered candidate for bail-out sternotomy under any circumstances because of her porcelain aorta.   Plan:  The patient and her daughter were counseled at length regarding treatment alternatives for management of severe symptomatic aortic stenosis. To of her sons listened in to the conversation via telephone.  Alternative approaches such as conventional aortic valve replacement, transcatheter aortic valve replacement, and palliative medical therapy were compared and contrasted at length.  The risks associated with conventional surgical aortic valve replacement were been discussed in detail, as were expectations for post-operative convalescence. Long-term prognosis with medical therapy was discussed. This discussion was placed in the context of the patient's own specific clinical presentation and past medical history.  All of their questions been addressed.  The patient is eager to proceed with surgery in the near future. She will be seen by one of my partners for a second opinion later this week and evaluated formally by Dr. Excell Seltzer in the multidisciplinary  heart valve clinic on Friday. We'll tentatively plan to proceed with transcatheter aortic valve replacement via open left transfemoral approach on Tuesday, 11/27/2012.  I spent in excess of 60 minutes with the patient and her family discussing matters at length in the office today.     Salvatore Decent. Cornelius Moras, MD 11/20/2012 3:53 PM

## 2012-11-21 ENCOUNTER — Institutional Professional Consult (permissible substitution) (INDEPENDENT_AMBULATORY_CARE_PROVIDER_SITE_OTHER): Payer: Medicare Other | Admitting: Cardiothoracic Surgery

## 2012-11-21 ENCOUNTER — Encounter: Payer: Self-pay | Admitting: Cardiothoracic Surgery

## 2012-11-21 ENCOUNTER — Encounter (HOSPITAL_COMMUNITY): Payer: Self-pay | Admitting: *Deleted

## 2012-11-21 ENCOUNTER — Encounter (HOSPITAL_COMMUNITY): Payer: Self-pay | Admitting: Pharmacy Technician

## 2012-11-21 VITALS — BP 175/58 | HR 56 | Resp 20 | Ht 59.0 in | Wt 137.0 lb

## 2012-11-21 DIAGNOSIS — I350 Nonrheumatic aortic (valve) stenosis: Secondary | ICD-10-CM

## 2012-11-21 DIAGNOSIS — I359 Nonrheumatic aortic valve disorder, unspecified: Secondary | ICD-10-CM

## 2012-11-21 NOTE — Progress Notes (Signed)
PCP is Quintella Reichert, MD Referring Provider is Quintella Reichert, MD  Chief Complaint  Patient presents with  . Aortic Stenosis    Further discuss TAVR scheduled for 11/27/2012    HPI: 79 year old Caucasian female with severe aortic stenosis, calculated valve area 0.7 with symptoms of class III diastolic congestive heart failure, worsening over the past few months. Her cardiac history also includes history of CAD status post bare-metal stent to the LAD, hypertension, remote stroke, and a porcelain thoracic aorta. She was evaluated for possible aVR several years ago however this was not performed due to her porcelain aorta. She recently has been evaluated for a T. aVR procedure. Cardiac catheterization showed a patent stent, normal LV systolic function and echocardiogram showed no other significant valvular disease. The aortic valve is heavily calcified with a gradient of over 50 mmHg.  Her surgical history includes previous appendectomy gallbladder and cataract surgery. She denies any significant thoracic trauma or chronic lung disease.   Past Medical History  Diagnosis Date  . Aortic stenosis   . CHF (congestive heart failure)   . CAD (coronary artery disease) 10/12/2012    Cath 2011  Calcified LM,  80% mid LAD, dominant circ without sig disease, Ostial nondominant RCA.  Severe AS  Liberte stent x 2 2011 Dr. Amil Amen    . History of CVA (cerebrovascular accident)     2003 while living in [place].   Right brain with left hemiparesis   . Gout 10/12/2012  . Hypertensive heart disease 10/12/2012  . Hyperlipidemia 10/12/2012  . Hypertension     Past Surgical History  Procedure Laterality Date  . Coronary stent placement    . Cholecystectomy    . Appendectomy    . Cataract extraction      Family History  Problem Relation Age of Onset  . Cancer - Prostate Father   . CAD Mother     Social History History  Substance Use Topics  . Smoking status: Never Smoker   . Smokeless tobacco: Not on file   . Alcohol Use: No    Current Outpatient Prescriptions  Medication Sig Dispense Refill  . amLODipine (NORVASC) 5 MG tablet Take 5 mg by mouth every evening.       Marland Kitchen aspirin EC 81 MG tablet Take 81 mg by mouth every morning.      Marland Kitchen atenolol (TENORMIN) 50 MG tablet Take 25 mg by mouth every morning.       . furosemide (LASIX) 40 MG tablet Take 40 mg by mouth 2 (two) times daily.       Marland Kitchen omeprazole (PRILOSEC) 20 MG capsule Take 20 mg by mouth every morning.       . sertraline (ZOLOFT) 100 MG tablet Take 100 mg by mouth every morning.       . valsartan (DIOVAN) 160 MG tablet Take 160 mg by mouth every morning.        No current facility-administered medications for this visit.    Allergies  Allergen Reactions  . Adhesive [Tape] Rash    Paper tape OK.  Marland Kitchen Penicillins Itching and Rash  . Sulfa Antibiotics Itching and Rash    Review of Systems sedentary lifestyle, weight stable, no recent upper respiratory infection or fever.  BP 175/58  Pulse 56  Resp 20  Ht 4\' 11"  (1.499 m)  Wt 137 lb (62.143 kg)  BMI 27.66 kg/m2  SpO2 98% Physical Exam General appearance-frail elderly female no acute distress HEENT normocephalic dentition good Neck no JVD,  diminished chronic pulses Lymphatics no palpable adenopathy in the neck or supraclavicular fossa Thorax no deformity or Tenderness breath sounds clear Cardiac regular rhythm loud 4/6 systolic ejection murmur of aortic stenosis Abdomen soft without pulsatile mass Extremities warm well perfused nontender minimal edema Neurologic hard of hearing otherwise no focal motor deficit  Diagnostic Tests: 2-D echocardiogram, CT scan, cardiac catheterization, all reviewed. Patient has severe symptomatic aortic stenosis . She would benefit from aortic valve replacement however she would not survive a conventional open aVR due to her porcelain aorta with expected mortality of greater than 20% Impression: Severe aortic stenosis Not a candidate for  conventional open aVR  Plan: Recommend T. aVR

## 2012-11-23 ENCOUNTER — Other Ambulatory Visit: Payer: Self-pay | Admitting: *Deleted

## 2012-11-23 ENCOUNTER — Other Ambulatory Visit (HOSPITAL_COMMUNITY): Payer: Self-pay | Admitting: *Deleted

## 2012-11-23 ENCOUNTER — Encounter (HOSPITAL_COMMUNITY): Payer: Self-pay | Admitting: Cardiovascular Disease

## 2012-11-23 ENCOUNTER — Encounter (HOSPITAL_COMMUNITY)
Admission: RE | Admit: 2012-11-23 | Discharge: 2012-11-23 | Disposition: A | Discharge status: Home / Self Care | Payer: Medicare Other | Source: Ambulatory Visit | Attending: Cardiology | Admitting: Cardiology

## 2012-11-23 ENCOUNTER — Ambulatory Visit (HOSPITAL_COMMUNITY)
Admission: RE | Admit: 2012-11-23 | Discharge: 2012-11-23 | Disposition: A | Discharge status: Home / Self Care | Payer: Medicare Other | Source: Ambulatory Visit | Attending: Cardiovascular Disease | Admitting: Cardiovascular Disease

## 2012-11-23 ENCOUNTER — Encounter (HOSPITAL_COMMUNITY): Payer: Self-pay

## 2012-11-23 VITALS — BP 156/52 | HR 56 | Temp 97.9°F | Resp 20 | Ht 59.0 in | Wt 130.6 lb

## 2012-11-23 VITALS — BP 156/42 | HR 55 | Resp 18 | Ht 59.0 in | Wt 130.0 lb

## 2012-11-23 DIAGNOSIS — Z01812 Encounter for preprocedural laboratory examination: Secondary | ICD-10-CM | POA: Insufficient documentation

## 2012-11-23 DIAGNOSIS — I359 Nonrheumatic aortic valve disorder, unspecified: Secondary | ICD-10-CM

## 2012-11-23 DIAGNOSIS — Z01810 Encounter for preprocedural cardiovascular examination: Secondary | ICD-10-CM | POA: Insufficient documentation

## 2012-11-23 DIAGNOSIS — Z01818 Encounter for other preprocedural examination: Secondary | ICD-10-CM | POA: Insufficient documentation

## 2012-11-23 HISTORY — DX: Chronic kidney disease, unspecified: N18.9

## 2012-11-23 LAB — SURGICAL PCR SCREEN: MRSA, PCR: NEGATIVE

## 2012-11-23 LAB — BLOOD GAS, ARTERIAL
Drawn by: 344381
Patient temperature: 98.6
pCO2 arterial: 37.5 mmHg (ref 35.0–45.0)
pH, Arterial: 7.465 — ABNORMAL HIGH (ref 7.350–7.450)

## 2012-11-23 LAB — CBC
MCHC: 34.2 g/dL (ref 30.0–36.0)
Platelets: 153 10*3/uL (ref 150–400)
RDW: 16 % — ABNORMAL HIGH (ref 11.5–15.5)

## 2012-11-23 LAB — COMPREHENSIVE METABOLIC PANEL
ALT: 6 U/L (ref 0–35)
AST: 14 U/L (ref 0–37)
Albumin: 3.6 g/dL (ref 3.5–5.2)
Alkaline Phosphatase: 72 U/L (ref 39–117)
BUN: 24 mg/dL — ABNORMAL HIGH (ref 6–23)
Potassium: 3.2 mEq/L — ABNORMAL LOW (ref 3.5–5.1)
Sodium: 139 mEq/L (ref 135–145)
Total Protein: 5.9 g/dL — ABNORMAL LOW (ref 6.0–8.3)

## 2012-11-23 LAB — APTT: aPTT: 33 seconds (ref 24–37)

## 2012-11-23 LAB — PROTIME-INR: INR: 1.08 (ref 0.00–1.49)

## 2012-11-23 MED ORDER — POTASSIUM CHLORIDE CRYS ER 20 MEQ PO TBCR: 20.0000 meq | EXTENDED_RELEASE_TABLET | Freq: Every day | ORAL | Status: DC

## 2012-11-23 NOTE — Progress Notes (Signed)
Pulmonary Function Tests  Baseline        FVC  1.37 L  (80% predicted)  FEV1  1.37 L  (112% predicted)  FEF25-75 2.65 L  (352% predicted)   RV  2.29 L  (100% predicted) DLCO  UNABLE TO OBTAIN  6 Minute Walk Test Results  Patient: Kaylee Schmitt Date:  11/23/2012   Supplemental O2 during test? NO      Baseline   End  Time   1500    1506 Heartrate  50    62 Dyspnea  2/10    4/10 Fatigue  2/10    4/10 O2 sat   98%    90% Blood pressure 178/58    200/50   Patient ambulated at a slow pace for a total distance of 170 feet with 1 stops.  Ambulation was limited primarily due to oxygen sats dropping to 86% but quickly came back up.  Overall the test was tolerated fair.

## 2012-11-23 NOTE — Progress Notes (Signed)
Pt has office visit with Dr Excell Seltzer today. K 3.2 will F/U with Revonda Standard PA.

## 2012-11-23 NOTE — Pre-Procedure Instructions (Signed)
Kaylee Schmitt  11/23/2012   Your procedure is scheduled on:  12/28/12  Report to [facility] at 530 AM.  Call this number if you have problems the morning of surgery: [phone]   Remember:   Do not eat food or drink liquids after midnight.   Take these medicines the morning of surgery with A SIP OF WATER: norvasc,atenolol,prilosec,zoloft   Do not wear jewelry, make-up or nail polish.  Do not wear lotions, powders, or perfumes. You may wear deodorant.  Do not shave 48 hours prior to surgery. Men may shave face and neck.  Do not bring valuables to the hospital.  [facility] is not responsible                   for any belongings or valuables.  Contacts, dentures or bridgework may not be worn into surgery.  Leave suitcase in the car. After surgery it may be brought to your room.  For patients admitted to the hospital, checkout time is 11:00 AM the day of  discharge.   Patients discharged the day of surgery will not be allowed to drive  home.  Name and phone number of your driver: family  Special Instructions: Shower using CHG 2 nights before surgery and the night before surgery.  If you shower the day of surgery use CHG.  Use special wash - you have one bottle of CHG for all showers.  You should use approximately 1/3 of the bottle for each shower.   Please read over the following fact sheets that you were given: Pain Booklet, Coughing and Deep Breathing, Blood Transfusion Information, MRSA Information and Surgical Site Infection Prevention

## 2012-11-24 LAB — HEMOGLOBIN A1C: Hgb A1c MFr Bld: 4.8 % (ref <5.7)

## 2012-11-25 ENCOUNTER — Encounter (HOSPITAL_COMMUNITY): Payer: Self-pay | Admitting: Cardiovascular Disease

## 2012-11-25 NOTE — Progress Notes (Signed)
MULTIDISCIPLINARY HEART VALVE CLINIC NOTE  Patient ID: Kaylee Schmitt MRN: [id number] DOB/AGE: 11-09-1923 79 y.o.  Primary Care Physician:TURNER,TRACI R, MD Primary Cardiologist: Dr Mayford Knife  HPI: 79 year old woman presenting to the multidisciplinary valve clinic for further evaluation of her severe aortic stenosis. She is a retired Engineer, civil (consulting), originally from [place]. The patient is widowed and lives with her daughter in [place]. 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 She's also had congestive heart failure and coronary artery disease. 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 She underwent PCI for treatment of her coronary artery disease. She was referred to [facility] for consideration of transcatheter aortic valve replacement through a clinical trial, but she ultimately did not complete that evaluation.  The patient has been extensively evaluated by doctors Montine Circle, and VanTrigt. She has completed her evaluation for transcatheter aortic valve replacement.  From a symptomatic perspective she continues to complain of dyspnea with minimal exertion. Her shortness of breath has been progressive over the last few months. Her diuretics were recently increased. She also has "fullness" in her chest with activity. She has not had lightheadedness, syncope, orthopnea, or PND. She's also limited by left-sided weakness and osteoarthritis in her right knee. She walks either with a walker or cane. However, her primary limitation is shortness of breath.  Past Medical History  Diagnosis Date  . Aortic stenosis   . CHF (congestive heart failure)   . CAD (coronary artery disease) 10/12/2012    Cath 2011  Calcified LM,  80% mid LAD, dominant circ without sig disease, Ostial nondominant RCA.  Severe AS  Liberte stent x 2 2011 Dr. Amil Amen    . History of CVA (cerebrovascular  accident)     2003 while living in [place].   Right brain with left hemiparesis   . Gout 10/12/2012  . Hypertensive heart disease 10/12/2012  . Hyperlipidemia 10/12/2012  . Hypertension   . Chronic kidney disease   . Shortness of breath   . Stroke     Past Surgical History  Procedure Laterality Date  . Coronary stent placement    . Cholecystectomy    . Appendectomy    . Cataract extraction    . Cardiac catheterization      Family History  Problem Relation Age of Onset  . Cancer - Prostate Father   . CAD Mother     History   Social History  . Marital Status: Widowed    Spouse Name: N/A    Number of Children: 8  . Years of Education: N/A   Occupational History  . Not on file.   Social History Main Topics  . Smoking status: Never Smoker   . Smokeless tobacco: Not on file  . Alcohol Use: No  . Drug Use: No  . Sexual Activity: Not on file   Other Topics Concern  . Not on file   Social History Narrative   Widow.  Lives with daughter.    Allergies  Allergen Reactions  . Adhesive [Tape] Rash    Paper tape OK.  Marland Kitchen Penicillins Itching and Rash  . Sulfa Antibiotics Itching and Rash   ROS:  General: no fevers/chills/night sweats, positive for weight loss Eyes: no blurry vision, diplopia, or amaurosis ENT: no sore throat, positive for hearing loss Resp: no cough, wheezing, or hemoptysis CV: no edema or palpitations GI: no abdominal pain, nausea, vomiting, diarrhea,  or constipation GU: no dysuria, frequency, or hematuria Skin: no rash Neuro: no headache, numbness, tingling, or weakness of extremities, positive for residual weakness related to old stroke Musculoskeletal: Positive for knee pain Heme: no bleeding, DVT, or easy bruising Endo: no polydipsia or polyuria  BP 156/42  Pulse 55  Resp 18  Ht 4\' 11"  (1.499 m)  Wt 130 lb (58.968 kg)  BMI 26.24 kg/m2  SpO2 98%  PHYSICAL EXAM: Pt is alert and oriented, WD, WN, pleasant elderly woman in no distress. HEENT:  normal Neck: JVP normal. Carotid upstrokes delayed with bilateral bruits. No thyromegaly. Lungs: equal expansion, clear bilaterally CV: Apex is discrete and nondisplaced, RRR with grade 3/6 harsh crescendo decrescendo murmur at the right upper sternal border  Abd: soft, NT, +BS, no bruit, no hepatosplenomegaly Back: no CVA tenderness Ext: no C/C/E        DP/PT pulses intact and = Skin: warm and dry without rash  2D ECHO:  Left ventricle: The cavity size was normal. Wall thickness was increased in a pattern of severe LVH. Systolic function was normal. The estimated ejection fraction was in the range of 60% to 65%. Wall motion was normal; there were no regional wall motion abnormalities.  ------------------------------------------------------------ Aortic valve: Trileaflet; severely thickened, severely calcified leaflets. Valve mobility was severely restricted. Doppler: There was severe stenosis. Mild regurgitation. VTI ratio of LVOT to aortic valve: 0.17. Valve area: 0.53cm^2(VTI). Peak velocity ratio of LVOT to aortic valve: 0.15. Valve area: 0.47cm^2 (Vmax). Mean gradient: 38mm Hg (S). Peak gradient: 61mm Hg (S).  ------------------------------------------------------------ Aorta: Aortic root: The aortic root was normal in size.  ------------------------------------------------------------ Mitral valve: Submitral structures heavily calcified. Severely calcified annulus. Moderately calcified leaflets . Mobility was not restricted. Doppler: Transvalvular velocity was within the normal range. There was no evidence for stenosis. No regurgitation.  ------------------------------------------------------------ Left atrium: The atrium was moderately to severely dilated.  ------------------------------------------------------------ Right ventricle: The cavity size was normal. Wall thickness was normal. Systolic function was  normal.  ------------------------------------------------------------ Pulmonic valve: Not well visualized. Doppler: Transvalvular velocity was within the normal range. There was no evidence for stenosis.  ------------------------------------------------------------ Tricuspid valve: Structurally normal valve. Doppler: Transvalvular velocity was within the normal range. Mild regurgitation.  ------------------------------------------------------------ Pulmonary artery: Systolic pressure was moderately increased.  ------------------------------------------------------------ Right atrium: The atrium was normal in size.  ------------------------------------------------------------ Pericardium: There was no pericardial effusion.  ------------------------------------------------------------ Systemic veins: Inferior vena cava: The vessel was normal in size.  ------------------------------------------------------------  2D measurements Normal Doppler measurements Normal Left ventricle LVOT LVID ED, 29.3 mm 43-52 Peak vel, S 58 cm/s ------ chord, PLAX VTI, S 18.9 cm ------ LVID ES, 22.4 mm 23-38 Aortic valve chord, PLAX Peak vel, S 390 cm/s ------ FS, chord, 24 % >29 Mean vel, S 294 cm/s ------ PLAX VTI, S 111 cm ------ LVPW, ED 16.9 mm ------ Mean 38 mm ------ IVS/LVPW 0.82 <1.3 gradient, S Hg ratio, ED Peak 61 mm ------ Ventricular septum gradient, S Hg IVS, ED 13.9 mm ------ VTI ratio 0.17 ------ LVOT LVOT/AV Diam, S 20 mm ------ Area, VTI 0.53 cm^2 ------ Area 3.14 cm^2 ------ Peak vel 0.15 ------ Aorta ratio, Root diam, 28 mm ------ LVOT/AV ED Area, Vmax 0.47 cm^2 ------ Regurg PHT 281 ms ------ Tricuspid valve Regurg peak 351 cm/s ------ vel Peak RV-RA 49 mm ------ gradient, S Hg  CARDIAC CATH:  HEMODYNAMICS: Aortic pressure was 143/38 mmHg; LV pressure was 191/4 mmHg; LVEDP 13 mm mercury; right atrial mean 2 mmHg; right ventricular pressure 47/5  mmHg; pulmonary  artery pressure 44/16 mmHg; capillary wedge pressure mean 9 mm mercury; Fick cardiac output 4.3 L per minute; thermodilution cardiac output 3.6 L per minute; peak to peak aortic valve gradient 48 mmHg; aortic valve area 0.84 cm square (Fick) and 0.69 cm square (Thermo dilution).  ANGIOGRAPHIC DATA: The left main coronary artery is calcified but widely patent..  The left anterior descending artery is transapical. There is proximal calcification with 30-40% narrowing noted. There is 30-50% in-stent restenosis within the stented region proximally. Luminal irregularities are noted in the mid and distal LAD. The first diagonal is patent despite being jailed..  The left circumflex artery is dominant. Luminal irregularities are noted. 3 obtuse marginal branches and the PDA arises distally. Tortuosity is seen throughout the circumflex and its branches. No significant obstruction is noted..  The right coronary artery is nondominant/codominant and contains 50% ostial narrowing. No catheter damping noted. The mid to proximal RCA contains 30-40% narrowing to  LEFT VENTRICULOGRAM: Left ventricular angiogram was done in the 30 RAO projection and revealed normal left ventricular wall motion and systolic function with an estimated ejection fraction of 65 %.  IMPRESSIONS: 1. Calcific aortic stenosis, critical  2. Widely patent native coronary arteries  3. Moderate pulmonary hypertension  4. Heavily calcified/porcelain aorta along with heavy aortic valve and mitral annular calcification  5. Normal left ventricular systolic function with normal filling pressures of the time of cath   Gated Cardiac CTA: Aortic Valve: Trileaflet Heavily calcified especially the left  coronary cusp  Sinus Measurements:  Right Coronary Sinus: 30 mm  Left Coronary Sinus: 29.7 mm  Non Coronary Sinus: 29.2 mm  LM height- 13.8 mm  RCA height- 15 mm  Aorta: Patient had a porcelain aorta. The calcification was  circumferential at the  sinotubular junction. There entire  ascending aorta and arch had "egg-shell" like calcification  STJ- 26.6 mm  Ascending Aorta- 30 mm  Annulus Measurements:  Max/Min diameter- 26 mm and 20 mm  Perimeter- 75.9 mm  Area- 450 mm2  Coronary Arteries Left dominant  LM- calcified but no significant stenosis  LAD- patent proximal stent  D1: patent but crossed by stent  Circumflex: non-significant calcified disease. 3 patent OM  branches without flow limiting stenosis  RCA - small and non dominant less than 50% proximal disease  LAA- No Left atrial appendage thrombus  Impression:  1) Sever calcific aortic stenosis. Tri-leaflet valve with heavy  calcification of the left cusp and mitral annular plane. Annular  measurements suggest she is a candidate for a 26 mm Sapien valve.  Possibly to under-inflate do to porcelain aort  2) Porcelain Ascending Aorta and Arch. Calcification is  circumferential and extends to the sinotubular junction  3) No critical CAD with patent LAD stent. Low ischemic potential  during rapid pacing. Coronary arteries sufficiently high in  sinuses  4) No Left Atrial Appendage Thrombus  CTA Abdomen/Pelvis: CTA ABDOMEN AND PELVIS  VASCULAR MEASUREMENTS PERTINENT TO TAVR:  AORTA:  Minimal Aortic Diameter - 12.6 x 10.3 mm  Severity of Aortic Calcification - moderate to severe  RIGHT PELVIS:  Right Common Iliac Artery -  Minimal Diameter - 5.6 x 5.8 mm  Tortuosity - mild  Calcification - mild to moderate  Right External Iliac Artery -  Minimal Diameter - 8.2 x 7.3 mm  Tortuosity - moderate to severe  Calcification - mild  Right Common Femoral Artery -  Minimal Diameter - 6.1 x 7.6 mm  Tortuosity - mild  Calcification -  mild  LEFT PELVIS:  Left Common Iliac Artery -  Minimal Diameter - 6.2 x 7.4 mm  Tortuosity - mild to moderate  Calcification - moderate  Left External Iliac Artery -  Minimal Diameter - 6.3 x 6.6 mm  Tortuosity - moderate to severe   Calcification - mild  Left Common Femoral Artery -  Minimal Diameter - 6.4 x 7.1 mm  Tortuosity - mild  Calcification - mild  Other Findings:  Abdomen/Pelvis: Status post cholecystectomy. 1.8 cm low  attenuation lesion in segment 7 of the liver is poorly  characterized on today's arterial phase examination, but is  unchanged in size compared to the prior study from 11/23/2007, and  most compatible with a small hepatic cyst. The appearance of the  pancreas, spleen, bilateral adrenal glands and bilateral kidneys is  unremarkable. Numerous colonic diverticula are noted, without  surrounding inflammatory changes to suggest acute diverticulitis at  this time. Trace volume of ascites in the anatomic pelvis. No  larger collection of ascites. No pneumoperitoneum. No pathologic  distension of small bowel. No definite pathologic lymphadenopathy  identified within the abdomen or pelvis. A small locule of gas non  dependently within the lumen of the urinary bladder. No Foley  balloon catheter in place at this time. Uterus and ovaries are  atrophic. A tiny umbilical hernia containing only omental fat  incidentally noted.  Musculoskeletal: There are no aggressive appearing lytic or blastic  lesions noted in the visualized portions of the skeleton. 7 mm of  anterolisthesis of L4 upon L5.  Review of the MIP images confirms the above findings.  IMPRESSION:  1. Vascular findings and measurements pertinent to potential TAVR  procedure, as detailed above. The patient does appear to have a  suitable left sided pelvic arterial access in terms of vessel size  (for low profile devices). However, there is extensive tortuosity  of the external iliac arteries bilaterally.  2. Trace volume of ascites. This may relate to passive hepatic  congestion from underlying congestive heart failure.  3. Small locule of gas noted nondependently within the lumen of  the urinary bladder. No Foley balloon catheter is  noted at this  time. This may be iatrogenic if the patient has recently been  catheterized for urinalysis. Alternatively, this could be  indicative of infection with gas forming organisms. Clinical  correlation is recommended.  4. Extensive colonic diverticulosis without findings to suggest  acute diverticulitis at this time.  5. Additional incidental findings, as above.   Pulmonary Function Tests  Baseline  FVC 1.37 L (80% predicted)  FEV1 1.37 L (112% predicted)  FEF25-75 2.65 L (352% predicted)  RV 2.29 L (100% predicted)  DLCO UNABLE TO OBTAIN  6 Minute Walk Test Results  Patient: Kaylee Schmitt  Date: 11/23/2012  Supplemental O2 during test? NO  Baseline End  Time 1500 1506  Heartrate 50 62  Dyspnea 2/10 4/10  Fatigue 2/10 4/10  O2 sat 98% 90%  Blood pressure 178/58 200/50  Patient ambulated at a slow pace for a total distance of 170 feet with 1 stops.  Ambulation was limited primarily due to oxygen sats dropping to 86% but quickly came back up.  Overall the test was tolerated fair.   STS RISK CALCULATOR: Procedure Name Isolated AVRepl Risk of Mortality 7.621% Morbidity or Mortality 32.533%  Long Length of Stay 17.123%  Short Length of Stay 13.586%  Permanent Stroke 4.702%  Prolonged Ventilation 25.297%  DSW Infection 0.118%  Renal Failure 8.691%  Reoperation  12.592%   ASSESSMENT AND PLAN:  79 year old woman with severe symptomatic aortic stenosis and class IV heart failure. She is not a surgical candidate because of a porcelain aorta. She has been evaluated by members of the multidisciplinary heart valve team, including 2 cardiac surgeons. Transcatheter aortic valve replacement is a reasonable treatment alternatives to conventional aortic valve surgery. We have discussed all options, including palliative medical therapy. The patient and her daughter both wish to pursue treatment. I have reviewed the risks of transcatheter aortic valve replacement, including vascular  injury, bleeding, infection, perivalvular regurgitation, valve embolization, stroke, myocardial infarction, cardiac perforation, cardiac tamponade, annular rupture, aortic dissection, and death. The patient understands these risks and agrees to proceed.  Multiple imaging studies including cardiac and vascular CTs, catheterization films and hemodynamic data, and echo images, have all been carefully reviewed. She appears to have suitable vascular anatomic findings for left femoral artery access. Her valve annulus size is borderline between a 23 mm and 26 mm valve. However, considering her porcelain aorta and heavy calcification at the sinotubular junction, a 23 mm valve may be most prudent. Will review further with the other team members before the procedure.  If femoral access is not successful, she would be a candidate for transit apical access. The patient would not be a candidate for a sternotomy in the case of catastrophic complication. This was all reviewed in detail with the patient and her daughter who understand.  Tonny Bollman 11/25/2012 8:39 PM

## 2012-11-26 MED ORDER — PHENYLEPHRINE HCL 10 MG/ML IJ SOLN
30.0000 ug/min | INTRAVENOUS | Status: DC
  Filled 2012-11-26 (×2): qty 2

## 2012-11-26 MED ORDER — CHLORHEXIDINE GLUCONATE 4 % EX LIQD: 30.0000 mL | CUTANEOUS | Status: DC

## 2012-11-26 MED ORDER — POTASSIUM CHLORIDE 2 MEQ/ML IV SOLN
80.0000 meq | INTRAVENOUS | Status: DC
  Filled 2012-11-26 (×2): qty 40

## 2012-11-26 MED ORDER — NOREPINEPHRINE BITARTRATE 1 MG/ML IJ SOLN
0.0000 ug/min | INTRAVENOUS | Status: DC
  Filled 2012-11-26 (×2): qty 8

## 2012-11-26 MED ORDER — VANCOMYCIN HCL 10 G IV SOLR
1250.0000 mg | INTRAVENOUS | Status: AC
  Administered 2012-11-27: 1250 mg via INTRAVENOUS
  Filled 2012-11-26 (×2): qty 1250

## 2012-11-26 MED ORDER — NITROGLYCERIN IN D5W 200-5 MCG/ML-% IV SOLN
2.0000 ug/min | INTRAVENOUS | Status: DC
  Filled 2012-11-26 (×2): qty 250

## 2012-11-26 MED ORDER — MAGNESIUM SULFATE 50 % IJ SOLN
40.0000 meq | INTRAMUSCULAR | Status: DC
  Filled 2012-11-26 (×2): qty 10

## 2012-11-26 MED ORDER — VANCOMYCIN HCL 1000 MG IV SOLR
INTRAVENOUS | Status: DC
  Filled 2012-11-26: qty 1000

## 2012-11-26 MED ORDER — SODIUM CHLORIDE 0.9 % IV SOLN
INTRAVENOUS | Status: DC
  Filled 2012-11-26 (×2): qty 30

## 2012-11-26 MED ORDER — DEXTROSE 5 % IV SOLN
1.5000 g | INTRAVENOUS | Status: AC
  Administered 2012-11-27: 1.5 g via INTRAVENOUS
  Filled 2012-11-26 (×3): qty 1.5

## 2012-11-26 MED ORDER — SODIUM CHLORIDE 0.9 % IV SOLN
INTRAVENOUS | Status: AC
  Administered 2012-11-27: 1.9 [IU]/h via INTRAVENOUS
  Filled 2012-11-26 (×2): qty 1

## 2012-11-26 MED ORDER — DOPAMINE-DEXTROSE 3.2-5 MG/ML-% IV SOLN
2.0000 ug/kg/min | INTRAVENOUS | Status: DC
  Filled 2012-11-26 (×2): qty 250

## 2012-11-26 MED ORDER — DEXMEDETOMIDINE HCL IN NACL 400 MCG/100ML IV SOLN
0.1000 ug/kg/h | INTRAVENOUS | Status: AC
  Administered 2012-11-27: 0.2 ug/kg/h via INTRAVENOUS
  Filled 2012-11-26 (×2): qty 100

## 2012-11-26 MED ORDER — DEXTROSE 5 % IV SOLN
0.5000 ug/min | INTRAVENOUS | Status: DC
  Filled 2012-11-26 (×2): qty 4

## 2012-11-26 NOTE — Progress Notes (Signed)
Anesthesia Chart Review:  Patient is a 79 year old female scheduled for TVAR on 11/27/12 by Dr. Cornelius Moras.  History inclundes severe AS, CAD s/p LAD BMS ~ '11, CHF, HTN, HLD, CKD, right hemispheric CVA '03 with mild residual left sided weakness. Primary cardiologist is Dr. Armanda Magic.  She has also been evaluated by cardiologist Dr. Excell Seltzer in the [facility] clinic.       Cardiac cath on 10/19/12 showed: 1. Calcific aortic stenosis, critical  2. Widely patent native coronary arteries (proximal LAD 30-40%, 30-50% in-stent restenosis withtin stented LAD region proximally, luminal irregularities in the mid and distal LAD, D1 patent despite being jailed, CX luminal irregularities, RCA 50% ostial, mid to proximal RCA 230-40%) 3. Moderate pulmonary hypertension  4. Heavily calcified/porcelain aorta along with heavy aortic valve and mitral annular calcification  5. Normal left ventricular systolic function with normal filling pressures of the time of cath, EF 65%  Echo on 10/12/12 showed: - Left ventricle: The cavity size was normal. Wall thickness was increased in a pattern of severe LVH. Systolic function was normal. The estimated ejection fraction was in the range of 60% to 65%. Wall motion was normal; there were no regional wall motion abnormalities. - Aortic valve: Valve mobility was severely restricted. There was severe stenosis. Mild regurgitation. Valve area: 0.53cm^2(VTI). Valve area: 0.47cm^2 (Vmax). - Mitral valve: Severely calcified annulus. Moderately calcified leaflets . - Left atrium: The atrium was moderately to severely dilated. - Pulmonary arteries: Systolic pressure was moderately increased.  Recent cardiac CT and CTA of the chest are under Results Review tab.  PFTs on 10/18/12 showed FVC 1.37 (80%), FEV1 1.37 (112%).  Preoperative EKG, CXR, and labs noted.  She will need a UA pre-operatively.    Velna Ochs [facility] Short Stay Center/Anesthesiology Phone [phone] 11/26/2012 9:52  AM

## 2012-11-27 ENCOUNTER — Encounter (HOSPITAL_COMMUNITY)
Admission: RE | Disposition: A | Discharge status: Skilled Nursing Facility | Payer: Self-pay | Source: Ambulatory Visit | Attending: Cardiovascular Disease

## 2012-11-27 ENCOUNTER — Encounter (HOSPITAL_COMMUNITY): Payer: Self-pay | Admitting: Certified Registered"

## 2012-11-27 ENCOUNTER — Inpatient Hospital Stay (HOSPITAL_COMMUNITY): Payer: Medicare Other | Admitting: Anesthesiology

## 2012-11-27 ENCOUNTER — Encounter (HOSPITAL_COMMUNITY): Payer: Self-pay | Admitting: Anesthesiology

## 2012-11-27 ENCOUNTER — Inpatient Hospital Stay (HOSPITAL_COMMUNITY)
Admission: RE | Admit: 2012-11-27 | Discharge: 2012-12-03 | DRG: 219 | Disposition: A | Discharge status: Skilled Nursing Facility | Payer: Medicare Other | Source: Ambulatory Visit | Attending: Cardiovascular Disease | Admitting: Cardiovascular Disease

## 2012-11-27 ENCOUNTER — Inpatient Hospital Stay (HOSPITAL_COMMUNITY): Payer: Medicare Other

## 2012-11-27 DIAGNOSIS — Z9861 Coronary angioplasty status: Secondary | ICD-10-CM

## 2012-11-27 DIAGNOSIS — Z8673 Personal history of transient ischemic attack (TIA), and cerebral infarction without residual deficits: Secondary | ICD-10-CM

## 2012-11-27 DIAGNOSIS — Z006 Encounter for examination for normal comparison and control in clinical research program: Secondary | ICD-10-CM

## 2012-11-27 DIAGNOSIS — I5031 Acute diastolic (congestive) heart failure: Secondary | ICD-10-CM

## 2012-11-27 DIAGNOSIS — I350 Nonrheumatic aortic (valve) stenosis: Secondary | ICD-10-CM

## 2012-11-27 DIAGNOSIS — I119 Hypertensive heart disease without heart failure: Secondary | ICD-10-CM

## 2012-11-27 DIAGNOSIS — I509 Heart failure, unspecified: Secondary | ICD-10-CM | POA: Diagnosis present

## 2012-11-27 DIAGNOSIS — I2789 Other specified pulmonary heart diseases: Secondary | ICD-10-CM | POA: Diagnosis present

## 2012-11-27 DIAGNOSIS — I69959 Hemiplegia and hemiparesis following unspecified cerebrovascular disease affecting unspecified side: Secondary | ICD-10-CM

## 2012-11-27 DIAGNOSIS — I10 Essential (primary) hypertension: Secondary | ICD-10-CM | POA: Diagnosis present

## 2012-11-27 DIAGNOSIS — M109 Gout, unspecified: Secondary | ICD-10-CM | POA: Diagnosis present

## 2012-11-27 DIAGNOSIS — I2510 Atherosclerotic heart disease of native coronary artery without angina pectoris: Secondary | ICD-10-CM | POA: Diagnosis present

## 2012-11-27 DIAGNOSIS — I359 Nonrheumatic aortic valve disorder, unspecified: Principal | ICD-10-CM

## 2012-11-27 DIAGNOSIS — D62 Acute posthemorrhagic anemia: Secondary | ICD-10-CM | POA: Diagnosis not present

## 2012-11-27 DIAGNOSIS — I130 Hypertensive heart and chronic kidney disease with heart failure and stage 1 through stage 4 chronic kidney disease, or unspecified chronic kidney disease: Secondary | ICD-10-CM | POA: Diagnosis present

## 2012-11-27 DIAGNOSIS — I700 Atherosclerosis of aorta: Secondary | ICD-10-CM | POA: Diagnosis present

## 2012-11-27 DIAGNOSIS — Z952 Presence of prosthetic heart valve: Secondary | ICD-10-CM

## 2012-11-27 DIAGNOSIS — R32 Unspecified urinary incontinence: Secondary | ICD-10-CM | POA: Diagnosis not present

## 2012-11-27 DIAGNOSIS — N189 Chronic kidney disease, unspecified: Secondary | ICD-10-CM | POA: Diagnosis present

## 2012-11-27 DIAGNOSIS — I5033 Acute on chronic diastolic (congestive) heart failure: Secondary | ICD-10-CM | POA: Diagnosis present

## 2012-11-27 DIAGNOSIS — E785 Hyperlipidemia, unspecified: Secondary | ICD-10-CM | POA: Diagnosis present

## 2012-11-27 HISTORY — DX: Presence of prosthetic heart valve: Z95.2

## 2012-11-27 HISTORY — PX: TRANSCATHETER AORTIC VALVE REPLACEMENT, TRANSFEMORAL: SHX6400

## 2012-11-27 HISTORY — PX: INTRAOPERATIVE TRANSESOPHAGEAL ECHOCARDIOGRAM: SHX5062

## 2012-11-27 LAB — POCT I-STAT 3, ART BLOOD GAS (G3+)
Acid-base deficit: 2 mmol/L (ref 0.0–2.0)
Acid-base deficit: 2 mmol/L (ref 0.0–2.0)
Bicarbonate: 23.6 mEq/L (ref 20.0–24.0)
Bicarbonate: 23.7 mEq/L (ref 20.0–24.0)
Bicarbonate: 24 mEq/L (ref 20.0–24.0)
Bicarbonate: 23.4 mEq/L (ref 20.0–24.0)
O2 Saturation: 100 %
O2 Saturation: 98 %
Patient temperature: 36.7
TCO2: 25 mmol/L (ref 0–100)
TCO2: 25 mmol/L (ref 0–100)
pCO2 arterial: 35.3 mmHg (ref 35.0–45.0)
pCO2 arterial: 41.6 mmHg (ref 35.0–45.0)
pCO2 arterial: 41.5 mmHg (ref 35.0–45.0)
pH, Arterial: 7.451 — ABNORMAL HIGH (ref 7.350–7.450)
pH, Arterial: 7.371 (ref 7.350–7.450)
pH, Arterial: 7.356 (ref 7.350–7.450)
pO2, Arterial: 109 mmHg — ABNORMAL HIGH (ref 80.0–100.0)

## 2012-11-27 LAB — POCT I-STAT 4, (NA,K, GLUC, HGB,HCT)
Glucose, Bld: 124 mg/dL — ABNORMAL HIGH (ref 70–99)
Glucose, Bld: 116 mg/dL — ABNORMAL HIGH (ref 70–99)
Glucose, Bld: 99 mg/dL (ref 70–99)
HCT: 28 % — ABNORMAL LOW (ref 36.0–46.0)
HCT: 21 % — ABNORMAL LOW (ref 36.0–46.0)
HCT: 33 % — ABNORMAL LOW (ref 36.0–46.0)
Hemoglobin: 9.5 g/dL — ABNORMAL LOW (ref 12.0–15.0)
Hemoglobin: 7.1 g/dL — ABNORMAL LOW (ref 12.0–15.0)
Hemoglobin: 7.1 g/dL — ABNORMAL LOW (ref 12.0–15.0)
Hemoglobin: 9.2 g/dL — ABNORMAL LOW (ref 12.0–15.0)
Potassium: 3.8 mEq/L (ref 3.5–5.1)
Potassium: 4.4 mEq/L (ref 3.5–5.1)
Potassium: 3.7 mEq/L (ref 3.5–5.1)
Sodium: 140 mEq/L (ref 135–145)
Sodium: 140 mEq/L (ref 135–145)

## 2012-11-27 LAB — CBC
HCT: 31.8 % — ABNORMAL LOW (ref 36.0–46.0)
Hemoglobin: 10.8 g/dL — ABNORMAL LOW (ref 12.0–15.0)
MCH: 29.3 pg (ref 26.0–34.0)
MCH: 29.2 pg (ref 26.0–34.0)
MCHC: 34 g/dL (ref 30.0–36.0)
MCHC: 33.9 g/dL (ref 30.0–36.0)
MCV: 86.4 fL (ref 78.0–100.0)
Platelets: 101 10*3/uL — ABNORMAL LOW (ref 150–400)
RDW: 15 % (ref 11.5–15.5)

## 2012-11-27 LAB — POCT I-STAT, CHEM 8
BUN: 24 mg/dL — ABNORMAL HIGH (ref 6–23)
Calcium, Ion: 1.23 mmol/L (ref 1.13–1.30)
Creatinine, Ser: 1.2 mg/dL — ABNORMAL HIGH (ref 0.50–1.10)
Glucose, Bld: 106 mg/dL — ABNORMAL HIGH (ref 70–99)
TCO2: 21 mmol/L (ref 0–100)

## 2012-11-27 LAB — MAGNESIUM: Magnesium: 1.7 mg/dL (ref 1.5–2.5)

## 2012-11-27 LAB — GLUCOSE, CAPILLARY

## 2012-11-27 SURGERY — Surgical Case
Anesthesia: *Unknown

## 2012-11-27 SURGERY — IMPLANTATION, AORTIC VALVE, TRANSCATHETER, FEMORAL APPROACH
Anesthesia: General | Site: Chest | Wound class: Clean

## 2012-11-27 MED ORDER — PROTAMINE SULFATE 10 MG/ML IV SOLN
INTRAVENOUS | Status: DC | PRN
  Administered 2012-11-27: 20 mg via INTRAVENOUS
  Administered 2012-11-27: 10 mg via INTRAVENOUS
  Administered 2012-11-27 (×2): 20 mg via INTRAVENOUS

## 2012-11-27 MED ORDER — IODIXANOL 320 MG/ML IV SOLN
INTRAVENOUS | Status: DC | PRN
  Administered 2012-11-27: 111.8 mL via INTRA_ARTERIAL

## 2012-11-27 MED ORDER — DEXTROSE 5 % IV SOLN
1.5000 g | INTRAVENOUS | Status: AC
  Administered 2012-11-28 – 2012-11-29 (×2): 1.5 g via INTRAVENOUS
  Filled 2012-11-27 (×3): qty 1.5

## 2012-11-27 MED ORDER — SERTRALINE HCL 100 MG PO TABS
100.0000 mg | ORAL_TABLET | Freq: Every morning | ORAL | Status: DC
  Administered 2012-11-28 – 2012-12-03 (×6): 100 mg via ORAL
  Filled 2012-11-27 (×6): qty 1

## 2012-11-27 MED ORDER — MORPHINE SULFATE 2 MG/ML IJ SOLN: 1.0000 mg | INTRAMUSCULAR | Status: AC | PRN

## 2012-11-27 MED ORDER — SODIUM CHLORIDE 0.9 % IV SOLN
INTRAVENOUS | Status: DC | PRN
  Administered 2012-11-27: 10:00:00 via INTRAVENOUS

## 2012-11-27 MED ORDER — OXYCODONE HCL 5 MG PO TABS: 5.0000 mg | ORAL_TABLET | ORAL | Status: DC | PRN

## 2012-11-27 MED ORDER — ROCURONIUM BROMIDE 100 MG/10ML IV SOLN
INTRAVENOUS | Status: DC | PRN
  Administered 2012-11-27: 10 mg via INTRAVENOUS
  Administered 2012-11-27: 50 mg via INTRAVENOUS
  Administered 2012-11-27: 40 mg via INTRAVENOUS

## 2012-11-27 MED ORDER — MIDAZOLAM HCL 5 MG/5ML IJ SOLN
INTRAMUSCULAR | Status: DC | PRN
  Administered 2012-11-27: 0.5 mg via INTRAVENOUS
  Administered 2012-11-27: 3 mg via INTRAVENOUS
  Administered 2012-11-27: 2.5 mg via INTRAVENOUS

## 2012-11-27 MED ORDER — ALBUMIN HUMAN 5 % IV SOLN: 250.0000 mL | INTRAVENOUS | Status: DC | PRN

## 2012-11-27 MED ORDER — ACETAMINOPHEN 160 MG/5ML PO SOLN
650.0000 mg | Freq: Once | ORAL | Status: DC
  Administered 2012-11-27: 650 mg
  Filled 2012-11-27: qty 20.3

## 2012-11-27 MED ORDER — ASPIRIN 81 MG PO CHEW: 324.0000 mg | CHEWABLE_TABLET | Freq: Every day | ORAL | Status: DC

## 2012-11-27 MED ORDER — BISACODYL 10 MG RE SUPP: 10.0000 mg | Freq: Every day | RECTAL | Status: DC

## 2012-11-27 MED ORDER — ACETAMINOPHEN 500 MG PO TABS: 1000.0000 mg | ORAL_TABLET | Freq: Once | ORAL | Status: AC

## 2012-11-27 MED ORDER — MIDAZOLAM HCL 2 MG/2ML IJ SOLN: 2.0000 mg | INTRAMUSCULAR | Status: DC | PRN

## 2012-11-27 MED ORDER — POTASSIUM CHLORIDE 10 MEQ/50ML IV SOLN
10.0000 meq | INTRAVENOUS | Status: AC
  Administered 2012-11-27 (×3): 10 meq via INTRAVENOUS

## 2012-11-27 MED ORDER — DEXMEDETOMIDINE HCL IN NACL 200 MCG/50ML IV SOLN: 0.1000 ug/kg/h | INTRAVENOUS | Status: DC

## 2012-11-27 MED ORDER — SODIUM CHLORIDE 0.9 % IV SOLN: INTRAVENOUS | Status: DC

## 2012-11-27 MED ORDER — SODIUM CHLORIDE 0.9 % IJ SOLN: 3.0000 mL | INTRAMUSCULAR | Status: DC | PRN

## 2012-11-27 MED ORDER — 0.9 % SODIUM CHLORIDE (POUR BTL) OPTIME
TOPICAL | Status: DC | PRN
  Administered 2012-11-27: 4000 mL

## 2012-11-27 MED ORDER — ACETAMINOPHEN 160 MG/5ML PO SOLN
1000.0000 mg | Freq: Four times a day (QID) | ORAL | Status: AC
  Filled 2012-11-27: qty 40

## 2012-11-27 MED ORDER — FENTANYL CITRATE 0.05 MG/ML IJ SOLN
INTRAMUSCULAR | Status: DC | PRN
  Administered 2012-11-27: 25 ug via INTRAVENOUS
  Administered 2012-11-27: 175 ug via INTRAVENOUS
  Administered 2012-11-27: 25 ug via INTRAVENOUS

## 2012-11-27 MED ORDER — LACTATED RINGERS IV SOLN: INTRAVENOUS | Status: DC

## 2012-11-27 MED ORDER — HEPARIN SODIUM (PORCINE) 1000 UNIT/ML IJ SOLN
INTRAMUSCULAR | Status: DC | PRN
  Administered 2012-11-27: 7000 [IU] via INTRAVENOUS

## 2012-11-27 MED ORDER — EPINEPHRINE HCL 1 MG/ML IJ SOLN
INTRAMUSCULAR | Status: AC
  Filled 2012-11-27: qty 5

## 2012-11-27 MED ORDER — EPINEPHRINE HCL 1 MG/ML IJ SOLN
INTRAMUSCULAR | Status: AC
  Filled 2012-11-27: qty 1

## 2012-11-27 MED ORDER — ARTIFICIAL TEARS OP OINT
TOPICAL_OINTMENT | OPHTHALMIC | Status: DC | PRN
  Administered 2012-11-27: 1 via OPHTHALMIC

## 2012-11-27 MED ORDER — SODIUM CHLORIDE 0.9 % IV SOLN
250.0000 mL | INTRAVENOUS | Status: AC
  Administered 2012-11-27: 1000 mL via INTRAVENOUS

## 2012-11-27 MED ORDER — PROPOFOL 10 MG/ML IV BOLUS
INTRAVENOUS | Status: DC | PRN
  Administered 2012-11-27: 40 mg via INTRAVENOUS

## 2012-11-27 MED ORDER — SODIUM CHLORIDE 0.9 % IR SOLN
Status: DC | PRN
  Administered 2012-11-27: 08:00:00

## 2012-11-27 MED ORDER — METOPROLOL TARTRATE 1 MG/ML IV SOLN: 2.5000 mg | INTRAVENOUS | Status: DC | PRN

## 2012-11-27 MED ORDER — 0.9 % SODIUM CHLORIDE (POUR BTL) OPTIME
TOPICAL | Status: DC | PRN
  Administered 2012-11-27: 1000 mL

## 2012-11-27 MED ORDER — MORPHINE SULFATE 2 MG/ML IJ SOLN: 2.0000 mg | INTRAMUSCULAR | Status: DC | PRN

## 2012-11-27 MED ORDER — ONDANSETRON HCL 4 MG/2ML IJ SOLN
4.0000 mg | Freq: Four times a day (QID) | INTRAMUSCULAR | Status: DC | PRN
  Administered 2012-11-28: 4 mg via INTRAVENOUS
  Filled 2012-11-27: qty 2

## 2012-11-27 MED ORDER — SODIUM CHLORIDE 0.9 % IV SOLN: 250.0000 mL | INTRAVENOUS | Status: DC

## 2012-11-27 MED ORDER — ALBUMIN HUMAN 5 % IV SOLN
INTRAVENOUS | Status: DC | PRN
  Administered 2012-11-27: 07:00:00 via INTRAVENOUS

## 2012-11-27 MED ORDER — HEPARIN SODIUM (PORCINE) 5000 UNIT/ML IJ SOLN
INTRAMUSCULAR | Status: DC | PRN
  Administered 2012-11-27 (×2)

## 2012-11-27 MED ORDER — LACTATED RINGERS IV SOLN: 500.0000 mL | Freq: Once | INTRAVENOUS | Status: AC | PRN

## 2012-11-27 MED ORDER — ACETAMINOPHEN 650 MG RE SUPP: 650.0000 mg | Freq: Once | RECTAL | Status: DC

## 2012-11-27 MED ORDER — INSULIN ASPART 100 UNIT/ML ~~LOC~~ SOLN
0.0000 [IU] | SUBCUTANEOUS | Status: DC
  Administered 2012-11-27 – 2012-11-28 (×2): 2 [IU] via SUBCUTANEOUS

## 2012-11-27 MED ORDER — ASPIRIN EC 325 MG PO TBEC
325.0000 mg | DELAYED_RELEASE_TABLET | Freq: Every day | ORAL | Status: DC
  Administered 2012-11-28: 325 mg via ORAL
  Filled 2012-11-27: qty 1

## 2012-11-27 MED ORDER — VANCOMYCIN HCL IN DEXTROSE 1-5 GM/200ML-% IV SOLN
1000.0000 mg | INTRAVENOUS | Status: AC
  Administered 2012-11-28 – 2012-11-29 (×2): 1000 mg via INTRAVENOUS
  Filled 2012-11-27 (×2): qty 200

## 2012-11-27 MED ORDER — DOCUSATE SODIUM 100 MG PO CAPS
200.0000 mg | ORAL_CAPSULE | Freq: Every day | ORAL | Status: DC
Start: 2012-11-28 — End: 2012-12-03
  Administered 2012-11-28: 200 mg via ORAL
  Filled 2012-11-27 (×5): qty 2

## 2012-11-27 MED ORDER — LIDOCAINE HCL (CARDIAC) 20 MG/ML IV SOLN
INTRAVENOUS | Status: DC | PRN
  Administered 2012-11-27: 70 mg via INTRAVENOUS

## 2012-11-27 MED ORDER — NITROGLYCERIN IN D5W 200-5 MCG/ML-% IV SOLN
2.0000 ug/min | INTRAVENOUS | Status: DC
  Filled 2012-11-27 (×2): qty 250

## 2012-11-27 MED ORDER — PANTOPRAZOLE SODIUM 40 MG PO TBEC
40.0000 mg | DELAYED_RELEASE_TABLET | Freq: Every day | ORAL | Status: DC
  Administered 2012-11-29 – 2012-12-03 (×5): 40 mg via ORAL
  Filled 2012-11-27 (×5): qty 1

## 2012-11-27 MED ORDER — ACETAMINOPHEN 500 MG PO TABS
1000.0000 mg | ORAL_TABLET | Freq: Four times a day (QID) | ORAL | Status: AC
  Administered 2012-11-27 – 2012-12-02 (×16): 1000 mg via ORAL
  Filled 2012-11-27 (×14): qty 2
  Filled 2012-11-27: qty 1
  Filled 2012-11-27 (×6): qty 2

## 2012-11-27 MED ORDER — FAMOTIDINE IN NACL 20-0.9 MG/50ML-% IV SOLN
20.0000 mg | Freq: Two times a day (BID) | INTRAVENOUS | Status: DC
  Administered 2012-11-27: 20 mg via INTRAVENOUS

## 2012-11-27 MED ORDER — SODIUM CHLORIDE 0.9 % IJ SOLN
3.0000 mL | Freq: Two times a day (BID) | INTRAMUSCULAR | Status: DC
  Administered 2012-11-28: 3 mL via INTRAVENOUS

## 2012-11-27 MED ORDER — LACTATED RINGERS IV SOLN
INTRAVENOUS | Status: DC | PRN
  Administered 2012-11-27 (×2): via INTRAVENOUS

## 2012-11-27 MED ORDER — BISACODYL 5 MG PO TBEC
10.0000 mg | DELAYED_RELEASE_TABLET | Freq: Every day | ORAL | Status: DC
  Administered 2012-11-28: 10 mg via ORAL
  Filled 2012-11-27 (×2): qty 2

## 2012-11-27 MED ORDER — HEPARIN (PORCINE) IN NACL 2-0.9 UNIT/ML-% IJ SOLN
INTRAMUSCULAR | Status: DC | PRN
  Administered 2012-11-27: 500 mL via INTRAVENOUS

## 2012-11-27 MED ORDER — SODIUM CHLORIDE 0.45 % IV SOLN
INTRAVENOUS | Status: DC
  Administered 2012-11-27: 12:00:00 via INTRAVENOUS

## 2012-11-27 SURGICAL SUPPLY — 121 items
ADAPTER CARDIOPLEGIA (MISCELLANEOUS) IMPLANT
ADH SKN CLS APL DERMABOND .7 (GAUZE/BANDAGES/DRESSINGS) ×1
ANTEGRADE CPLG (MISCELLANEOUS) IMPLANT
ATTRACTOMAT 16X20 MAGNETIC DRP (DRAPES) IMPLANT
BAG BANDED W/RUBBER/TAPE 36X54 (MISCELLANEOUS) ×2 IMPLANT
BAG DECANTER FOR FLEXI CONT (MISCELLANEOUS) ×6 IMPLANT
BAG EQP BAND 135X91 W/RBR TAPE (MISCELLANEOUS) ×1
BAG SNAP BAND KOVER 36X36 (MISCELLANEOUS) ×4 IMPLANT
BLADE STERNUM SYSTEM 6 (BLADE) IMPLANT
BLADE SURG ROTATE 9660 (MISCELLANEOUS) IMPLANT
CABLE PACING FASLOC BIEGE (MISCELLANEOUS) ×2 IMPLANT
CABLE PACING FASLOC BLUE (MISCELLANEOUS) ×2 IMPLANT
CANISTER SUCTION 2500CC (MISCELLANEOUS) IMPLANT
CANNULA FEM VENOUS REMOTE 22FR (CANNULA) IMPLANT
CANNULA FEMORAL ART 14 SM (MISCELLANEOUS) IMPLANT
CANNULA GUNDRY RCSP 15FR (MISCELLANEOUS) IMPLANT
CANNULA OPTISITE PERFUSION 16F (CANNULA) IMPLANT
CANNULA OPTISITE PERFUSION 18F (CANNULA) IMPLANT
CANNULA SOFTFLOW AORTIC 7M21FR (CANNULA) IMPLANT
CANNULA VENOUS LOW PROF 34X46 (CANNULA) IMPLANT
CATH DIAG EXPO 6F VENT PIG 145 (CATHETERS) ×6 IMPLANT
CATH HEART VENT LEFT (CATHETERS) IMPLANT
CATH S G BIP PACING (SET/KITS/TRAYS/PACK) ×4 IMPLANT
CATH SOFT-VU 4F 65 STRAIGHT (CATHETERS) ×1 IMPLANT
CATH SOFT-VU STRAIGHT 4F 65CM (CATHETERS) ×2
CLIP TI MEDIUM 24 (CLIP) ×2 IMPLANT
CLIP TI WIDE RED SMALL 24 (CLIP) ×2 IMPLANT
CLOTH BEACON ORANGE TIMEOUT ST (SAFETY) ×2 IMPLANT
CONN ST 1/4X3/8  BEN (MISCELLANEOUS)
CONN ST 1/4X3/8 BEN (MISCELLANEOUS) IMPLANT
CONNECTOR 1/2X3/8X1/2 3 WAY (MISCELLANEOUS)
CONNECTOR 1/2X3/8X1/2 3WAY (MISCELLANEOUS) IMPLANT
CONT SPEC 4OZ CLIKSEAL STRL BL (MISCELLANEOUS) ×4 IMPLANT
COVER DOME SNAP 22 D (MISCELLANEOUS) ×2 IMPLANT
COVER MAYO STAND STRL (DRAPES) ×2 IMPLANT
COVER PROBE W GEL 5X96 (DRAPES) IMPLANT
COVER SURGICAL LIGHT HANDLE (MISCELLANEOUS) ×2 IMPLANT
COVER TABLE BACK 60X90 (DRAPES) ×2 IMPLANT
CRADLE DONUT ADULT HEAD (MISCELLANEOUS) ×2 IMPLANT
DERMABOND ADVANCED (GAUZE/BANDAGES/DRESSINGS) ×1
DERMABOND ADVANCED .7 DNX12 (GAUZE/BANDAGES/DRESSINGS) ×1 IMPLANT
DRAIN CHANNEL 28F RND 3/8 FF (WOUND CARE) IMPLANT
DRAIN CHANNEL 32F RND 10.7 FF (WOUND CARE) IMPLANT
DRAPE INCISE IOBAN 66X45 STRL (DRAPES) IMPLANT
DRAPE SLUSH/WARMER DISC (DRAPES) ×2 IMPLANT
DRAPE TABLE COVER HEAVY DUTY (DRAPES) ×2 IMPLANT
DRSG TEGADERM 4X4.75 (GAUZE/BANDAGES/DRESSINGS) ×2 IMPLANT
ELECT REM PT RETURN 9FT ADLT (ELECTROSURGICAL) ×4
ELECTRODE REM PT RTRN 9FT ADLT (ELECTROSURGICAL) ×2 IMPLANT
FELT TEFLON 6X6 (MISCELLANEOUS) ×4 IMPLANT
FEMORAL VENOUS CANN RAP (CANNULA) IMPLANT
GLOVE BIOGEL PI IND STRL 6.5 (GLOVE) ×1 IMPLANT
GLOVE BIOGEL PI INDICATOR 6.5 (GLOVE) ×1
GLOVE ECLIPSE 7.5 STRL STRAW (GLOVE) ×2 IMPLANT
GLOVE ECLIPSE 8.0 STRL XLNG CF (GLOVE) ×4 IMPLANT
GLOVE EUDERMIC 7 POWDERFREE (GLOVE) ×2 IMPLANT
GLOVE ORTHO TXT STRL SZ7.5 (GLOVE) ×2 IMPLANT
GOWN PREVENTION PLUS XLARGE (GOWN DISPOSABLE) ×12 IMPLANT
GOWN STRL NON-REIN LRG LVL3 (GOWN DISPOSABLE) ×6 IMPLANT
GUIDEWIRE SAF TJ AMPL .035X180 (WIRE) ×2 IMPLANT
GUIDEWIRE SAFE TJ AMPLATZ EXST (WIRE) ×2 IMPLANT
HEMOSTAT POWDER SURGIFOAM 1G (HEMOSTASIS) IMPLANT
INSERT FOGARTY 61MM (MISCELLANEOUS) IMPLANT
INSERT FOGARTY XLG (MISCELLANEOUS) IMPLANT
KIT BASIN OR (CUSTOM PROCEDURE TRAY) ×2 IMPLANT
KIT DILATOR VASC 18G NDL (KITS) IMPLANT
KIT ROOM TURNOVER OR (KITS) ×2 IMPLANT
KIT SUCTION CATH 14FR (SUCTIONS) ×4 IMPLANT
LEAD PACING MYOCARDI (MISCELLANEOUS) IMPLANT
LEFT HEART KIT ×2 IMPLANT
NEEDLE PERC 18GX7CM (NEEDLE) ×4 IMPLANT
NS IRRIG 1000ML POUR BTL (IV SOLUTION) ×12 IMPLANT
PACK AORTA (CUSTOM PROCEDURE TRAY) ×2 IMPLANT
PAD ARMBOARD 7.5X6 YLW CONV (MISCELLANEOUS) ×4 IMPLANT
PAD ELECT DEFIB RADIOL ZOLL (MISCELLANEOUS) ×2 IMPLANT
PATCH TACHOSII LRG 9.5X4.8 (VASCULAR PRODUCTS) IMPLANT
SCALPEL PROTECTED #11 DISP (BLADE) ×2 IMPLANT
SET CANNULATION TOURNIQUET (MISCELLANEOUS) ×2 IMPLANT
SHEATH PINNACLE 6F 10CM (SHEATH) ×6 IMPLANT
SPONGE GAUZE 4X4 12PLY (GAUZE/BANDAGES/DRESSINGS) ×2 IMPLANT
SPONGE LAP 4X18 X RAY DECT (DISPOSABLE) ×2 IMPLANT
STOPCOCK MORSE 400PSI 3WAY (MISCELLANEOUS) ×2 IMPLANT
SUT ETHIBOND 2 0 SH (SUTURE) ×2
SUT ETHIBOND 2 0 SH 36X2 (SUTURE) ×1 IMPLANT
SUT ETHIBOND X763 2 0 SH 1 (SUTURE) ×4 IMPLANT
SUT GORETEX 5 0 TT13 24 (SUTURE) ×2 IMPLANT
SUT GORETEX CV-5 36 IN (SUTURE) IMPLANT
SUT GORETEX CV4 TH-18 (SUTURE) ×4 IMPLANT
SUT MNCRL AB 3-0 PS2 18 (SUTURE) ×2 IMPLANT
SUT PDS AB 1 CTX 36 (SUTURE) IMPLANT
SUT PROLENE 2 0 MH 48 (SUTURE) ×6 IMPLANT
SUT PROLENE 3 0 SH1 36 (SUTURE) ×2 IMPLANT
SUT PROLENE 4 0 RB 1 (SUTURE)
SUT PROLENE 4-0 RB1 .5 CRCL 36 (SUTURE) IMPLANT
SUT PROLENE 5 0 C 1 36 (SUTURE) IMPLANT
SUT PROLENE 6 0 C 1 30 (SUTURE) ×4 IMPLANT
SUT SILK  1 MH (SUTURE) ×1
SUT SILK 1 MH (SUTURE) ×1 IMPLANT
SUT SILK 1 TIES 10X30 (SUTURE) ×2 IMPLANT
SUT SILK 2 0 SH CR/8 (SUTURE) ×2 IMPLANT
SUT TEM PAC WIRE 2 0 SH (SUTURE) IMPLANT
SUT VIC AB 2-0 CTX 36 (SUTURE) IMPLANT
SUT VIC AB 3-0 SH 8-18 (SUTURE) ×4 IMPLANT
SYR 30ML LL (SYRINGE) ×8 IMPLANT
SYR 50ML LL SCALE MARK (SYRINGE) ×6 IMPLANT
SYR MEDRAD MARK V 150ML (SYRINGE) ×2 IMPLANT
SYRINGE 10CC LL (SYRINGE) ×8 IMPLANT
SYSTEM SAHARA CHEST DRAIN ATS (WOUND CARE) ×2 IMPLANT
TAPE CLOTH SURG 4X10 WHT LF (GAUZE/BANDAGES/DRESSINGS) ×2 IMPLANT
TOWEL OR 17X24 6PK STRL BLUE (TOWEL DISPOSABLE) ×6 IMPLANT
TOWEL OR 17X26 10 PK STRL BLUE (TOWEL DISPOSABLE) ×4 IMPLANT
TRAY FOLEY IC TEMP SENS 14FR (CATHETERS) ×2 IMPLANT
TUBE SUCT INTRACARD DLP 20F (MISCELLANEOUS) IMPLANT
TUBING ART PRESS 12 MALE/MALE (MISCELLANEOUS) ×6 IMPLANT
TUBING HIGH PRESSURE 120CM (CONNECTOR) ×2 IMPLANT
UNDERPAD 30X30 INCONTINENT (UNDERPADS AND DIAPERS) ×2 IMPLANT
VALVE HRT TRANSCATH NOVAFL 23M (Valve) ×2 IMPLANT
VENT LEFT HEART 12002 (CATHETERS)
WATER STERILE IRR 1000ML POUR (IV SOLUTION) ×4 IMPLANT
WIRE .035 3MM-J 145CM (WIRE) ×2 IMPLANT
WIRE AMPLATZ SS-J .035X180CM (WIRE) ×2 IMPLANT

## 2012-11-27 NOTE — Anesthesia Postprocedure Evaluation (Signed)
  Anesthesia Post-op Note  Patient: Kaylee Schmitt  Procedure(s) Performed: Procedure(s): TRANSCATHETER AORTIC VALVE REPLACEMENT, TRANSFEMORAL (N/A) INTRAOPERATIVE TRANSESOPHAGEAL ECHOCARDIOGRAM (N/A)  Patient Location: SICU  Anesthesia Type:General  Level of Consciousness: Patient remains intubated per anesthesia plan  Airway and Oxygen Therapy: Patient remains intubated per anesthesia plan and Patient placed on Ventilator (see vital sign flow sheet for setting)  Post-op Pain: none  Post-op Assessment: Post-op Vital signs reviewed, Patient's Cardiovascular Status Stable and Respiratory Function Stable  Post-op Vital Signs: Reviewed  Complications: No apparent anesthesia complications

## 2012-11-27 NOTE — H&P (Signed)
Patient ID:  Kaylee Schmitt  MRN: [id number]  DOB/AGE: [birth year] 79 y.o.  Primary Care Physician:TURNER,TRACI R, MD  Primary Cardiologist: Dr Mayford Knife   HPI: 79 year old woman presenting for TAVR for treatment of severe symptomatic aortic stenosis. She is a retired Engineer, civil (consulting), originally from [place]. The patient is widowed and lives with her daughter in [place]. 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 She's also had congestive heart failure and coronary artery disease. 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 She underwent PCI for treatment of her coronary artery disease. She was referred to [facility] for consideration of transcatheter aortic valve replacement through a clinical trial, but she ultimately did not complete that evaluation.   The patient has been extensively evaluated by Doctors Cornelius Moras, [place], and [facility]. She has completed her evaluation for transcatheter aortic valve replacement.   From a symptomatic perspective she continues to complain of dyspnea with minimal exertion. Her shortness of breath has been progressive over the last few months. Her diuretics were recently increased. She also has "fullness" in her chest with activity. She has not had lightheadedness, syncope, orthopnea, or PND. She's also limited by left-sided weakness and osteoarthritis in her right knee. She walks either with a walker or cane. However, her primary limitation is shortness of breath.   Past Medical History   Diagnosis  Date   .  Aortic stenosis    .  CHF (congestive heart failure)    .  CAD (coronary artery disease)  10/12/2012     Cath 2011 Calcified LM, 80% mid LAD, dominant circ without sig disease, Ostial nondominant RCA. Severe AS Liberte stent x 2 2011 Dr. Amil Amen   .  History of CVA (cerebrovascular accident)      2003 while living in [place]. Right brain with left  hemiparesis   .  Gout  10/12/2012   .  Hypertensive heart disease  10/12/2012   .  Hyperlipidemia  10/12/2012   .  Hypertension    .  Chronic kidney disease    .  Shortness of breath    .  Stroke     Past Surgical History   Procedure  Laterality  Date   .  Coronary stent placement     .  Cholecystectomy     .  Appendectomy     .  Cataract extraction     .  Cardiac catheterization      Family History   Problem  Relation  Age of Onset   .  Cancer - Prostate  Father    .  CAD  Mother     History    Social History   .  Marital Status:  Widowed     Spouse Name:  N/A     Number of Children:  8   .  Years of Education:  N/A    Occupational History   .  Not on file.    Social History Main Topics   .  Smoking status:  Never Smoker   .  Smokeless tobacco:  Not on file   .  Alcohol Use:  No   .  Drug Use:  No   .  Sexual Activity:  Not on file    Other Topics  Concern   .  Not on file    Social History Narrative    Widow. Lives with daughter.  Allergies   Allergen  Reactions   .  Adhesive [Tape]  Rash     Paper tape OK.   Marland Kitchen  Penicillins  Itching and Rash   .  Sulfa Antibiotics  Itching and Rash    ROS:  General: no fevers/chills/night sweats, positive for weight loss  Eyes: no blurry vision, diplopia, or amaurosis  ENT: no sore throat, positive for hearing loss  Resp: no cough, wheezing, or hemoptysis  CV: no edema or palpitations  GI: no abdominal pain, nausea, vomiting, diarrhea, or constipation  GU: no dysuria, frequency, or hematuria  Skin: no rash  Neuro: no headache, numbness, tingling, or weakness of extremities, positive for residual weakness related to old stroke  Musculoskeletal: Positive for knee pain  Heme: no bleeding, DVT, or easy bruising  Endo: no polydipsia or polyuria   BP 156/42  Pulse 55  Resp 18  Ht 4\' 11"  (1.499 m)  Wt 130 lb (58.968 kg)  BMI 26.24 kg/m2  SpO2 98%  PHYSICAL EXAM:  Pt is alert and oriented, WD, WN, pleasant elderly  woman in no distress.  HEENT: normal  Neck: JVP normal. Carotid upstrokes delayed with bilateral bruits. No thyromegaly.  Lungs: equal expansion, clear bilaterally  CV: Apex is discrete and nondisplaced, RRR with grade 3/6 harsh crescendo decrescendo murmur at the right upper sternal border  Abd: soft, NT, +BS, no bruit, no hepatosplenomegaly  Back: no CVA tenderness  Ext: no C/C/E  DP/PT pulses intact and =  Skin: warm and dry without rash   2D ECHO:  Left ventricle: The cavity size was normal. Wall thickness was increased in a pattern of severe LVH. Systolic function was normal. The estimated ejection fraction was in the range of 60% to 65%. Wall motion was normal; there were no regional wall motion abnormalities.  ------------------------------------------------------------ Aortic valve: Trileaflet; severely thickened, severely calcified leaflets. Valve mobility was severely restricted. Doppler: There was severe stenosis. Mild regurgitation. VTI ratio of LVOT to aortic valve: 0.17. Valve area: 0.53cm^2(VTI). Peak velocity ratio of LVOT to aortic valve: 0.15. Valve area: 0.47cm^2 (Vmax). Mean gradient: 38mm Hg (S). Peak gradient: 61mm Hg (S).  ------------------------------------------------------------ Aorta: Aortic root: The aortic root was normal in size.  ------------------------------------------------------------ Mitral valve: Submitral structures heavily calcified. Severely calcified annulus. Moderately calcified leaflets . Mobility was not restricted. Doppler: Transvalvular velocity was within the normal range. There was no evidence for stenosis. No regurgitation.  ------------------------------------------------------------ Left atrium: The atrium was moderately to severely dilated.  ------------------------------------------------------------ Right ventricle: The cavity size was normal. Wall thickness was normal. Systolic function was  normal.  ------------------------------------------------------------ Pulmonic valve: Not well visualized. Doppler: Transvalvular velocity was within the normal range. There was no evidence for stenosis.  ------------------------------------------------------------ Tricuspid valve: Structurally normal valve. Doppler: Transvalvular velocity was within the normal range. Mild regurgitation.  ------------------------------------------------------------ Pulmonary artery: Systolic pressure was moderately increased.  ------------------------------------------------------------ Right atrium: The atrium was normal in size.  ------------------------------------------------------------ Pericardium: There was no pericardial effusion.  ------------------------------------------------------------ Systemic veins: Inferior vena cava: The vessel was normal in size.  ------------------------------------------------------------  2D measurements Normal Doppler measurements Normal Left ventricle LVOT LVID ED, 29.3 mm 43-52 Peak vel, S 58 cm/s ------ chord, PLAX VTI, S 18.9 cm ------ LVID ES, 22.4 mm 23-38 Aortic valve chord, PLAX Peak vel, S 390 cm/s ------ FS, chord, 24 % >29 Mean vel, S 294 cm/s ------ PLAX VTI, S 111 cm ------ LVPW, ED 16.9 mm ------ Mean 38 mm ------ IVS/LVPW 0.82 <1.3 gradient, S Hg ratio, ED  Peak 61 mm ------ Ventricular septum gradient, S Hg IVS, ED 13.9 mm ------ VTI ratio 0.17 ------ LVOT LVOT/AV Diam, S 20 mm ------ Area, VTI 0.53 cm^2 ------ Area 3.14 cm^2 ------ Peak vel 0.15 ------ Aorta ratio, Root diam, 28 mm ------ LVOT/AV ED Area, Vmax 0.47 cm^2 ------ Regurg PHT 281 ms ------ Tricuspid valve Regurg peak 351 cm/s ------ vel Peak RV-RA 49 mm ------ gradient, S Hg  CARDIAC CATH:  HEMODYNAMICS: Aortic pressure was 143/38 mmHg; LV pressure was 191/4 mmHg; LVEDP 13 mm mercury; right atrial mean 2 mmHg; right ventricular pressure 47/5 mmHg; pulmonary  artery pressure 44/16 mmHg; capillary wedge pressure mean 9 mm mercury; Fick cardiac output 4.3 L per minute; thermodilution cardiac output 3.6 L per minute; peak to peak aortic valve gradient 48 mmHg; aortic valve area 0.84 cm square (Fick) and 0.69 cm square (Thermo dilution).  ANGIOGRAPHIC DATA: The left main coronary artery is calcified but widely patent..  The left anterior descending artery is transapical. There is proximal calcification with 30-40% narrowing noted. There is 30-50% in-stent restenosis within the stented region proximally. Luminal irregularities are noted in the mid and distal LAD. The first diagonal is patent despite being jailed..  The left circumflex artery is dominant. Luminal irregularities are noted. 3 obtuse marginal branches and the PDA arises distally. Tortuosity is seen throughout the circumflex and its branches. No significant obstruction is noted..  The right coronary artery is nondominant/codominant and contains 50% ostial narrowing. No catheter damping noted. The mid to proximal RCA contains 30-40% narrowing to  LEFT VENTRICULOGRAM: Left ventricular angiogram was done in the 30 RAO projection and revealed normal left ventricular wall motion and systolic function with an estimated ejection fraction of 65 %.  IMPRESSIONS: 1. Calcific aortic stenosis, critical  2. Widely patent native coronary arteries  3. Moderate pulmonary hypertension  4. Heavily calcified/porcelain aorta along with heavy aortic valve and mitral annular calcification  5. Normal left ventricular systolic function with normal filling pressures of the time of cath  Gated Cardiac CTA:  Aortic Valve: Trileaflet Heavily calcified especially the left  coronary cusp  Sinus Measurements:  Right Coronary Sinus: 30 mm  Left Coronary Sinus: 29.7 mm  Non Coronary Sinus: 29.2 mm  LM height- 13.8 mm  RCA height- 15 mm  Aorta: Patient had a porcelain aorta. The calcification was  circumferential at the  sinotubular junction. There entire  ascending aorta and arch had "egg-shell" like calcification  STJ- 26.6 mm  Ascending Aorta- 30 mm  Annulus Measurements:  Max/Min diameter- 26 mm and 20 mm  Perimeter- 75.9 mm  Area- 450 mm2  Coronary Arteries Left dominant  LM- calcified but no significant stenosis  LAD- patent proximal stent  D1: patent but crossed by stent  Circumflex: non-significant calcified disease. 3 patent OM  branches without flow limiting stenosis  RCA - small and non dominant less than 50% proximal disease  LAA- No Left atrial appendage thrombus  Impression:  1) Sever calcific aortic stenosis. Tri-leaflet valve with heavy  calcification of the left cusp and mitral annular plane. Annular  measurements suggest she is a candidate for a 26 mm Sapien valve.  Possibly to under-inflate do to porcelain aort  2) Porcelain Ascending Aorta and Arch. Calcification is  circumferential and extends to the sinotubular junction  3) No critical CAD with patent LAD stent. Low ischemic potential  during rapid pacing. Coronary arteries sufficiently high in  sinuses  4) No Left Atrial Appendage Thrombus  CTA Abdomen/Pelvis:  CTA ABDOMEN AND PELVIS  VASCULAR MEASUREMENTS PERTINENT TO TAVR:  AORTA:  Minimal Aortic Diameter - 12.6 x 10.3 mm  Severity of Aortic Calcification - moderate to severe  RIGHT PELVIS:  Right Common Iliac Artery -  Minimal Diameter - 5.6 x 5.8 mm  Tortuosity - mild  Calcification - mild to moderate  Right External Iliac Artery -  Minimal Diameter - 8.2 x 7.3 mm  Tortuosity - moderate to severe  Calcification - mild  Right Common Femoral Artery -  Minimal Diameter - 6.1 x 7.6 mm  Tortuosity - mild  Calcification - mild  LEFT PELVIS:  Left Common Iliac Artery -  Minimal Diameter - 6.2 x 7.4 mm  Tortuosity - mild to moderate  Calcification - moderate  Left External Iliac Artery -  Minimal Diameter - 6.3 x 6.6 mm  Tortuosity - moderate to severe   Calcification - mild  Left Common Femoral Artery -  Minimal Diameter - 6.4 x 7.1 mm  Tortuosity - mild  Calcification - mild  Other Findings:  Abdomen/Pelvis: Status post cholecystectomy. 1.8 cm low  attenuation lesion in segment 7 of the liver is poorly  characterized on today's arterial phase examination, but is  unchanged in size compared to the prior study from 11/23/2007, and  most compatible with a small hepatic cyst. The appearance of the  pancreas, spleen, bilateral adrenal glands and bilateral kidneys is  unremarkable. Numerous colonic diverticula are noted, without  surrounding inflammatory changes to suggest acute diverticulitis at  this time. Trace volume of ascites in the anatomic pelvis. No  larger collection of ascites. No pneumoperitoneum. No pathologic  distension of small bowel. No definite pathologic lymphadenopathy  identified within the abdomen or pelvis. A small locule of gas non  dependently within the lumen of the urinary bladder. No Foley  balloon catheter in place at this time. Uterus and ovaries are  atrophic. A tiny umbilical hernia containing only omental fat  incidentally noted.  Musculoskeletal: There are no aggressive appearing lytic or blastic  lesions noted in the visualized portions of the skeleton. 7 mm of  anterolisthesis of L4 upon L5.  Review of the MIP images confirms the above findings.  IMPRESSION:  1. Vascular findings and measurements pertinent to potential TAVR  procedure, as detailed above. The patient does appear to have a  suitable left sided pelvic arterial access in terms of vessel size  (for low profile devices). However, there is extensive tortuosity  of the external iliac arteries bilaterally.  2. Trace volume of ascites. This may relate to passive hepatic  congestion from underlying congestive heart failure.  3. Small locule of gas noted nondependently within the lumen of  the urinary bladder. No Foley balloon catheter is  noted at this  time. This may be iatrogenic if the patient has recently been  catheterized for urinalysis. Alternatively, this could be  indicative of infection with gas forming organisms. Clinical  correlation is recommended.  4. Extensive colonic diverticulosis without findings to suggest  acute diverticulitis at this time.  5. Additional incidental findings, as above.  Pulmonary Function Tests  Baseline  FVC 1.37 L (80% predicted)  FEV1 1.37 L (112% predicted)  FEF25-75 2.65 L (352% predicted)  RV 2.29 L (100% predicted)  DLCO UNABLE TO OBTAIN  6 Minute Walk Test Results  Patient: Kaylee Schmitt  Date: 11/23/2012  Supplemental O2 during test? NO  Baseline End  Time 1500 1506  Heartrate 50 62  Dyspnea 2/10 4/10  Fatigue 2/10 4/10  O2 sat 98% 90%  Blood pressure 178/58 200/50  Patient ambulated at a slow pace for a total distance of 170 feet with 1 stops.  Ambulation was limited primarily due to oxygen sats dropping to 86% but quickly came back up.  Overall the test was tolerated fair.   STS RISK CALCULATOR:  Procedure Name Isolated AVRepl Risk of Mortality 7.621% Morbidity or Mortality 32.533%  Long Length of Stay 17.123%  Short Length of Stay 13.586%  Permanent Stroke 4.702%  Prolonged Ventilation 25.297%  DSW Infection 0.118%  Renal Failure 8.691%  Reoperation 12.592%   ASSESSMENT AND PLAN:  79 year old woman with severe symptomatic aortic stenosis and class IV heart failure. She is not a surgical candidate because of a porcelain aorta. She has been evaluated by members of the multidisciplinary heart valve team, including 2 cardiac surgeons. Transcatheter aortic valve replacement is a reasonable treatment alternatives to conventional aortic valve surgery. We have discussed all options, including palliative medical therapy. The patient and her daughter both wish to pursue treatment. I have reviewed the risks of transcatheter aortic valve replacement, including vascular  injury, bleeding, infection, perivalvular regurgitation, valve embolization, stroke, myocardial infarction, cardiac perforation, cardiac tamponade, annular rupture, aortic dissection, and death. The patient understands these risks and agrees to proceed.  Multiple imaging studies including cardiac and vascular CTs, catheterization films and hemodynamic data, and echo images, have all been carefully reviewed. She appears to have suitable vascular anatomic findings for left femoral artery access. Her valve annulus size is borderline between a 23 mm and 26 mm valve. However, considering her porcelain aorta and heavy calcification at the sinotubular junction, a 23 mm valve may be most prudent.   If femoral access is not successful, she would be a candidate for trans- apical access. The patient would not be a candidate for a sternotomy in the case of catastrophic complication. This was all reviewed in detail with the patient and her daughter who understand.   Tonny Bollman 11/27/2012 7:59 AM

## 2012-11-27 NOTE — Progress Notes (Signed)
Utilization Review Completed.Daritza Brees T8/19/2014  

## 2012-11-27 NOTE — OR Nursing (Addendum)
6 French Sheath removed from Right Femoral Artery and 6 French Sheath removed from Right Femoral Vein and arterial pressure held for 20 minutes and venous pressure held for 5 minutes by [employer].

## 2012-11-27 NOTE — OR Nursing (Signed)
Call made to 2300 at 1038; spoke with [facility].

## 2012-11-27 NOTE — OR Nursing (Deleted)
6 French Sheath removed from Right Femoral Artery and 6 French sheath removed from Right Femoral Vein by Ronald Lobo [facility]

## 2012-11-27 NOTE — Op Note (Signed)
TAVR OPERATIVE REPORT  Date of Procedure: 11/27/2012   Preoperative Diagnosis: Severe Aortic Stenosis   Postoperative Diagnosis: Same   Procedure:  Transcatheter Aortic Valve Replacement - Transfemoral Approach Edwards Sapien THV (size 23 mm, model # 9300TFX, serial # [id number])   Co-Surgeons: Salvatore Decent. Cornelius Moras, MD and Tonny Bollman, MD   Assistants: Alleen Borne, MD and Earney Hamburg, MD  Anesthesiologist: Dr Noreene Larsson  Echocardiographer: Dr Eden Emms   Pre-operative Echo Findings:  Severe aortic stenosis  Severe LVH, normal LV systolic function  Mild AI, Mild MR  Post-operative Echo Findings:  Mild paravalvular leak   BRIEF CLINICAL NOTE AND INDICATIONS FOR SURGERY  During the course of the patient's preoperative work up they have been evaluated comprehensively by a multidisciplinary team of specialists coordinated through the Multidisciplinary Heart Valve Clinic in the [facility]. They have been demonstrated to suffer from symptomatic severe aortic stenosis as noted above. The patient has been counseled extensively as to the relative risks and benefits of all options for the treatment of severe aortic stenosis including long term medical therapy, conventional surgery for aortic valve replacement, and transcatheter aortic valve replacement. The patient has been independently evaluated by two cardiac surgeons including Dr Cornelius Moras and Dr. 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 Both surgeons indicated the patient would be a poor candidate for conventional surgery (predicted risk of mortality >15% and/or predicted risk of permanent morbidity >50%) because of comorbidities including porcelain aorta, advanced age. Based upon review of all of the patient's  preoperative diagnostic tests they are felt to be candidate for transcatheter aortic valve replacement using the transfemoral approach as an alternative to high risk conventional surgery.  Following the decision to proceed with transcatheter aortic valve replacement, a discussion has been held regarding what types of management strategies would be attempted intraoperatively in the event of life-threatening complications, including whether or not the patient would be considered a candidate for the use of cardiopulmonary bypass and/or conversion to open sternotomy for attempted surgical intervention. The patient has been advised of a variety of complications that might develop peculiar to this approach including but not limited to risks of death, stroke, paravalvular leak, aortic dissection or other major vascular complications, aortic annulus rupture, device embolization, cardiac rupture or perforation, acute myocardial infarction, arrhythmia, heart block or bradycardia requiring permanent pacemaker placement, congestive heart failure, respiratory failure, renal failure, pneumonia, infection, other late complications related to structural valve deterioration or migration, or other complications that might ultimately cause a temporary or permanent loss of functional independence or other long term morbidity. The patient provides full informed consent for the procedure as described and all questions were answered preoperatively.   DETAILS OF THE OPERATIVE PROCEDURE  PREPARATION:  The patient is brought to the operating room on the above mentioned date and central monitoring was established by the anesthesia team including placement of Swan-Ganz catheter and radial arterial line. The patient is placed in the supine position on the operating table. Intravenous antibiotics are administered. General endotracheal anesthesia is induced uneventfully. A Foley catheter is placed.  Baseline transesophageal echocardiogram  was performed. The patient's chest, abdomen, both groins, and both lower extremities are prepared and draped in a sterile manner. A time out procedure is performed.   PERIPHERAL ACCESS:  Using  the modified Seldinger technique, femoral arterial and venous access was obtained with placement of 6 Fr sheaths on the right side. A pigtail diagnostic catheter was passed through the 6 Fr right femoral arterial sheath under fluoroscopic guidance into the aortic root. A temporary transvenous pacemaker catheter was passed through the right femoral venous sheath under fluoroscopic guidance into the right ventricle. The pacemaker was tested to ensure stable lead placement and pacemaker capture. Aortic root angiography was performed in order to determine the optimal angiographic angle for valve deployment.   TRANSFEMORAL ACCESS:  A left femoral arterial cutdown was performed by Dr Cornelius Moras. Please see his separate operative note for details. The patient was heparinized systemically and ACT verified > 250 seconds. A 16 Fr transfemoral sheath was introduced into the left femoral artery after progressively dilating over an Amplatz superstiff wire. An Al-2 catheter was used to direct a straight-tip exchange length wire across the native aortic valve into the left ventricle. I tried to pass a pigtail catheter over the straight tip wire but it would not cross the valve. The AL-2 catheter was used to advance the wire back into the LV apex and then was used to change out to an Amplatz Extra-stiff wire. At that point, BAV was performed using a 20 mm valvuloplasty balloon. The balloon was difficult to advance across the valve. Once optimal position was achieved, BAV was done under rapid ventricular pacing at 180 bpm. The patient recovered well.   TRANSCATHETER HEART VALVE DEPLOYMENT:  An Edwards Sapien XT THV (size 23 mm) was prepared and crimped per manufacturer's guidelines, and the proper orientation of the valve is confirmed on  the [employer] delivery system. The valve was advanced through the introducer sheath using normal technique until in an appropriate position in the abdominal aorta beyond the sheath tip. The balloon was then retracted and using the fine-tuning wheel was centered on the valve. The valve was then advanced across the aortic arch using appropriate flexion of the catheter. The valve was carefully positioned across the aortic valve annulus. Once final position of the valve has been confirmed, the valve is deployed while temporarily holding ventilation and during rapid ventricular pacing to maintain systolic blood pressure < 50 mmHg and pulse pressure < 10 mmHg. The balloon inflation is held for >3 seconds after reaching full deployment volume. Once the balloon has fully deflated the balloon is retracted into the ascending aorta and valve function is assessed using TEE. There is felt to be mild-moderate paravalvular leak and no central aortic insufficiency. One extra cc of fluid was introduced into the balloon and a second inflation was performed. The amount of AI after post-dilatation was improved to mild. The patient's hemodynamic recovery following valve deployment is good.  The deployment balloon and guidewire are both removed. Echo demostrated acceptable post-procedural gradients, stable mitral valve function, and only mild AI. Ventriculagraphy confirmed no greater than mild AI.  PROCEDURE COMPLETION:  The sheath was pulled back into the ipsilateral external iliac artery and an abdominal aortogram was performed. There was no angiographic evidence of vascular injury. The sheath was removed and the femoral artery repaired by Dr Cornelius Moras. Please see his separate operative report for details. Protamine was administered once femoral arterial repair was complete. The temporary pacemaker, pigtail catheters and contralateral femoral sheaths were removed with manual pressure used for hemostasis.  The patient  tolerated the procedure well and is transported to the surgical intensive care in stable condition. There were no immediate intraoperative complications.  All sponge instrument and needle counts are verified correct at completion of the operation.   Tonny Bollman 11/27/2012 1:10 PM

## 2012-11-27 NOTE — Progress Notes (Signed)
  Echocardiogram Echocardiogram Transesophageal has been performed.  Kaylee Schmitt 11/27/2012, 10:46 AM

## 2012-11-27 NOTE — Anesthesia Postprocedure Evaluation (Signed)
  Anesthesia Post-op Note  Patient: Kaylee Schmitt  Procedure(s) Performed: Procedure(s): TRANSCATHETER AORTIC VALVE REPLACEMENT, TRANSFEMORAL (N/A) INTRAOPERATIVE TRANSESOPHAGEAL ECHOCARDIOGRAM (N/A)  Patient Location: SICU  Anesthesia Type:General  Level of Consciousness: sedated and Patient remains intubated per anesthesia plan  Airway and Oxygen Therapy: Patient remains intubated per anesthesia plan and Patient placed on Ventilator (see vital sign flow sheet for setting)  Post-op Pain: none  Post-op Assessment: Post-op Vital signs reviewed and Patient's Cardiovascular Status Stable  Post-op Vital Signs: stable  Complications: No apparent anesthesia complications

## 2012-11-27 NOTE — OR Nursing (Signed)
Second call made to 2300; spoke with Christine RN. 

## 2012-11-27 NOTE — Anesthesia Preprocedure Evaluation (Addendum)
Anesthesia Evaluation  Patient identified by MRN, date of birth, ID band Patient awake    Reviewed: Allergy & Precautions, H&P , NPO status , Patient's Chart, lab work & pertinent test results, reviewed documented beta blocker date and time   Airway Mallampati: II TM Distance: <3 FB Neck ROM: Full    Dental  (+) Edentulous Upper, Edentulous Lower and Dental Advisory Given   Pulmonary shortness of breath,  breath sounds clear to auscultation        Cardiovascular hypertension, Pt. on home beta blockers + CAD and +CHF Rhythm:Regular Rate:Normal + Systolic murmurs    Neuro/Psych CVA, Residual Symptoms    GI/Hepatic   Endo/Other    Renal/GU Renal disease     Musculoskeletal   Abdominal   Peds  Hematology   Anesthesia Other Findings   Reproductive/Obstetrics                         Anesthesia Physical Anesthesia Plan  ASA: III  Anesthesia Plan: General   Post-op Pain Management:    Induction: Intravenous  Airway Management Planned: Oral ETT  Additional Equipment: Arterial line, PA Cath and CVP  Intra-op Plan:   Post-operative Plan: Post-operative intubation/ventilation  Informed Consent: I have reviewed the patients History and Physical, chart, labs and discussed the procedure including the risks, benefits and alternatives for the proposed anesthesia with the patient or authorized representative who has indicated his/her understanding and acceptance.     Plan Discussed with: CRNA and Anesthesiologist  Anesthesia Plan Comments: (Critical symptomatic calcific aortic stenosis, AVA 0.53 with mean gradient 38 by last Echo Porcelain aorta CAD S/P bare metal stent to LAD 2011 no coronary obstruction by cath 10/19/12 CVA 2003 with L. Hemiparesis Htn    Kaylee Schmitt   )    Anesthesia Quick Evaluation

## 2012-11-27 NOTE — OR Nursing (Signed)
Rectal Probe inserted prior to patient prep by Nancy Manuele RN. 

## 2012-11-27 NOTE — Progress Notes (Signed)
T. CTS p.m. Rounds  Doing well after T. aVR earlier today Stable hemodynamics extubated Left groin incision without hematoma Doing well

## 2012-11-27 NOTE — Procedures (Signed)
Extubation Procedure Note  Patient Details:   Name: Kaylee Schmitt DOB: [birth year] MRN: [id number]   Airway Documentation:     Evaluation  O2 sats: stable throughout Complications: No apparent complications Patient did tolerate procedure well. Bilateral Breath Sounds: Clear   Yes  Pt tolerated wean well, -20 NIF, VC and positive for cuff leak. Pt extubated to 4lpm [place]. No dyspnea or stridor noted after extubation. Pt appears much more comfortable at this time. RT will continue to monitor.   Beatris Si 11/27/2012, 3:38 PM

## 2012-11-27 NOTE — Preoperative (Signed)
Beta Blockers   Reason not to administer Beta Blockers:Not Applicable 

## 2012-11-27 NOTE — Anesthesia Procedure Notes (Signed)
Procedure Name: Intubation Date/Time: 11/27/2012 8:24 AM Performed by: Jefm Miles E Pre-anesthesia Checklist: Patient identified, Emergency Drugs available, Suction available, Patient being monitored and Timeout performed Patient Re-evaluated:Patient Re-evaluated prior to inductionOxygen Delivery Method: Circle system utilized Preoxygenation: Pre-oxygenation with 100% oxygen Intubation Type: IV induction Ventilation: Mask ventilation without difficulty and Oral airway inserted - appropriate to patient size Laryngoscope Size: Mac and 3 Grade View: Grade I Tube type: Oral Tube size: 8.0 mm Number of attempts: 1 Airway Equipment and Method: Stylet Placement Confirmation: ETT inserted through vocal cords under direct vision,  positive ETCO2 and breath sounds checked- equal and bilateral Secured at: 21 cm Tube secured with: Tape Dental Injury: Teeth and Oropharynx as per pre-operative assessment

## 2012-11-27 NOTE — Op Note (Addendum)
CARDIOTHORACIC SURGERY OPERATIVE NOTE  Date of Procedure:  11/27/2012  Preoperative Diagnosis: Severe Aortic Stenosis   Postoperative Diagnosis: Same   Procedure:    Transcatheter Aortic Valve Replacement - Open Left Transfemoral Approach  Edwards Sapien XT (size 23 mm, model # 9300TFX, serial # [id number])   Co-Surgeons:  Salvatore Decent. Cornelius Moras, MD and Tonny Bollman, MD  Assistants:   Alleen Borne, MD and Verne Carrow, MD  Anesthesiologist:  Kipp Brood, MD  Echocardiographer:  Charlton Haws, MD  Pre-operative Echo Findings:  Severe aortic stenosis  Normal left ventricular systolic function   Post-operative Echo Findings:  Mild (1+) paravalvular leak  Normal left ventricular systolic function      DETAILS OF THE OPERATIVE PROCEDURE  The majority of the procedure is documented separately in a procedure note by Dr. Excell Seltzer.   TRANSFEMORAL ACCESS:   The location of the bifurcation of the left common femoral artery is identified fluoroscopically with an angiogram shot from the distal aorta just above the aortic bifurcation.  A small vertical incision is made in the left groin immediately over the common femoral artery. The subcutaneous tissues are divided with electrocautery and the anterior surface of the common femoral artery is identified. Sharp dissection is utilized to free up the artery proximally and distally and the vessel is encircled with a pair vessel loops.  The patient is heparinized systemically and ACT verified > 250 seconds.  A pair of purse-string Gore-tex CV-4 Gore-tex sutures are placed in the anterior surface of the femoral artery.  The common femoral artery is punctured using an 18 gauge needle and a soft J-tipped guidewire is passed into the common iliac artery under fluoroscopic guidance.  A 6 Fr sheath is placed over the guidewire and the guidewire is removed.  An Amplatz super stiff guidewire is passed through the sheath into the descending  thoracic aorta and the introducing sheath is removed.  Serial dilators are passed over the guidewire under continuous fluoroscopic guidance, making certain that each dilator passes easily all of the way into the distal abdominal aorta.  A 16 Fr Edwards introducer sheath is passed over the guidewire into the abdominal aorta.  The introducing dilator is removed, the sheath is flushed with heparinized saline, and the sheath is secured to the skin.    FEMORAL SHEATH REMOVAL AND ARTERIAL CLOSURE:  After the completion of successful valve deployment as documented separately by Dr. Excell Seltzer, the femoral artery sheath is pulled down under fluoroscopic guidance until the distal tip is within the external iliac artery. An arteriogram is obtained from the aortic bifurcation to visualize the vascular integrity of the common iliac artery and its bifurcation.  The femoral artery sheath is subsequently removed and atraumatic DeBakey vascular clamps applied for proximal and distal control. The arteriotomy is inspected for integrity of the endothelial surface and all damaged tissue is sharply debrided. The arteriotomy is closed using the previously placed Gore-tex sutures.   Once the repair has been completed protamine was administered to reverse the anticoagulation. One final digitally-subtracted arteriogram is obtained from just above the aortic bifurcation to confirm the integrity of the vascular repair without any significant associated compromise of the femoral artery lumen.     Salvatore Decent. Cornelius Moras MD 11/27/2012 11:18 AM

## 2012-11-27 NOTE — Transfer of Care (Signed)
Immediate Anesthesia Transfer of Care Note  Patient: Kaylee Schmitt  Procedure(s) Performed: Procedure(s): TRANSCATHETER AORTIC VALVE REPLACEMENT, TRANSFEMORAL (N/A) INTRAOPERATIVE TRANSESOPHAGEAL ECHOCARDIOGRAM (N/A)  Patient Location: SICU  Anesthesia Type:General  Level of Consciousness: Patient remains intubated per anesthesia plan  Airway & Oxygen Therapy: Patient remains intubated per anesthesia plan and Patient placed on Ventilator (see vital sign flow sheet for setting)  Post-op Assessment: Report given to PACU RN  Post vital signs: Reviewed and stable  Complications: No apparent anesthesia complications

## 2012-11-28 ENCOUNTER — Other Ambulatory Visit: Payer: Self-pay | Admitting: *Deleted

## 2012-11-28 ENCOUNTER — Inpatient Hospital Stay (HOSPITAL_COMMUNITY): Payer: Medicare Other

## 2012-11-28 DIAGNOSIS — I359 Nonrheumatic aortic valve disorder, unspecified: Secondary | ICD-10-CM

## 2012-11-28 LAB — GLUCOSE, CAPILLARY
Glucose-Capillary: 103 mg/dL — ABNORMAL HIGH (ref 70–99)
Glucose-Capillary: 102 mg/dL — ABNORMAL HIGH (ref 70–99)
Glucose-Capillary: 115 mg/dL — ABNORMAL HIGH (ref 70–99)
Glucose-Capillary: 122 mg/dL — ABNORMAL HIGH (ref 70–99)

## 2012-11-28 LAB — BASIC METABOLIC PANEL
CO2: 23 mEq/L (ref 19–32)
Calcium: 8.8 mg/dL (ref 8.4–10.5)
Creatinine, Ser: 1.07 mg/dL (ref 0.50–1.10)
GFR calc non Af Amer: 45 mL/min — ABNORMAL LOW (ref >90)
Glucose, Bld: 99 mg/dL (ref 70–99)

## 2012-11-28 LAB — POCT I-STAT, CHEM 8
Chloride: 103 mEq/L (ref 96–112)
HCT: 33 % — ABNORMAL LOW (ref 36.0–46.0)
Hemoglobin: 11.2 g/dL — ABNORMAL LOW (ref 12.0–15.0)
Potassium: 3.3 mEq/L — ABNORMAL LOW (ref 3.5–5.1)
Sodium: 142 mEq/L (ref 135–145)

## 2012-11-28 LAB — CBC
HCT: 32.4 % — ABNORMAL LOW (ref 36.0–46.0)
MCH: 29.2 pg (ref 26.0–34.0)
MCH: 29.1 pg (ref 26.0–34.0)
MCHC: 34 g/dL (ref 30.0–36.0)
MCHC: 33.8 g/dL (ref 30.0–36.0)
Platelets: 102 10*3/uL — ABNORMAL LOW (ref 150–400)
RBC: 3.82 MIL/uL — ABNORMAL LOW (ref 3.87–5.11)
WBC: 10.7 10*3/uL — ABNORMAL HIGH (ref 4.0–10.5)

## 2012-11-28 LAB — CREATININE, SERUM: Creatinine, Ser: 1.01 mg/dL (ref 0.50–1.10)

## 2012-11-28 MED ORDER — ATENOLOL 25 MG PO TABS
25.0000 mg | ORAL_TABLET | Freq: Every morning | ORAL | Status: DC
  Administered 2012-11-28 – 2012-12-03 (×6): 25 mg via ORAL
  Filled 2012-11-28 (×6): qty 1

## 2012-11-28 MED ORDER — POTASSIUM CHLORIDE 10 MEQ/50ML IV SOLN
10.0000 meq | INTRAVENOUS | Status: AC
  Administered 2012-11-28 (×4): 10 meq via INTRAVENOUS

## 2012-11-28 MED ORDER — HYDRALAZINE HCL 20 MG/ML IJ SOLN
INTRAMUSCULAR | Status: AC
  Filled 2012-11-28: qty 1

## 2012-11-28 MED ORDER — HYDRALAZINE HCL 20 MG/ML IJ SOLN
10.0000 mg | Freq: Once | INTRAMUSCULAR | Status: AC
  Administered 2012-11-28: 10 mg via INTRAVENOUS

## 2012-11-28 MED ORDER — HYDRALAZINE HCL 25 MG PO TABS
25.0000 mg | ORAL_TABLET | Freq: Three times a day (TID) | ORAL | Status: DC
  Administered 2012-11-28 – 2012-12-01 (×8): 25 mg via ORAL
  Filled 2012-11-28 (×11): qty 1

## 2012-11-28 MED ORDER — ISOSORBIDE MONONITRATE ER 60 MG PO TB24
60.0000 mg | ORAL_TABLET | Freq: Once | ORAL | Status: AC
  Administered 2012-11-28: 60 mg via ORAL
  Filled 2012-11-28: qty 1

## 2012-11-28 MED ORDER — ISOSORBIDE MONONITRATE ER 60 MG PO TB24
60.0000 mg | ORAL_TABLET | Freq: Every day | ORAL | Status: DC
  Administered 2012-11-29 – 2012-12-03 (×5): 60 mg via ORAL
  Filled 2012-11-28 (×5): qty 1

## 2012-11-28 MED ORDER — AMLODIPINE BESYLATE 5 MG PO TABS
5.0000 mg | ORAL_TABLET | Freq: Once | ORAL | Status: AC
  Administered 2012-11-28: 5 mg via ORAL
  Filled 2012-11-28: qty 1

## 2012-11-28 MED ORDER — ASPIRIN 81 MG PO CHEW
81.0000 mg | CHEWABLE_TABLET | Freq: Every day | ORAL | Status: DC
  Administered 2012-11-29 – 2012-12-03 (×5): 81 mg via ORAL
  Filled 2012-11-28 (×5): qty 1

## 2012-11-28 MED ORDER — LABETALOL HCL 5 MG/ML IV SOLN
INTRAVENOUS | Status: AC
  Administered 2012-11-28: 20 mg via INTRAVENOUS
  Filled 2012-11-28: qty 4

## 2012-11-28 MED ORDER — HYDRALAZINE HCL 20 MG/ML IJ SOLN
10.0000 mg | INTRAMUSCULAR | Status: DC | PRN
  Administered 2012-11-29: 10 mg via INTRAVENOUS
  Filled 2012-11-28: qty 1

## 2012-11-28 MED ORDER — FUROSEMIDE 40 MG PO TABS
40.0000 mg | ORAL_TABLET | Freq: Two times a day (BID) | ORAL | Status: DC
  Administered 2012-11-28 (×2): 40 mg via ORAL
  Filled 2012-11-28 (×4): qty 1

## 2012-11-28 MED ORDER — AMLODIPINE BESYLATE 5 MG PO TABS
5.0000 mg | ORAL_TABLET | Freq: Every evening | ORAL | Status: DC
  Administered 2012-11-28: 5 mg via ORAL
  Filled 2012-11-28 (×2): qty 1

## 2012-11-28 MED ORDER — POTASSIUM CHLORIDE 10 MEQ/50ML IV SOLN
INTRAVENOUS | Status: AC
  Filled 2012-11-28: qty 200

## 2012-11-28 MED ORDER — IRBESARTAN 150 MG PO TABS
150.0000 mg | ORAL_TABLET | Freq: Every day | ORAL | Status: DC
  Administered 2012-11-28 – 2012-12-03 (×6): 150 mg via ORAL
  Filled 2012-11-28 (×6): qty 1

## 2012-11-28 MED ORDER — LABETALOL HCL 5 MG/ML IV SOLN: 20.0000 mg | Freq: Once | INTRAVENOUS | Status: AC

## 2012-11-28 MED FILL — Potassium Chloride Inj 2 mEq/ML: INTRAVENOUS | Qty: 40 | Status: AC

## 2012-11-28 MED FILL — Magnesium Sulfate Inj 50%: INTRAMUSCULAR | Qty: 10 | Status: AC

## 2012-11-28 MED FILL — Sodium Chloride IV Soln 0.9%: INTRAVENOUS | Qty: 1000 | Status: AC

## 2012-11-28 MED FILL — Heparin Sodium (Porcine) Inj 1000 Unit/ML: INTRAMUSCULAR | Qty: 30 | Status: AC

## 2012-11-28 NOTE — Progress Notes (Signed)
    Subjective:  Feels good. No chest pain or dyspnea. Didn't sleep well last night.  Objective:  Vital Signs in the last 24 hours: Temp:  [95.9 F (35.5 C)-98.6 F (37 C)] 98.3 F (36.8 C) (08/20 0745) Pulse Rate:  [45-71] 70 (08/20 0800) Resp:  [12-29] 25 (08/20 0800) BP: (102-198)/(31-112) 174/63 mmHg (08/20 0800) SpO2:  [91 %-100 %] 92 % (08/20 0800) Arterial Line BP: (130-223)/(35-69) 192/64 mmHg (08/20 0000) FiO2 (%):  [40 %-50 %] 40 % (08/19 1500) Weight:  [130 lb 1.1 oz (59 kg)-141 lb 1.5 oz (64 kg)] 141 lb 1.5 oz (64 kg) (08/20 0600)  Intake/Output from previous day: 08/19 0701 - 08/20 0700 In: 6787.4 [I.V.:5127.4; Blood:700; NG/GT:30; IV Piggyback:450] Out: 1245 [Urine:1085; Emesis/NG output:60; Blood:100]  Physical Exam: Pt is alert and oriented, pleasant elderly woman in NAD HEENT: normal Neck: JVP - normal Lungs: CTA bilaterally CV: RRR with grade 2/6 soft ejection murmur at the left sternal border, no diastolic murmur. Abd: soft, NT, Positive BS, no hepatomegaly Ext: no C/C/E, distal pulses intact and equal, right groin clear, left groin surgical incision clear Skin: warm/dry no rash   Lab Results:  Recent Labs  11/27/12 1730 11/28/12 0430  WBC 9.3 10.7*  HGB 11.3* 11.0*  PLT 101* 101*    Recent Labs  11/27/12 1728 11/27/12 1730 11/28/12 0430  NA 139  --  139  K 4.4  --  3.6  CL 109  --  106  CO2  --   --  23  GLUCOSE 106*  --  99  BUN 24*  --  21  CREATININE 1.20* 1.08 1.07   No results found for this basename: TROPONINI, CK, MB,  in the last 72 hours  Cardiac Studies: 2-D echocardiogram pending  Tele: Personally reviewed, sinus rhythm.  Assessment/Plan:  1. Severe aortic stenosis status post transcatheter aortic valve replacement. Patient is progressing very well postoperative day 1. Will DC lines and Foley catheter. Transfer to step down bed. Start Plavix 75 mg daily.  2. Hypertension. Blood pressure is elevated and patient is  currently on a nitroglycerin drip for blood pressure control. Will resume her oral antihypertensive medicines today and wean off of IV nitroglycerin.  3. Postoperative volume overload. Will give IV Lasix today and probably resume oral furosemide tomorrow. Will need to replete potassium.  Overall she looks great. Will ask the case manager to evaluate her for disposition and further discussion with family. Plans for her either to return home with her daughter or go to short-term skilled nursing for rehabilitation. PT/OT assessment pending.   Tonny Bollman, M.D. 11/28/2012, 9:39 AM

## 2012-11-28 NOTE — Evaluation (Signed)
Physical Therapy Evaluation Patient Details Name: Kaylee Schmitt MRN: [id number] DOB: April 06, 1924 Today's Date: 11/28/2012 Time: 7829-5621 PT Time Calculation (min): 15 min  PT Assessment / Plan / Recommendation History of Present Illness  s/p TAVR  Clinical Impression  Limited PT assessment at this time secondary to elevated BP, Patient demonstrates deficits in functional mobility, activity tolerance, and strength. Anticipate pt will benefit from continued skilled PT to address deficits and maximize function.  Will continue to see as indicated and progress activity as tolerated. Will evaluate further for discharge planning.    PT Assessment  Patient needs continued PT services    Follow Up Recommendations  Other (comment) (TBD)          Equipment Recommendations   (TBD)    Recommendations for Other Services     Frequency Min 3X/week    Precautions / Restrictions     Pertinent Vitals/Pain Patient reports no pain, Vitals assessed BP elevated significantly, nsg aware (200s/100s)      Mobility  Bed Mobility Bed Mobility: Rolling Right;Rolling Left Rolling Right: 4: Min assist Rolling Left: 4: Min assist Transfers Transfers: Not assessed Ambulation/Gait Ambulation/Gait Assistance: Not tested (comment)    Exercises General Exercises - Lower Extremity Short Arc Quad: AROM;Both;5 reps Straight Leg Raises: AROM;Both;5 reps   PT Diagnosis: Difficulty walking;Generalized weakness;Acute pain  PT Problem List: Decreased strength;Decreased activity tolerance;Decreased mobility PT Treatment Interventions: DME instruction;Gait training;Stair training;Functional mobility training;Therapeutic activities;Therapeutic exercise;Balance training;Patient/family education     PT Goals(Current goals can be found in the care plan section) Acute Rehab PT Goals Patient Stated Goal: to get some sleep PT Goal Formulation: With patient Time For Goal Achievement: 12/12/12 Potential to Achieve  Goals: Fair  Visit Information  Last PT Received On: 11/28/12 Assistance Needed: +2 History of Present Illness: s/p TAVR       Prior Functioning  Home Living Family/patient expects to be discharged to:: Private residence Living Arrangements: Children (lives with daughter) Available Help at Discharge: Family Type of Home: House Home Access: Stairs to enter Secretary/administrator of Steps: 6 Entrance Stairs-Rails: Right;Left Home Layout: One level Home Equipment: Environmental consultant - 2 wheels;Shower seat Prior Function Level of Independence: Independent with assistive device(s) Comments: uses walker Dominant Hand: Right    Cognition  Cognition Arousal/Alertness: Awake/alert Behavior During Therapy: Anxious Overall Cognitive Status: No family/caregiver present to determine baseline cognitive functioning    Extremity/Trunk Assessment Upper Extremity Assessment Upper Extremity Assessment: Defer to OT evaluation Lower Extremity Assessment Lower Extremity Assessment: Generalized weakness   Balance    End of Session PT - End of Session Activity Tolerance: Treatment limited secondary to medical complications (Comment) Patient left: in bed;with call bell/phone within reach;with nursing/sitter in room  GP     Fabio Asa 11/28/2012, 3:52 PM Charlotte Crumb, PT DPT  [phone]

## 2012-11-28 NOTE — Progress Notes (Addendum)
      [address]       Kaylee Schmitt [address]             [phone]        CARDIOTHORACIC SURGERY PROGRESS NOTE   R1 Day Post-Op Procedure(s) (LRB): TRANSCATHETER AORTIC VALVE REPLACEMENT, TRANSFEMORAL (N/A) INTRAOPERATIVE TRANSESOPHAGEAL ECHOCARDIOGRAM (N/A)  Subjective: Looks great.  No pain.  Denies SOB.  Feels well.  Complaining that IV pump was beeping all night and kept her from sleeping.  Objective: Vital signs: BP Readings from Last 1 Encounters:  11/28/12 174/63   Pulse Readings from Last 1 Encounters:  11/28/12 70   Resp Readings from Last 1 Encounters:  11/28/12 25   Temp Readings from Last 1 Encounters:  11/28/12 98.3 F (36.8 C) Oral    Hemodynamics: PAP: (37-60)/(14-31) 52/17 mmHg CO:  [3.1 L/min-4 L/min] 4 L/min CI:  [2 L/min/m2-2.6 L/min/m2] 2.6 L/min/m2  Physical Exam:  Rhythm:   Sinus w/ PAC's  Breath sounds: Few rhonchi  Heart sounds:  RRR soft systolic murmur, no diastolic murmur  Incisions:  Clean and dry  Abdomen:  soft  Extremities:  warm   Intake/Output from previous day: 08/19 0701 - 08/20 0700 In: 6787.4 [I.V.:5127.4; Blood:700; NG/GT:30; IV Piggyback:450] Out: 1245 [Urine:1085; Emesis/NG output:60; Blood:100] Intake/Output this shift: Total I/O In: 264 [I.V.:64; IV Piggyback:200] Out: 60 [Urine:60]  Lab Results:  Recent Labs  11/27/12 1730 11/28/12 0430  WBC 9.3 10.7*  HGB 11.3* 11.0*  HCT 33.3* 32.4*  PLT 101* 101*   BMET:  Recent Labs  11/27/12 1728 11/27/12 1730 11/28/12 0430  NA 139  --  139  K 4.4  --  3.6  CL 109  --  106  CO2  --   --  23  GLUCOSE 106*  --  99  BUN 24*  --  21  CREATININE 1.20* 1.08 1.07  CALCIUM  --   --  8.8    CBG (last 3)   Recent Labs  11/27/12 2339 11/28/12 0415 11/28/12 0743  GLUCAP 77 103* 102*   ABG    Component Value Date/Time   PHART 7.356 11/27/2012 1642   HCO3 23.4 11/27/2012 1642   TCO2 21 11/27/2012 1728   ACIDBASEDEF 2.0 11/27/2012 1642   O2SAT 94.0 11/27/2012 1642   CXR: CLINICAL DATA: Postop valve replacement.  EXAM:  PORTABLE CHEST - 1 VIEW  COMPARISON: 11/27/2012  FINDINGS:  Interval removal of Swan-Ganz catheter, endotracheal tube and NG  tube. Right central line remains in place, unchanged. Postoperative  changes. Dense calcifications within the descending aorta and aortic  arch. Mild cardiomegaly. Vascular congestion and diffuse  interstitial prominence throughout the lungs, stable patchy airspace  disease in the left mid and lower lung  IMPRESSION:  Slight increase interstitial prominence upper lungs may reflect  interstitial edema. Patchy opacity in the left mid and lower lung.  Electronically Signed  By: Charlett Nose  On: 11/28/2012 07:50   Assessment/Plan: S/P Procedure(s) (LRB): TRANSCATHETER AORTIC VALVE REPLACEMENT, TRANSFEMORAL (N/A) INTRAOPERATIVE TRANSESOPHAGEAL ECHOCARDIOGRAM (N/A)  Doing remarkably well POD1 Hypertensive on IV NTG Otherwise looks ready for transfer Plan per Dr Boneta Lucks H 11/28/2012 8:22 AM

## 2012-11-28 NOTE — Progress Notes (Signed)
  Echocardiogram 2D Echocardiogram has been performed.  Kaylee Schmitt 11/28/2012, 12:12 PM

## 2012-11-28 NOTE — Progress Notes (Signed)
Unable to wean down IV nitroglycerin d/t SBP ~200 after oral anti-hypertensive meds given.  MD Excell Seltzer made aware.  Orders received.  Will continue to monitor.

## 2012-11-28 NOTE — Progress Notes (Addendum)
      [address]       [name] [address]             [phone]      1 Day Post-Op Procedure(s) (LRB): TRANSCATHETER AORTIC VALVE REPLACEMENT, TRANSFEMORAL (N/A) INTRAOPERATIVE TRANSESOPHAGEAL ECHOCARDIOGRAM (N/A)  Subjective:  Kaylee Schmitt is without complaints this morning.  She continues to pull at her neck line.    Objective: Vital signs in last 24 hours: Temp:  [95.9 F (35.5 C)-98.6 F (37 C)] 98.3 F (36.8 C) (08/20 0745) Pulse Rate:  [45-71] 66 (08/20 0700) Cardiac Rhythm:  [-] Sinus bradycardia;Heart block (08/20 0400) Resp:  [12-29] 19 (08/20 0700) BP: (102-198)/(31-112) 181/66 mmHg (08/20 0700) SpO2:  [91 %-100 %] 99 % (08/20 0700) Arterial Line BP: (130-223)/(35-69) 192/64 mmHg (08/20 0000) FiO2 (%):  [40 %-50 %] 40 % (08/19 1500) Weight:  [130 lb 1.1 oz (59 kg)-141 lb 1.5 oz (64 kg)] 141 lb 1.5 oz (64 kg) (08/20 0600)  Hemodynamic parameters for last 24 hours: PAP: (37-60)/(14-31) 52/17 mmHg CO:  [3.1 L/min-4 L/min] 4 L/min CI:  [2 L/min/m2-2.6 L/min/m2] 2.6 L/min/m2  Intake/Output from previous day: 08/19 0701 - 08/20 0700 In: 6652.4 [I.V.:4992.4; Blood:700; NG/GT:30; IV Piggyback:450] Out: 1245 [Urine:1085; Emesis/NG output:60; Blood:100]  General appearance: alert, cooperative and no distress Heart: regular rate and rhythm Lungs: coarse throughout Extremities: edema trace Wound: clean and dry  Lab Results:  Recent Labs  11/27/12 1730 11/28/12 0430  WBC 9.3 10.7*  HGB 11.3* 11.0*  HCT 33.3* 32.4*  PLT 101* 101*   BMET:  Recent Labs  11/27/12 1728 11/27/12 1730 11/28/12 0430  NA 139  --  139  K 4.4  --  3.6  CL 109  --  106  CO2  --   --  23  GLUCOSE 106*  --  99  BUN 24*  --  21  CREATININE 1.20* 1.08 1.07  CALCIUM  --   --  8.8    PT/INR:  Recent Labs  11/27/12 1155  LABPROT 15.8*  INR 1.29   ABG    Component Value Date/Time   PHART 7.356 11/27/2012 1642   HCO3 23.4 11/27/2012 1642   TCO2 21  11/27/2012 1728   ACIDBASEDEF 2.0 11/27/2012 1642   O2SAT 94.0 11/27/2012 1642   CBG (last 3)   Recent Labs  11/27/12 2339 11/28/12 0415 11/28/12 0743  GLUCAP 77 103* 102*    Assessment/Plan: S/P Procedure(s) (LRB): TRANSCATHETER AORTIC VALVE REPLACEMENT, TRANSFEMORAL (N/A) INTRAOPERATIVE TRANSESOPHAGEAL ECHOCARDIOGRAM (N/A)  1. CV- Hemodynamically stable tate in the 80s, hypertensive this morning 2. Pulm- coarse breath sounds throughout, encourage use of IS 3. Incision clean and dry, no evidence of hematoma 4. Dispo- management per Dr. Excell Seltzer   LOS: 1 day    Kaylee Schmitt 11/28/2012  I have seen and examined the patient and agree with the assessment and plan as outlined.  Kaylee Schmitt H 11/28/2012 8:26 AM

## 2012-11-28 NOTE — Progress Notes (Signed)
MD Morrie Sheldon about pt. Swan location, Pa waveform showing the tip of the swan in the RV, MD ordered to pull swan, orders received and pt. Stable. Will continue to monitor. Jani Gravel

## 2012-11-29 ENCOUNTER — Encounter (HOSPITAL_COMMUNITY): Payer: Self-pay | Admitting: *Deleted

## 2012-11-29 LAB — GLUCOSE, CAPILLARY
Glucose-Capillary: 99 mg/dL (ref 70–99)
Glucose-Capillary: 82 mg/dL (ref 70–99)
Glucose-Capillary: 98 mg/dL (ref 70–99)

## 2012-11-29 LAB — BASIC METABOLIC PANEL
BUN: 18 mg/dL (ref 6–23)
CO2: 27 mEq/L (ref 19–32)
Chloride: 103 mEq/L (ref 96–112)
Glucose, Bld: 102 mg/dL — ABNORMAL HIGH (ref 70–99)
Potassium: 3.8 mEq/L (ref 3.5–5.1)
Sodium: 140 mEq/L (ref 135–145)

## 2012-11-29 LAB — CBC
HCT: 34.7 % — ABNORMAL LOW (ref 36.0–46.0)
Hemoglobin: 11.9 g/dL — ABNORMAL LOW (ref 12.0–15.0)
MCHC: 34.3 g/dL (ref 30.0–36.0)
RBC: 4.04 MIL/uL (ref 3.87–5.11)
WBC: 11 10*3/uL — ABNORMAL HIGH (ref 4.0–10.5)

## 2012-11-29 MED ORDER — FUROSEMIDE 10 MG/ML IJ SOLN
40.0000 mg | Freq: Two times a day (BID) | INTRAMUSCULAR | Status: DC
  Administered 2012-11-29 – 2012-12-01 (×5): 40 mg via INTRAVENOUS
  Filled 2012-11-29 (×6): qty 4

## 2012-11-29 MED ORDER — CLOPIDOGREL BISULFATE 75 MG PO TABS
75.0000 mg | ORAL_TABLET | Freq: Every day | ORAL | Status: DC
  Administered 2012-11-29 – 2012-12-03 (×5): 75 mg via ORAL
  Filled 2012-11-29 (×6): qty 1

## 2012-11-29 MED ORDER — POTASSIUM CHLORIDE CRYS ER 20 MEQ PO TBCR
20.0000 meq | EXTENDED_RELEASE_TABLET | Freq: Two times a day (BID) | ORAL | Status: DC
  Administered 2012-11-29 – 2012-12-03 (×9): 20 meq via ORAL
  Filled 2012-11-29 (×10): qty 1

## 2012-11-29 MED ORDER — AMLODIPINE BESYLATE 10 MG PO TABS
10.0000 mg | ORAL_TABLET | Freq: Every evening | ORAL | Status: DC
  Administered 2012-11-29 – 2012-12-02 (×4): 10 mg via ORAL
  Filled 2012-11-29 (×5): qty 1

## 2012-11-29 NOTE — Progress Notes (Signed)
    Subjective:  Feels fine this morning. No chest pain or dyspnea. Had to get up 6 times last night with incontinence.  Objective:  Vital Signs in the last 24 hours: Temp:  [97.6 F (36.4 C)-98.6 F (37 C)] 98.5 F (36.9 C) (08/21 0433) Pulse Rate:  [58-75] 64 (08/21 0430) Resp:  [13-28] 19 (08/21 0430) BP: (71-219)/(29-179) 171/75 mmHg (08/21 0430) SpO2:  [88 %-100 %] 99 % (08/21 0430) Weight:  [134 lb 14.7 oz (61.2 kg)] 134 lb 14.7 oz (61.2 kg) (08/21 0431)  Intake/Output from previous day: 08/20 0701 - 08/21 0700 In: 1115.5 [P.O.:240; I.V.:425.5; IV Piggyback:450] Out: 535 [Urine:535]  Physical Exam: Pt is alert and oriented, elderly woman in NAD HEENT: normal Neck: JVP - normal Lungs: coarse bilaterally CV: RRR with soft ejection murmur Abd: soft, NT, Positive BS, no hepatomegaly Ext: no C/C/E, distal pulses intact and equal Skin: warm/dry no rash  Lab Results:  Recent Labs  11/28/12 1700 11/28/12 1710 11/29/12 0420  WBC 10.1  --  11.0*  HGB 11.1* 11.2* 11.9*  PLT 102*  --  108*    Recent Labs  11/28/12 0430  11/28/12 1710 11/29/12 0420  NA 139  --  142 140  K 3.6  --  3.3* 3.8  CL 106  --  103 103  CO2 23  --   --  27  GLUCOSE 99  --  97 102*  BUN 21  --  17 18  CREATININE 1.07  < > 1.00 0.96  < > = values in this interval not displayed. No results found for this basename: TROPONINI, CK, MB,  in the last 72 hours  Cardiac Studies: 2D Echo: Study Conclusions  - Left ventricle: The cavity size was mildly dilated. Wall thickness was increased in a pattern of moderate LVH. Systolic function was normal. The estimated ejection fraction was in the range of 55% to 60%. - Aortic valve: 23mm Sapien XT valve in good position with low systolic gradient and persistant mild perivalvular regurgitation - Mitral valve: Calcified annulus. - Left atrium: The atrium was severely dilated. - Atrial septum: No defect or patent foramen ovale  was Identified.  CXR 8/20: IMPRESSION:  Slight increase interstitial prominence upper lungs may reflect  interstitial edema. Patchy opacity in the left mid and lower lung.  Electronically Signed  By: Charlett Nose  On: 11/28/2012 07:50  Tele: Sinus rhythm  Assessment/Plan:  1. Severe AS s/p TAVR. Echo reviewed. Normal valve gradients with mild AI.  2. HTN - difficult to control yesterday. meds adjusted with imdur/hydralazine added. Suspect volume overload/agitation contributing. Diurese with IV lasix today, increase amlodipine to 10 mg. Continue atenolol, hydralazine, avapro.  3. Agitation - tough night with incontinence, mild agitation. Resume foley today, d/c central line, tx stepdown.  I think she will need ST-SNF for rehab.  Tonny Bollman, M.D. 11/29/2012, 6:30 AM

## 2012-11-29 NOTE — Progress Notes (Signed)
      [address]       [name] [address]             [phone]     CARDIOTHORACIC SURGERY PROGRESS NOTE  2 Days Post-Op  S/P Procedure(s) (LRB): TRANSCATHETER AORTIC VALVE REPLACEMENT, TRANSFEMORAL (N/A) INTRAOPERATIVE TRANSESOPHAGEAL ECHOCARDIOGRAM (N/A)  Subjective: Looks good.  No complaints  Objective: Vital signs in last 24 hours: Temp:  [97.5 F (36.4 C)-98.6 F (37 C)] 97.7 F (36.5 C) (08/21 1318) Pulse Rate:  [59-75] 59 (08/21 1318) Cardiac Rhythm:  [-] Heart block (08/21 1037) Resp:  [17-27] 18 (08/21 1318) BP: (126-189)/(29-140) 179/48 mmHg (08/21 1318) SpO2:  [96 %-100 %] 98 % (08/21 1318) Weight:  [61.2 kg (134 lb 14.7 oz)] 61.2 kg (134 lb 14.7 oz) (08/21 0431)  Physical Exam:  Rhythm:   sinus  Breath sounds: Diminished at bases  Heart sounds:  RRR  Incisions:  Clean and dry  Abdomen:  Soft, non-distended, non-tender  Extremities:  Warm, well-perfused    Intake/Output from previous day: 08/20 0701 - 08/21 0700 In: 1115.5 [P.O.:240; I.V.:425.5; IV Piggyback:450] Out: 535 [Urine:535] Intake/Output this shift: Total I/O In: 610 [P.O.:360; IV Piggyback:250] Out: 700 [Urine:700]  Lab Results:  Recent Labs  11/28/12 1700 11/28/12 1710 11/29/12 0420  WBC 10.1  --  11.0*  HGB 11.1* 11.2* 11.9*  HCT 32.8* 33.0* 34.7*  PLT 102*  --  108*   BMET:  Recent Labs  11/28/12 0430  11/28/12 1710 11/29/12 0420  NA 139  --  142 140  K 3.6  --  3.3* 3.8  CL 106  --  103 103  CO2 23  --   --  27  GLUCOSE 99  --  97 102*  BUN 21  --  17 18  CREATININE 1.07  < > 1.00 0.96  CALCIUM 8.8  --   --  9.0  < > = values in this interval not displayed.  CBG (last 3)   Recent Labs  11/28/12 2325 11/29/12 0431 11/29/12 0749  GLUCAP 99 82 98   PT/INR:   Recent Labs  11/27/12 1155  LABPROT 15.8*  INR 1.29    CXR:  n/a  Assessment/Plan: S/P Procedure(s) (LRB): TRANSCATHETER AORTIC VALVE REPLACEMENT, TRANSFEMORAL  (N/A) INTRAOPERATIVE TRANSESOPHAGEAL ECHOCARDIOGRAM (N/A)  Making excellent progress  Anner Baity H 11/29/2012 5:43 PM

## 2012-11-29 NOTE — Progress Notes (Signed)
Physical Therapy Treatment Patient Details Name: Kaylee Schmitt MRN: [id number] DOB: Sep 20, 1923 Today's Date: 11/29/2012 Time: 5784-6962 PT Time Calculation (min): 23 min  PT Assessment / Plan / Recommendation  History of Present Illness s/p TAVR   PT Comments   Patient demonstrates improvements in cognition and participation today. Patient able to ambulate with assistance.  Feel patient will need short term rehabilitation prior to discharge home. Will continue to see as indicated and progress activity as tolerated.   Follow Up Recommendations  SNF           Equipment Recommendations  None recommended by PT       Frequency Min 3X/week   Progress towards PT Goals Progress towards PT goals: Progressing toward goals  Plan Discharge plan needs to be updated    Precautions / Restrictions     Pertinent Vitals/Pain No pain reported, spo2 on rm air 97%    Mobility  Bed Mobility Bed Mobility: Not assessed Details for Bed Mobility Assistance: pt recieved in chair Transfers Transfers: Sit to Stand;Stand to Sit Sit to Stand: 4: Min assist Stand to Sit: 4: Min assist Details for Transfer Assistance: VCs for hand placement, assist to elevate to standing and assist to control descent Ambulation/Gait Ambulation/Gait Assistance: 3: Mod assist Ambulation Distance (Feet): 140 Feet Assistive device: Rolling walker Ambulation/Gait Assistance Details: assist for stability and max cues for safe positioning and use of rw as well as multimodal cues to direct to task Gait Pattern: Step-through pattern;Decreased stride length;Shuffle;Narrow base of support Gait velocity: decreased General Gait Details: very slow gait speed    Exercises General Exercises - Lower Extremity Short Arc Quad: AROM;Both;5 reps Straight Leg Raises: AROM;Both;5 reps     PT Goals (current goals can now be found in the care plan section) Acute Rehab PT Goals Patient Stated Goal: to get some sleep PT Goal  Formulation: With patient Time For Goal Achievement: 12/12/12 Potential to Achieve Goals: Fair  Visit Information  Last PT Received On: 11/29/12 Assistance Needed: +1 History of Present Illness: s/p TAVR    Subjective Data  Subjective: lets forget about yesteday Patient Stated Goal: to get some sleep   Cognition  Cognition Arousal/Alertness: Awake/alert Behavior During Therapy: Anxious Overall Cognitive Status: No family/caregiver present to determine baseline cognitive functioning    Balance  Balance Balance Assessed: Yes Static Standing Balance Static Standing - Balance Support: Bilateral upper extremity supported Static Standing - Level of Assistance: 4: Min assist Static Standing - Comment/# of Minutes: 4 minutes  End of Session PT - End of Session Equipment Utilized During Treatment: Gait belt Activity Tolerance: Patient tolerated treatment well Patient left: in chair;with call bell/phone within reach;with nursing/sitter in room;with family/visitor present Nurse Communication: Mobility status   GP     Fabio Asa 11/29/2012, 1:35 PM Charlotte Crumb, PT DPT  [phone]

## 2012-11-29 NOTE — Progress Notes (Signed)
Anesthesiology Follow-up:  Awake and alert, neuro intact in good spirits.  VS: T-36.5 BP 177/51 HR 66 RR 22 O2 Sat 97% on 2L  K-3.8 H/H 11.9/34.7 PLTS 108,000  Extubated 4 hours post-op.   Patient doing well POD #2 TAVR, now on PO antihypertensives. Plan to transfer to [facility] today.  Kipp Brood

## 2012-11-30 LAB — TYPE AND SCREEN
Unit division: 0
Unit division: 0

## 2012-11-30 LAB — BASIC METABOLIC PANEL
CO2: 27 mEq/L (ref 19–32)
Calcium: 9 mg/dL (ref 8.4–10.5)
Chloride: 101 mEq/L (ref 96–112)
Creatinine, Ser: 1.01 mg/dL (ref 0.50–1.10)
Glucose, Bld: 87 mg/dL (ref 70–99)

## 2012-11-30 NOTE — Progress Notes (Signed)
Clinical Social Work Department BRIEF PSYCHOSOCIAL ASSESSMENT 11/30/2012  Patient:  Kaylee Schmitt, Kaylee Schmitt     Account Number:  [id number]     Admit date:  11/27/2012  Clinical Social Worker:  Carren Rang  Date/Time:  11/30/2012 12:00 M  Referred by:  Care Management  Date Referred:  11/29/2012 Referred for  SNF Placement   Other Referral:   Interview type:  Patient Other interview type:    PSYCHOSOCIAL DATA Living Status:  FAMILY Admitted from facility:   Level of care:   Primary support name:  Christine Primary support relationship to patient:  CHILD, ADULT Degree of support available:   Good    CURRENT CONCERNS Current Concerns  Post-Acute Placement   Other Concerns:    SOCIAL WORK ASSESSMENT / PLAN Clinical Social Worker received referral for SNF placement at d/c. CSW introduced self and explained reason for visit. CSW explained SNF process to patient.  Patient reported she is agreeable for SNF placement and prefers [facility]. CSW encouraged patient and  to think about additional SNF options pending availability of preferred facility. CSW will complete FL2 for MD's signature and will update patient and family when bed offers are received.   Assessment/plan status:  Psychosocial Support/Ongoing Assessment of Needs Other assessment/ plan:   Information/referral to community resources:   SNF    PATIENT'S/FAMILY'S RESPONSE TO PLAN OF CARE: Patient states she prefers [facility] and gave CSW permission to follow up with her daughter.       Maree Krabbe, MSW, Theresia Majors [phone]

## 2012-11-30 NOTE — Progress Notes (Addendum)
[address]       [place] [address]             [phone]      3 Days Post-Op  Procedure(s) (LRB): TRANSCATHETER AORTIC VALVE REPLACEMENT, TRANSFEMORAL (N/A) INTRAOPERATIVE TRANSESOPHAGEAL ECHOCARDIOGRAM (N/A) Subjective: Feeling stronger  Objective  Telemetry sinus with occ PVC's  Temp:  [97.5 F (36.4 C)-98.6 F (37 C)] 98.6 F (37 C) (08/22 0433) Pulse Rate:  [56-71] 59 (08/22 0433) Resp:  [17-27] 18 (08/22 0433) BP: (143-185)/(48-57) 163/50 mmHg (08/22 0433) SpO2:  [90 %-100 %] 90 % (08/22 0433) FiO2 (%):  [90 %] 90 % (08/22 0433) Weight:  [132 lb 15 oz (60.3 kg)] 132 lb 15 oz (60.3 kg) (08/22 0433)   Intake/Output Summary (Last 24 hours) at 11/30/12 0816 Last data filed at 11/30/12 0741  Gross per 24 hour  Intake    480 ml  Output   1725 ml  Net  -1245 ml       General appearance: alert, cooperative and no distress Heart: regular rate and rhythm and soft sys murmur Lungs: clear to auscultation bilaterally Abdomen: benign exam Extremities: no edema Wound: incis healing well  Lab Results:  Recent Labs  11/28/12 0430 11/28/12 1700  11/29/12 0420 11/30/12 0445  NA 139  --   < > 140 137  K 3.6  --   < > 3.8 3.7  CL 106  --   < > 103 101  CO2 23  --   --  27 27  GLUCOSE 99  --   < > 102* 87  BUN 21  --   < > 18 19  CREATININE 1.07 1.01  < > 0.96 1.01  CALCIUM 8.8  --   --  9.0 9.0  MG 1.6 1.6  --   --   --   < > = values in this interval not displayed. No results found for this basename: AST, ALT, ALKPHOS, BILITOT, PROT, ALBUMIN,  in the last 72 hours No results found for this basename: LIPASE, AMYLASE,  in the last 72 hours  Recent Labs  11/28/12 1700 11/28/12 1710 11/29/12 0420  WBC 10.1  --  11.0*  HGB 11.1* 11.2* 11.9*  HCT 32.8* 33.0* 34.7*  MCV 85.9  --  85.9  PLT 102*  --  108*   No results found for this basename: CKTOTAL, CKMB, TROPONINI,  in the last 72 hours No components found with this basename:  POCBNP,  No results found for this basename: DDIMER,  in the last 72 hours No results found for this basename: HGBA1C,  in the last 72 hours No results found for this basename: CHOL, HDL, LDLCALC, TRIG, CHOLHDL,  in the last 72 hours No results found for this basename: TSH, T4TOTAL, FREET3, T3FREE, THYROIDAB,  in the last 72 hours No results found for this basename: VITAMINB12, FOLATE, FERRITIN, TIBC, IRON, RETICCTPCT,  in the last 72 hours  Medications: Scheduled . acetaminophen  1,000 mg Oral Q6H   Or  . acetaminophen (TYLENOL) oral liquid 160 mg/5 mL  1,000 mg Per Tube Q6H  . amLODipine  10 mg Oral QPM  . aspirin  81 mg Oral Daily  . atenolol  25 mg Oral q morning - 10a  . bisacodyl  10 mg Oral Daily   Or  . bisacodyl  10 mg Rectal Daily  . clopidogrel  75 mg Oral Q breakfast  . docusate sodium  200 mg Oral  Daily  . furosemide  40 mg Intravenous BID  . hydrALAZINE  25 mg Oral Q8H  . irbesartan  150 mg Oral Daily  . isosorbide mononitrate  60 mg Oral Daily  . pantoprazole  40 mg Oral Daily  . potassium chloride  20 mEq Oral BID  . sertraline  100 mg Oral q morning - 10a     Radiology/Studies:  No results found.  INR: Will add last result for INR, ABG once components are confirmed Will add last 4 CBG results once components are confirmed  Assessment/Plan: S/P Procedure(s) (LRB): TRANSCATHETER AORTIC VALVE REPLACEMENT, TRANSFEMORAL (N/A) INTRAOPERATIVE TRANSESOPHAGEAL ECHOCARDIOGRAM (N/A)  1 Overall doing well with good progress with recovery 2 might need additional HTN management- as per cardiology 3 push rehab as able, ambulation improving   LOS: 3 days    GOLD,WAYNE E 8/22/20148:16 AM  I have seen and examined the patient and agree with the assessment and plan as outlined.  I agree with management outlined by Dr Excell Seltzer and will continue to defer any changes in antihypertensive medications to his discretion.  She has extremely non-compliant vessels and should not  be over-treated with medications.  Ashdon Gillson H 11/30/2012 4:23 PM

## 2012-11-30 NOTE — Progress Notes (Signed)
    Subjective:  No CP or dyspnea. Feels ok.  Objective:  Vital Signs in the last 24 hours: Temp:  [97.7 F (36.5 C)-98.6 F (37 C)] 98.6 F (37 C) (08/22 0433) Pulse Rate:  [56-64] 64 (08/22 1059) Resp:  [18] 18 (08/22 0433) BP: (143-179)/(48-53) 174/50 mmHg (08/22 1059) SpO2:  [90 %-98 %] 90 % (08/22 0433) FiO2 (%):  [90 %] 90 % (08/22 0433) Weight:  [132 lb 15 oz (60.3 kg)] 132 lb 15 oz (60.3 kg) (08/22 0913)  Intake/Output from previous day: 08/21 0701 - 08/22 0700 In: 610 [P.O.:360; IV Piggyback:250] Out: 1850 [Urine:1850]  Physical Exam: Pt is alert and oriented, pleasant elderly woman in NAD HEENT: normal Neck: JVP - normal Lungs: CTA bilaterally CV: RRR with with grade 2/6 systolic murmur at the LSB, no diastolic murmur Abd: soft, NT, Positive BS, no hepatomegaly Ext: no C/C/E, distal pulses intact and equal Skin: warm/dry no rash   Lab Results:  Recent Labs  11/28/12 1700 11/28/12 1710 11/29/12 0420  WBC 10.1  --  11.0*  HGB 11.1* 11.2* 11.9*  PLT 102*  --  108*    Recent Labs  11/29/12 0420 11/30/12 0445  NA 140 137  K 3.8 3.7  CL 103 101  CO2 27 27  GLUCOSE 102* 87  BUN 18 19  CREATININE 0.96 1.01   No results found for this basename: TROPONINI, CK, MB,  in the last 72 hours  Tele: Sinus rhythm, no arrhythmia  Assessment/Plan:  1. Severe AS s/p TAVR. She has done very well. Medically stable for discharge awaiting ST-SNF placement.  2. HTN - systolic pressures still elevated but control better on multidrug therapy. She has extremely calcified arteries with wide pulse pressure. I would tolerate systolic pressures aroun 160 mmHg.  Dispo: PT has recommended ST-SNF. Await placement. Ready for discharge when bed available. Should have follow-up with Dr Mayford Knife within 2 weeks of discharge and we will see her back in [facility] when she is 4 weeks out from TAVR with a repeat echo at that time.   Tonny Bollman, M.D. 11/30/2012, 1:03  PM

## 2012-11-30 NOTE — Care Management Note (Signed)
    Page 1 of 1   12/03/2012     3:41:21 PM   CARE MANAGEMENT NOTE 12/03/2012  Patient:  Kaylee Schmitt, Kaylee Schmitt   Account Number:  [id number]  Date Initiated:  11/30/2012  Documentation initiated by:  Linn Goetze  Subjective/Objective Assessment:   PT S/P TAVR ON 11/27/12.  PTA, PT LIVES AT HOME WITH DAUGHTER.  P.T. RECOMMENDATION IS FOR SHORT TERM SNF AT DC.     Action/Plan:   CSW CONSULTED TO FACILITATE DC TO SNF WHEN MEDICALLY STABLE.  WILL FOLLOW PROGRESS.   Anticipated DC Date:  12/03/2012   Anticipated DC Plan:  SKILLED NURSING FACILITY  In-house referral  Clinical Social Worker      DC Planning Services  CM consult      Choice offered to / List presented to:             Status of service:  Completed, signed off Medicare Important Message given?   (If response is "NO", the following Medicare IM given date fields will be blank) Date Medicare IM given:   Date Additional Medicare IM given:    Discharge Disposition:  SKILLED NURSING FACILITY  Per UR Regulation:  Reviewed for med. necessity/level of care/duration of stay  If discussed at Long Length of Stay Meetings, dates discussed:    Comments:  12/03/12 Kaylee Schmitt, BSN [phone] PT DISCHARGED TO [facility] TODAY, PER CSW ARRANGEMENTS.

## 2012-11-30 NOTE — Progress Notes (Signed)
Clinical Social Work Department CLINICAL SOCIAL WORK PLACEMENT NOTE 11/30/2012  Patient:  Kaylee Schmitt, Kaylee Schmitt  Account Number:  [id number] Admit date:  11/27/2012  Clinical Social Worker:  Carren Rang  Date/time:  11/30/2012 08:56 AM  Clinical Social Work is seeking post-discharge placement for this patient at the following level of care:   SKILLED NURSING   (*CSW will update this form in Epic as items are completed)   11/30/2012  Patient/family provided with [facility] Department of Clinical Social Work's list of facilities offering this level of care within the geographic area requested by the patient (or if unable, by the patient's family).  11/30/2012  Patient/family informed of their freedom to choose among providers that offer the needed level of care, that participate in Medicare, Medicaid or managed care program needed by the patient, have an available bed and are willing to accept the patient.  11/30/2012  Patient/family informed of [facility]' ownership interest in [facility], as well as of the fact that they are under no obligation to receive care at this facility.  PASARR submitted to EDS on 11/30/2012 PASARR number received from EDS on 11/30/2012  FL2 transmitted to all facilities in geographic area requested by pt/family on  11/30/2012 FL2 transmitted to all facilities within larger geographic area on   Patient informed that his/her managed care company has contracts with or will negotiate with  certain facilities, including the following:     Patient/family informed of bed offers received:   Patient chooses bed at  Physician recommends and patient chooses bed at    Patient to be transferred to  on   Patient to be transferred to facility by   The following physician request were entered in Epic:   Additional Comments:   Maree Krabbe, MSW, [employer] [phone]

## 2012-11-30 NOTE — Progress Notes (Signed)
Clinical Social Worker provided patient with skilled nursing facility bed offers. Patient appears open to SNF option and gave CSW permission to follow up with [facility]. CSW spoke with [facility] and confirmed availability for Monday if patient is medically ready. Will update further when new information arises.  Maree Krabbe, MSW, [facility] [phone]

## 2012-12-01 DIAGNOSIS — I119 Hypertensive heart disease without heart failure: Secondary | ICD-10-CM

## 2012-12-01 DIAGNOSIS — I509 Heart failure, unspecified: Secondary | ICD-10-CM

## 2012-12-01 DIAGNOSIS — Z954 Presence of other heart-valve replacement: Secondary | ICD-10-CM

## 2012-12-01 DIAGNOSIS — I5031 Acute diastolic (congestive) heart failure: Secondary | ICD-10-CM

## 2012-12-01 MED ORDER — HYDRALAZINE HCL 50 MG PO TABS
50.0000 mg | ORAL_TABLET | Freq: Three times a day (TID) | ORAL | Status: DC
  Administered 2012-12-01 – 2012-12-03 (×7): 50 mg via ORAL
  Filled 2012-12-01 (×9): qty 1

## 2012-12-01 MED ORDER — FUROSEMIDE 40 MG PO TABS
40.0000 mg | ORAL_TABLET | Freq: Two times a day (BID) | ORAL | Status: DC
  Administered 2012-12-01 – 2012-12-03 (×4): 40 mg via ORAL
  Filled 2012-12-01 (×6): qty 1

## 2012-12-01 NOTE — Progress Notes (Signed)
SUBJECTIVE: [name] is feeling well and without complaints, specifically denying chest pain and shortness of breath. Had a headache earlier this morning which has since resolved.   Filed Vitals:   11/30/12 1059 11/30/12 1339 11/30/12 2034 12/01/12 0428  BP: 174/50 151/50 158/53 179/59  Pulse: 64 60 60 61  Temp:  98.4 F (36.9 C) 99.1 F (37.3 C) 98.1 F (36.7 C)  TempSrc:  Oral Oral Oral  Resp:  18 19 20   Height:      Weight:    129 lb 6.6 oz (58.7 kg)  SpO2:  96% 94% 93%    Intake/Output Summary (Last 24 hours) at 12/01/12 0857 Last data filed at 12/01/12 0435  Gross per 24 hour  Intake    240 ml  Output   1600 ml  Net  -1360 ml    PHYSICAL EXAM General: NAD Neck: No JVD, no thyromegaly or thyroid nodule.  Lungs: Clear to auscultation bilaterally with normal respiratory effort. CV: Nondisplaced PMI.  Heart regular S1/S2, no S3/S4, I/VI holosystolic murmur.  No peripheral edema. Normal pedal pulses.  Abdomen: Soft, nontender, no hepatosplenomegaly, no distention.  Neurologic: Alert and oriented x 3.  Psych: Normal affect. Extremities: No clubbing or cyanosis.    LABS: Basic Metabolic Panel:  Recent Labs  30/86/57 1700  11/29/12 0420 11/30/12 0445  NA  --   < > 140 137  K  --   < > 3.8 3.7  CL  --   < > 103 101  CO2  --   --  27 27  GLUCOSE  --   < > 102* 87  BUN  --   < > 18 19  CREATININE 1.01  < > 0.96 1.01  CALCIUM  --   --  9.0 9.0  MG 1.6  --   --   --   < > = values in this interval not displayed. Liver Function Tests: No results found for this basename: AST, ALT, ALKPHOS, BILITOT, PROT, ALBUMIN,  in the last 72 hours No results found for this basename: LIPASE, AMYLASE,  in the last 72 hours CBC:  Recent Labs  11/28/12 1700 11/28/12 1710 11/29/12 0420  WBC 10.1  --  11.0*  HGB 11.1* 11.2* 11.9*  HCT 32.8* 33.0* 34.7*  MCV 85.9  --  85.9  PLT 102*  --  108*   Cardiac Enzymes: No results found for this basename: CKTOTAL, CKMB,  CKMBINDEX, TROPONINI,  in the last 72 hours BNP: No components found with this basename: POCBNP,  D-Dimer: No results found for this basename: DDIMER,  in the last 72 hours Hemoglobin A1C: No results found for this basename: HGBA1C,  in the last 72 hours Fasting Lipid Panel: No results found for this basename: CHOL, HDL, LDLCALC, TRIG, CHOLHDL, LDLDIRECT,  in the last 72 hours Thyroid Function Tests: No results found for this basename: TSH, T4TOTAL, FREET3, T3FREE, THYROIDAB,  in the last 72 hours Anemia Panel: No results found for this basename: VITAMINB12, FOLATE, FERRITIN, TIBC, IRON, RETICCTPCT,  in the last 72 hours  RADIOLOGY: Dg Chest 2 View    11/28/2012   CLINICAL DATA:  Postop valve replacement.  EXAM: PORTABLE CHEST - 1 VIEW  COMPARISON:  11/27/2012  FINDINGS: Interval removal of Swan-Ganz catheter, endotracheal tube and NG tube. Right central line remains in place, unchanged. Postoperative changes. Dense calcifications within the descending aorta and aortic arch. Mild cardiomegaly. Vascular congestion and diffuse interstitial prominence throughout the lungs, stable patchy  airspace disease in the left mid and lower lung  IMPRESSION: Slight increase interstitial prominence upper lungs may reflect interstitial edema. Patchy opacity in the left mid and lower lung.   Electronically Signed   By: Charlett Nose   On: 11/28/2012 07:50   Dg Chest Portable 1 View  11/27/2012   *RADIOLOGY REPORT*  Clinical Data: Postop aortic valve replacement.  PORTABLE CHEST - 1 VIEW  Comparison: 11/23/2012.  Findings: Endotracheal tube terminates 1.4 cm above the carina. Nasogastric tube is followed into the stomach with the tip projecting beyond the inferior margin of the image.  Right IJ central line tip projects over the SVC.  Right IJ Swan-Ganz catheter tip projects over the proximal right pulmonary artery.  Heart is enlarged, stable.  Thoracic aorta is calcified. Aortic valve repair noted. Central  pulmonary vascular congestion with mild mixed interstitial and air space disease bilaterally.  No definite pneumothorax.  No definite pleural fluid.  IMPRESSION:  1.  Endotracheal tube is slightly low-lying.  Retracting approximately 2 cm would better position the tip above the carina. These results will be called to the ordering clinician or representative by the Radiologist Assistant, and communication documented in the PACS Dashboard. 2.  Central pulmonary vascular congestion with mild bilateral mixed interstitial airspace disease, indicative of edema. 3.  No pneumothorax.   Original Report Authenticated By: Leanna Battles, M.D.   Ct Angio Chest Aorta W/cm &/or Wo/cm   ECHO (11/28/12) Study Conclusions  - Left ventricle: The cavity size was mildly dilated. Wall thickness was increased in a pattern of moderate LVH. Systolic function was normal. The estimated ejection fraction was in the range of 55% to 60%. - Aortic valve: 23mm Sapien XT valve in good position with low systolic gradient and persistant mild perivalvular regurgitation - Mitral valve: Calcified annulus. - Left atrium: The atrium was severely dilated.     ASSESSMENT AND PLAN: 1. Severe aortic stenosis s/p TAVR: doing well with well-seated valve and only mild paravalvular leak. 2. HTN: still elevated but as per Dr. Excell Seltzer, given her extremely calcified arteries with wide pulse pressure, systolic pressures around 160 mmHg acceptable. She's been having systolic readings of 179 mmHg. Will increase scheduled Hydralazine to 50 mg TID, and is already on prn Hydralazine. 3. Pulmonary edema: resolved. Adequate urine output. Will switch IV Lasix back to her oral home regimen. 4. Disposition: plan to discharge to [facility] on 12/03/12. Should have follow-up with Dr Mayford Knife within 2 weeks of discharge, and will be seen in Valve Clinic when she is 4 weeks out from TAVR with a repeat echo at that time.      Prentice Docker, M.D.,  F.A.C.C.

## 2012-12-01 NOTE — Progress Notes (Addendum)
      [address]       Kaylee Schmitt [address]             [phone]        4 Days Post-Op Procedure(s) (LRB): TRANSCATHETER AORTIC VALVE REPLACEMENT, TRANSFEMORAL (N/A) INTRAOPERATIVE TRANSESOPHAGEAL ECHOCARDIOGRAM (N/A)  Subjective: Patient sitting in chair, reading paper. She has no specific complaints.  Objective: Vital signs in last 24 hours: Temp:  [98.1 F (36.7 C)-99.1 F (37.3 C)] 98.1 F (36.7 C) (08/23 0428) Pulse Rate:  [60-64] 61 (08/23 0428) Cardiac Rhythm:  [-] Normal sinus rhythm;Heart block (08/22 1930) Resp:  [18-20] 20 (08/23 0428) BP: (151-179)/(50-59) 179/59 mmHg (08/23 0428) SpO2:  [93 %-96 %] 93 % (08/23 0428) Weight:  [58.7 kg (129 lb 6.6 oz)-60.3 kg (132 lb 15 oz)] 58.7 kg (129 lb 6.6 oz) (08/23 0428)  Pre op weight  59 kg Current Weight  12/01/12 58.7 kg (129 lb 6.6 oz)       Intake/Output from previous day: 08/22 0701 - 08/23 0700 In: 360 [P.O.:360] Out: 1600 [Urine:1600]   Physical Exam:  Cardiovascular: RRR, Grade II/VI systolic murmur Pulmonary: Clear to auscultation bilaterally; no rales, wheezes, or rhonchi. Abdomen: Soft, non tender, bowel sounds present. Extremities: Mild bilateral lower extremity edema. Left groin with ecchymosis. Wounds: Clean and dry.  No erythema or signs of infection.  Lab Results: CBC: Recent Labs  11/28/12 1700 11/28/12 1710 11/29/12 0420  WBC 10.1  --  11.0*  HGB 11.1* 11.2* 11.9*  HCT 32.8* 33.0* 34.7*  PLT 102*  --  108*   BMET:  Recent Labs  11/29/12 0420 11/30/12 0445  NA 140 137  K 3.8 3.7  CL 103 101  CO2 27 27  GLUCOSE 102* 87  BUN 18 19  CREATININE 0.96 1.01  CALCIUM 9.0 9.0    PT/INR:  Lab Results  Component Value Date   INR 1.29 11/27/2012   INR 1.08 11/23/2012   INR 1.14 10/17/2012   ABG:  INR: Will add last result for INR, ABG once components are confirmed Will add last 4 CBG results once components are confirmed  Assessment/Plan:  1. CV - SR,  occ PVCs.Hypertensive, but per Dr. Excell Seltzer ok for SBP to be around 160.  On Norvasc 10 at night, Atenolol 25 daily, Hydralazine 25 tid, Avapro 150 daily, Imdur 60 daily, and Plavix 75 daily. 2.  Pulmonary - Encourage incentive spirometer 3. Volume Overload - On Lasix 40 IV bid. Foley remains 4.Mild ABL anemia-Last H and H 11.9 and 34.7 5.Management per Dr. Excell Seltzer. She will likely need SNF when ready for discharge.  Kaylee Schmitt,Kaylee Schmitt 12/01/2012,7:50 AM  patient examined and medical record reviewed,agree with above note. VAN TRIGT III,Kaylee Schmitt 12/01/2012

## 2012-12-02 DIAGNOSIS — I2510 Atherosclerotic heart disease of native coronary artery without angina pectoris: Secondary | ICD-10-CM

## 2012-12-02 NOTE — Progress Notes (Signed)
SUBJECTIVE: Kaylee Schmitt feels well and is without any complaints whatsoever. She says "my breathing is so much better" after having had the procedure.   Filed Vitals:   12/01/12 0428 12/01/12 1300 12/01/12 1957 12/02/12 0437  BP: 179/59 109/57 111/50 180/41  Pulse: 61 59 56 52  Temp: 98.1 F (36.7 C) 97.8 F (36.6 C) 98 F (36.7 C) 98.3 F (36.8 C)  TempSrc: Oral Oral Oral Oral  Resp: 20 16 18 20   Height:      Weight: 129 lb 6.6 oz (58.7 kg)   132 lb 3.2 oz (59.966 kg)  SpO2: 93% 100% 98% 96%    Intake/Output Summary (Last 24 hours) at 12/02/12 0915 Last data filed at 12/01/12 1829  Gross per 24 hour  Intake    120 ml  Output    100 ml  Net     20 ml    PHYSICAL EXAM General: NAD Neck: No JVD, no thyromegaly or thyroid nodule.  Lungs: Clear to auscultation bilaterally with normal respiratory effort. CV: Nondisplaced PMI.  Heart regular S1/prosthetic S2, no S3/S4, soft I/VI systolic murmur, no diastolic murmur.  Trace peripheral edema.  No carotid bruit.  Normal pedal pulses.  Abdomen: Soft, nontender, no hepatosplenomegaly, no distention.  Neurologic: Alert and oriented x 3.  Psych: Normal affect. Extremities: No clubbing or cyanosis.   TELEMETRY: Reviewed telemetry pt in normal sinus rhythm.  LABS: Basic Metabolic Panel:  Recent Labs  16/10/96 0445  NA 137  K 3.7  CL 101  CO2 27  GLUCOSE 87  BUN 19  CREATININE 1.01  CALCIUM 9.0   Liver Function Tests: No results found for this basename: AST, ALT, ALKPHOS, BILITOT, PROT, ALBUMIN,  in the last 72 hours No results found for this basename: LIPASE, AMYLASE,  in the last 72 hours CBC: No results found for this basename: WBC, NEUTROABS, HGB, HCT, MCV, PLT,  in the last 72 hours Cardiac Enzymes: No results found for this basename: CKTOTAL, CKMB, CKMBINDEX, TROPONINI,  in the last 72 hours BNP: No components found with this basename: POCBNP,  D-Dimer: No results found for this basename: DDIMER,  in the  last 72 hours Hemoglobin A1C: No results found for this basename: HGBA1C,  in the last 72 hours Fasting Lipid Panel: No results found for this basename: CHOL, HDL, LDLCALC, TRIG, CHOLHDL, LDLDIRECT,  in the last 72 hours Thyroid Function Tests: No results found for this basename: TSH, T4TOTAL, FREET3, T3FREE, THYROIDAB,  in the last 72 hours Anemia Panel: No results found for this basename: VITAMINB12, FOLATE, FERRITIN, TIBC, IRON, RETICCTPCT,  in the last 72 hours  RADIOLOGY:  Dg Chest Portable 1 View In Am  11/28/2012   CLINICAL DATA:  Postop valve replacement.  EXAM: PORTABLE CHEST - 1 VIEW  COMPARISON:  11/27/2012  FINDINGS: Interval removal of Swan-Ganz catheter, endotracheal tube and NG tube. Right central line remains in place, unchanged. Postoperative changes. Dense calcifications within the descending aorta and aortic arch. Mild cardiomegaly. Vascular congestion and diffuse interstitial prominence throughout the lungs, stable patchy airspace disease in the left mid and lower lung  IMPRESSION: Slight increase interstitial prominence upper lungs may reflect interstitial edema. Patchy opacity in the left mid and lower lung.   Electronically Signed   By: Charlett Nose   On: 11/28/2012 07:50   Dg Chest Portable 1 View  11/27/2012   *RADIOLOGY REPORT*  Clinical Data: Postop aortic valve replacement.  PORTABLE CHEST - 1 VIEW  Comparison: 11/23/2012.  Findings: Endotracheal tube terminates 1.4 cm above the carina. Nasogastric tube is followed into the stomach with the tip projecting beyond the inferior margin of the image.  Right IJ central line tip projects over the SVC.  Right IJ Swan-Ganz catheter tip projects over the proximal right pulmonary artery.  Heart is enlarged, stable.  Thoracic aorta is calcified. Aortic valve repair noted. Central pulmonary vascular congestion with mild mixed interstitial and air space disease bilaterally.  No definite pneumothorax.  No definite pleural fluid.   IMPRESSION:  1.  Endotracheal tube is slightly low-lying.  Retracting approximately 2 cm would better position the tip above the carina. These results will be called to the ordering clinician or representative by the Radiologist Assistant, and communication documented in the PACS Dashboard. 2.  Central pulmonary vascular congestion with mild bilateral mixed interstitial airspace disease, indicative of edema. 3.  No pneumothorax.   Original Report Authenticated By: Leanna Battles, M.D.    ECHO (11/28/12)  Study Conclusions  - Left ventricle: The cavity size was mildly dilated. Wall thickness was increased in a pattern of moderate LVH. Systolic function was normal. The estimated ejection fraction was in the range of 55% to 60%. - Aortic valve: 23mm Sapien XT valve in good position with low systolic gradient and persistant mild perivalvular regurgitation - Mitral valve: Calcified annulus. - Left atrium: The atrium was severely dilated.   ASSESSMENT AND PLAN:  1. Severe aortic stenosis s/p TAVR: doing well with well-seated valve and only mild paravalvular leak. No diastolic murmur appreciated. On Plavix 75 mg daily. 2. HTN: some normal readings but still occasionally elevated with SBP in 180 mmHg range. Per Dr. Excell Seltzer, given her extremely calcified arteries with wide pulse pressure, systolic pressures around 160 mmHg acceptable. I increased Hydralazine to 50 mg TID on 8/23, and she is already on prn Hydralazine, and taking Amlodipine 10 mg daily, Atenolol 25 mg daily, Irbesartan 150 mg daily, and Imdur 60 mg daily. 3. Pulmonary edema: resolved. Euvolemic currently. On oral home regimen of Furosemide.  4. Disposition: plan to discharge to [facility] on 12/03/12. Should have follow-up with Dr Mayford Knife within 2 weeks of discharge, and will be seen in Valve Clinic when she is 4 weeks out from TAVR with a repeat echo at that time.        Prentice Docker, M.D., F.A.C.C.

## 2012-12-02 NOTE — Progress Notes (Addendum)
      [address]       [name] [address]             [phone]        5 Days Post-Op Procedure(s) (LRB): TRANSCATHETER AORTIC VALVE REPLACEMENT, TRANSFEMORAL (N/A) INTRAOPERATIVE TRANSESOPHAGEAL ECHOCARDIOGRAM (N/A)  Subjective: Patient sitting in chair and she has no specific complaints.  Objective: Vital signs in last 24 hours: Temp:  [97.8 F (36.6 C)-98.3 F (36.8 C)] 98.3 F (36.8 C) (08/24 0437) Pulse Rate:  [52-59] 52 (08/24 0437) Cardiac Rhythm:  [-] Normal sinus rhythm;Heart block (08/23 2030) Resp:  [16-20] 20 (08/24 0437) BP: (109-180)/(41-57) 180/41 mmHg (08/24 0437) SpO2:  [96 %-100 %] 96 % (08/24 0437) Weight:  [59.966 kg (132 lb 3.2 oz)] 59.966 kg (132 lb 3.2 oz) (08/24 0437)  Pre op weight  59 kg Current Weight  12/02/12 59.966 kg (132 lb 3.2 oz)       Intake/Output from previous day: 08/23 0701 - 08/24 0700 In: 240 [P.O.:240] Out: 100 [Urine:100]   Physical Exam:  Cardiovascular: RRR, Grade II/VI systolic murmur Pulmonary: Clear to auscultation bilaterally; no rales, wheezes, or rhonchi. Abdomen: Soft, non tender, bowel sounds present. Extremities: Mild bilateral lower extremity edema. Left groin with ecchymosis. Wounds: Clean and dry.  No erythema or signs of infection.  Lab Results: CBC:No results found for this basename: WBC, HGB, HCT, PLT,  in the last 72 hours BMET:   Recent Labs  11/30/12 0445  NA 137  K 3.7  CL 101  CO2 27  GLUCOSE 87  BUN 19  CREATININE 1.01  CALCIUM 9.0    PT/INR:  Lab Results  Component Value Date   INR 1.29 11/27/2012   INR 1.08 11/23/2012   INR 1.14 10/17/2012   ABG:  INR: Will add last result for INR, ABG once components are confirmed Will add last 4 CBG results once components are confirmed  Assessment/Plan:  1. CV - SR, occ PVCs.Hypertensive, but per Dr. Excell Seltzer ok for SBP to be around 160.  On Norvasc 10 at night, Atenolol 25 daily, Hydralazine 25 tid, Avapro 150 daily,  Imdur 60 daily, and Plavix 75 daily. 2.  Pulmonary - Encourage incentive spirometer 3. Volume Overload - On Lasix 40 PO bid. Foley remains 4.Mild ABL anemia-Last H and H 11.9 and 34.7 5.Management per Dr. Excell Seltzer. She will need SNF when ready for discharge.  ZIMMERMAN,DONIELLE MPA-C 12/02/2012,8:23 AM   patient examined and medical record reviewed,agree with above note. VAN TRIGT III,PETER 12/02/2012

## 2012-12-03 ENCOUNTER — Encounter (HOSPITAL_COMMUNITY): Payer: Self-pay | Admitting: Nurse Practitioner

## 2012-12-03 DIAGNOSIS — I350 Nonrheumatic aortic (valve) stenosis: Secondary | ICD-10-CM

## 2012-12-03 MED ORDER — CLOPIDOGREL BISULFATE 75 MG PO TABS: 75.0000 mg | ORAL_TABLET | Freq: Every day | ORAL | Status: AC

## 2012-12-03 MED ORDER — HYDRALAZINE HCL 50 MG PO TABS: 50.0000 mg | ORAL_TABLET | Freq: Three times a day (TID) | ORAL | Status: DC

## 2012-12-03 MED ORDER — AMLODIPINE BESYLATE 10 MG PO TABS: 10.0000 mg | ORAL_TABLET | Freq: Every evening | ORAL | Status: DC

## 2012-12-03 MED ORDER — ISOSORBIDE MONONITRATE ER 60 MG PO TB24: 60.0000 mg | ORAL_TABLET | Freq: Every day | ORAL | Status: DC

## 2012-12-03 NOTE — Progress Notes (Signed)
Patient has confirmed bed at [facility]. Awaiting dc order and summary.  Maree Krabbe, MSW, [facility]ors [phone]

## 2012-12-03 NOTE — Progress Notes (Signed)
Clinical Social Work Department CLINICAL SOCIAL WORK PLACEMENT NOTE 12/03/2012  Patient:  MKENZIE, DOTTS  Account Number:  [id number] Admit date:  11/27/2012  Clinical Social Worker:  Carren Rang  Date/time:  11/30/2012 08:56 AM  Clinical Social Work is seeking post-discharge placement for this patient at the following level of care:   SKILLED NURSING   (*CSW will update this form in Epic as items are completed)   11/30/2012  Patient/family provided with [facility] Department of Clinical Social Work's list of facilities offering this level of care within the geographic area requested by the patient (or if unable, by the patient's family).  11/30/2012  Patient/family informed of their freedom to choose among providers that offer the needed level of care, that participate in Medicare, Medicaid or managed care program needed by the patient, have an available bed and are willing to accept the patient.  11/30/2012  Patient/family informed of [facility]' ownership interest in [facility], as well as of the fact that they are under no obligation to receive care at this facility.  PASARR submitted to EDS on 11/30/2012 PASARR number received from EDS on 11/30/2012  FL2 transmitted to all facilities in geographic area requested by pt/family on  11/30/2012 FL2 transmitted to all facilities within larger geographic area on   Patient informed that his/her managed care company has contracts with or will negotiate with  certain facilities, including the following:     Patient/family informed of bed offers received:  11/30/2012 Patient chooses bed at [facility] Physician recommends and patient chooses bed at    Patient to be transferred to [facility] on  12/03/2012 Patient to be transferred to facility by family  The following physician request were entered in Epic:   Additional Comments:  Maree Krabbe, MSW, [employer] [phone]

## 2012-12-03 NOTE — Progress Notes (Addendum)
Assessment unchanged. Discussed D/C instructions with pt and dtr. RX called into pharmacy. D/C summary placed in packet. DTR instructed to give packet to SNF. IV and tele removed. Pt left via W/C accompanied by NT. DTR to take pt to [facility].

## 2012-12-03 NOTE — Progress Notes (Signed)
Physical Therapy Treatment Patient Details Name: Kaylee Schmitt MRN: [id number] DOB: 1923/08/21 Today's Date: 12/03/2012 Time: 1191-4782 PT Time Calculation (min): 27 min  PT Assessment / Plan / Recommendation  History of Present Illness s/p TAVR   PT Comments   Pt progressing with gait but continues to have impaired balance and needs cueing for safety with transfers. Max cueing for safety and RW use. Will continue to follow.   Follow Up Recommendations        Does the patient have the potential to tolerate intense rehabilitation     Barriers to Discharge        Equipment Recommendations       Recommendations for Other Services    Frequency     Progress towards PT Goals Progress towards PT goals: Progressing toward goals  Plan Current plan remains appropriate    Precautions / Restrictions Precautions Precautions: Fall   Pertinent Vitals/Pain No pain VSS    Mobility  Bed Mobility Rolling Right: 4: Min assist Details for Bed Mobility Assistance: assist to fully elevate trunk from surface Transfers Sit to Stand: 4: Min guard;From bed Stand to Sit: 4: Min guard;To chair/3-in-1 Details for Transfer Assistance: cueing for hand placement and safety Ambulation/Gait Ambulation/Gait Assistance: 4: Min assist Ambulation Distance (Feet): 170 Feet Assistive device: Rolling walker Ambulation/Gait Assistance Details: cueing for posture, position in RW and safety as pt reaching out for handrail x 3 with gait with LOB with assist to correct balance and cueing to avoid obstacles Gait Pattern: Step-through pattern;Decreased stride length;Shuffle;Narrow base of support Gait velocity: very slow Stairs: No    Exercises     PT Diagnosis:    PT Problem List:   PT Treatment Interventions:     PT Goals (current goals can now be found in the care plan section)    Visit Information  Last PT Received On: 12/03/12 Assistance Needed: +1 History of Present Illness: s/p TAVR     Subjective Data      Cognition  Cognition Arousal/Alertness: Awake/alert Behavior During Therapy: WFL for tasks assessed/performed Overall Cognitive Status: Impaired/Different from baseline Area of Impairment: Safety/judgement;Problem solving    Balance     End of Session PT - End of Session Equipment Utilized During Treatment: Gait belt Activity Tolerance: Patient tolerated treatment well Patient left: in chair;with call bell/phone within reach;with chair alarm set (chair alarm present but battery dead- RN aware) Nurse Communication: Mobility status   GP     Toney Sang [name] 12/03/2012, 11:56 AM Delaney Meigs, PT [phone]

## 2012-12-03 NOTE — Progress Notes (Addendum)
[address]       [place] [address]             [phone]      6 Days Post-Op  Procedure(s) (LRB): TRANSCATHETER AORTIC VALVE REPLACEMENT, TRANSFEMORAL (N/A) INTRAOPERATIVE TRANSESOPHAGEAL ECHOCARDIOGRAM (N/A) Subjective: Feels ok, without complaints  Objective  Telemetry sinus rhythm/sinus brady  Temp:  [97.5 F (36.4 C)-98 F (36.7 C)] 98 F (36.7 C) (08/25 0447) Pulse Rate:  [57-59] 59 (08/25 0447) Resp:  [16-20] 16 (08/25 0447) BP: (135-179)/(35-72) 135/72 mmHg (08/25 0447) SpO2:  [95 %-98 %] 98 % (08/25 0447) Weight:  [128 lb 11.2 oz (58.378 kg)] 128 lb 11.2 oz (58.378 kg) (08/25 0447)   Intake/Output Summary (Last 24 hours) at 12/03/12 0808 Last data filed at 12/02/12 1825  Gross per 24 hour  Intake    762 ml  Output   1100 ml  Net   -338 ml       General appearance: alert, cooperative and no distress Heart: regular rate and rhythm Lungs: clear to auscultation bilaterally Abdomen: benign Extremities: minor edema Wound: incisions healing well  Lab Results: No results found for this basename: NA, K, CL, CO2, GLUCOSE, BUN, CREATININE, CALCIUM, MG, PHOS,  in the last 72 hours No results found for this basename: AST, ALT, ALKPHOS, BILITOT, PROT, ALBUMIN,  in the last 72 hours No results found for this basename: LIPASE, AMYLASE,  in the last 72 hours No results found for this basename: WBC, NEUTROABS, HGB, HCT, MCV, PLT,  in the last 72 hours No results found for this basename: CKTOTAL, CKMB, TROPONINI,  in the last 72 hours No components found with this basename: POCBNP,  No results found for this basename: DDIMER,  in the last 72 hours No results found for this basename: HGBA1C,  in the last 72 hours No results found for this basename: CHOL, HDL, LDLCALC, TRIG, CHOLHDL,  in the last 72 hours No results found for this basename: TSH, T4TOTAL, FREET3, T3FREE, THYROIDAB,  in the last 72 hours No results found for this basename:  VITAMINB12, FOLATE, FERRITIN, TIBC, IRON, RETICCTPCT,  in the last 72 hours  Medications: Scheduled . amLODipine  10 mg Oral QPM  . aspirin  81 mg Oral Daily  . atenolol  25 mg Oral q morning - 10a  . bisacodyl  10 mg Oral Daily   Or  . bisacodyl  10 mg Rectal Daily  . clopidogrel  75 mg Oral Q breakfast  . docusate sodium  200 mg Oral Daily  . furosemide  40 mg Oral BID  . hydrALAZINE  50 mg Oral Q8H  . irbesartan  150 mg Oral Daily  . isosorbide mononitrate  60 mg Oral Daily  . pantoprazole  40 mg Oral Daily  . potassium chloride  20 mEq Oral BID  . sertraline  100 mg Oral q morning - 10a     Radiology/Studies:  No results found.  INR: Will add last result for INR, ABG once components are confirmed Will add last 4 CBG results once components are confirmed  Assessment/Plan: S/P Procedure(s) (LRB): TRANSCATHETER AORTIC VALVE REPLACEMENT, TRANSFEMORAL (N/A) INTRAOPERATIVE TRANSESOPHAGEAL ECHOCARDIOGRAM (N/A)  1 doing well 2 d/c foley 3 awaits SNF 4 management per DR Excell Seltzer      LOS: 6 days    GOLD,WAYNE E 8/25/20148:08 AM  I have seen and examined the patient and agree with the assessment and plan as outlined.  Need to get foley catheter out.  Looks to be ready for d/c to SNF  [facility] 12/03/2012 8:31 AM

## 2012-12-03 NOTE — Progress Notes (Signed)
Patient Name: DEZIRAE SERVICE Date of Encounter: 12/03/2012   Principal Problem:   Severe aortic stenosis Active Problems:   S/P TAVR (transcatheter aortic valve replacement)   Acute diastolic CHF (congestive heart failure)   Hypertensive heart disease   CAD (coronary artery disease)   History of CVA (cerebrovascular accident)   Hyperlipidemia   SUBJECTIVE  No c/p or sob.  Received laxative earlier and feels mildly nauseated.  Eager to go to SNF.  Bed available @ [facility]  CURRENT MEDS . amLODipine  10 mg Oral QPM  . aspirin  81 mg Oral Daily  . atenolol  25 mg Oral q morning - 10a  . bisacodyl  10 mg Oral Daily   Or  . bisacodyl  10 mg Rectal Daily  . clopidogrel  75 mg Oral Q breakfast  . docusate sodium  200 mg Oral Daily  . furosemide  40 mg Oral BID  . hydrALAZINE  50 mg Oral Q8H  . irbesartan  150 mg Oral Daily  . isosorbide mononitrate  60 mg Oral Daily  . pantoprazole  40 mg Oral Daily  . potassium chloride  20 mEq Oral BID  . sertraline  100 mg Oral q morning - 10a   OBJECTIVE  Filed Vitals:   12/02/12 1334 12/02/12 2033 12/03/12 0447 12/03/12 1044  BP: 149/54 179/35 135/72 147/39  Pulse: 58 57 59 59  Temp: 97.5 F (36.4 C) 97.9 F (36.6 C) 98 F (36.7 C)   TempSrc: Oral Oral Oral   Resp: 20 20 16    Height:      Weight:   128 lb 11.2 oz (58.378 kg)   SpO2: 96% 95% 98%     Intake/Output Summary (Last 24 hours) at 12/03/12 1256 Last data filed at 12/03/12 0700  Gross per 24 hour  Intake    582 ml  Output   1151 ml  Net   -569 ml   Filed Weights   12/01/12 0428 12/02/12 0437 12/03/12 0447  Weight: 129 lb 6.6 oz (58.7 kg) 132 lb 3.2 oz (59.966 kg) 128 lb 11.2 oz (58.378 kg)    PHYSICAL EXAM  General: Pleasant, NAD. Neuro: Alert and oriented X 3. Moves all extremities spontaneously. Psych: Normal affect. HEENT:  Normal  Neck: Supple without bruits or JVD. Lungs:  Resp regular and unlabored, CTA. Heart: RRR no s3, s4, soft systolic murmur  @ RUSB. Abdomen: Soft, non-tender, non-distended, BS + x 4.  Extremities: No clubbing, cyanosis or edema. DP/PT/Radials 2+ and equal bilaterally.  Accessory Clinical Findings  none  TELE  rsr  ASSESSMENT AND PLAN  1.  Sev AS s/p TAVR:  Doing well and ready for d/c to SNF today.  Cont current meds.  F/u in [place] office with Dr. Mayford Knife in 2 wks and TAVR clinic in 4 wks with echo @ that time.  2. HTN:  Somewhat improved control over past 24 hrs.  Will leave meds as is.  3.  Acute on chronic diast chf:  Euvolemic.  Breathing improved.  HR stable.  BP improving.  She is +3300 for admission but weight is down to 128 lbs.  Cont current meds and diuretic dose.  Signed, Nicolasa Ducking NP  Post op echo with normal EF and mild perivalvular regurgitations.  Post op ECG with trifasicular block but no high grade AV block.  Some bruising at groin and RIJ site  OK fo d/c to SNF and long term f/u with TAVR clinic and Armanda Magic  [facility] cardiology  Charlton Haws

## 2012-12-03 NOTE — Progress Notes (Signed)
Clinical Social Worker facilitated patient discharge by contacting the patient, family and facility, [facility]. Patient agreeable to this plan and arranging transport via family . CSW will sign off, as social work intervention is no longer needed.  Vijay Durflinger, MSW, LCSWA [phone] 

## 2012-12-03 NOTE — Progress Notes (Signed)
Bed confirmed at [facility]. Awaiting dc order and dc summary. Daughter transporting patient to facility  Maree Krabbe, MSW, [place] [phone]

## 2012-12-03 NOTE — Discharge Summary (Signed)
Patient ID: Kaylee Schmitt,  MRN: [id number], DOB/AGE: 11/01/1923 79 y.o.  Admit date: 11/27/2012 Discharge date: 12/03/2012  Primary Cardiologist: T. Mayford Knife, MD TAVR Clinic f/u: C. Cornelius Moras, MD / Judie Petit. Excell Seltzer, MD   Discharge Diagnoses Principal Problem:   Severe aortic stenosis  **S/P TAVR (transcatheter aortic valve replacement)   **Edwards Sapien XT THV (sixe 23 mm).  Active Problems:   Acute diastolic CHF (congestive heart failure)  **D/C weight of 128 lbs.   Hypertensive heart disease   CAD (coronary artery disease)   History of CVA (cerebrovascular accident)   Hyperlipidemia  Allergies Allergies  Allergen Reactions  . Adhesive [Tape] Rash    Paper tape OK.  Marland Kitchen Penicillins Itching and Rash  . Sulfa Antibiotics Itching and Rash   Procedures  8.19.2014 TAVR  Edwards Sapien XT THV (size 23 mm) _____________   8.20.2014 2D Echocardiogram   Study Conclusions  - Left ventricle: The cavity size was mildly dilated. Wall   thickness was increased in a pattern of moderate LVH.   Systolic function was normal. The estimated ejection   fraction was in the range of 55% to 60%. - Aortic valve: 23mm Sapien XT valve in good position with   low systolic gradient and persistant mild perivalvular   regurgitation - Mitral valve: Calcified annulus. - Left atrium: The atrium was severely dilated. - Atrial septum: No defect or patent foramen ovale was   identified. 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  She was more recently extensively evaluated in TAVR clinic by Drs. Fanny Dance, [place], and [employer].  She was felt to be an appropriate candidate for TAVR and after discussing risks and benefits, she was willing to proceed.    Hospital Course  Patient presented to the [facility] OR for TAVR on 8/19.  With TEE guidance, she  underwent successful TAVR using a 23mm Sapien XT valve.  Post-op, she was successfully extubated and hemodynamics were stable.  She was noted to have mild volume overload and hypertension requiring titration of blood pressure meds and IV diuresis.  Follow-up 2D echo on 8/20, showed normal LV function with properly functioning Sapien valve.  She was seen by physical therapy, who recommended SNF.   Patient remained hemodynamically stable with significant symptomatic improvement.  Social work assisted the family in finding a skilled nursing facility and bed placement has been found today at [facility].    Discharge Vitals Blood pressure 147/39, pulse 59, temperature 98 F (36.7 C), temperature source Oral, resp. rate 16, height 4' 11.06" (1.5 m), weight 128 lb 11.2 oz (58.378 kg), SpO2 98.00%.  Filed Weights   12/01/12 0428 12/02/12 0437 12/03/12 0447  Weight: 129 lb 6.6 oz (58.7 kg) 132 lb 3.2 oz (59.966 kg) 128 lb 11.2 oz (58.378 kg)    Labs  CBC Lab Results  Component Value Date   WBC 11.0* 11/29/2012   HGB 11.9* 11/29/2012   HCT 34.7* 11/29/2012   MCV 85.9 11/29/2012   PLT 108* 11/29/2012   Basic Metabolic Panel Lab Results  Component Value Date   CREATININE 1.01 11/30/2012   BUN 19 11/30/2012   NA 137 11/30/2012   K 3.7 11/30/2012   CL 101 11/30/2012   CO2 27 11/30/2012   Disposition  Pt is being discharged home today in good condition.  Follow-up Plans & Appointments  Follow-up Information   Follow up with [facility] Echo Room 1 On 12/28/2012. (2:00 PM - [facility].)       Follow up with Drs. Renard Matter On 12/28/2012. (3:00 PM)    Contact information:   [facility]      Follow up with Quintella Reichert, MD On 12/18/2012. (3:30 PM)    Specialty:  Cardiology   Contact information:   [address] [place] [address] [phone]       Discharge Medications    Medication List         amLODipine 10 MG tablet    Commonly known as:  NORVASC  Take 1 tablet (10 mg total) by mouth every evening.     aspirin EC 81 MG tablet  Take 81 mg by mouth every morning.     atenolol 50 MG tablet  Commonly known as:  TENORMIN  Take 25 mg by mouth every morning.     clopidogrel 75 MG tablet  Commonly known as:  PLAVIX  Take 1 tablet (75 mg total) by mouth daily with breakfast.     furosemide 40 MG tablet  Commonly known as:  LASIX  Take 40 mg by mouth 2 (two) times daily.     hydrALAZINE 50 MG tablet  Commonly known as:  APRESOLINE  Take 1 tablet (50 mg total) by mouth every 8 (eight) hours.     isosorbide mononitrate 60 MG 24 hr tablet  Commonly known as:  IMDUR  Take 1 tablet (60 mg total) by mouth daily.     omeprazole 20 MG capsule  Commonly known as:  PRILOSEC  Take 20 mg by mouth every morning.     potassium chloride SA 20 MEQ tablet  Commonly known as:  K-DUR,KLOR-CON  Take 1 tablet (20 mEq total) by mouth daily.     sertraline 100 MG tablet  Commonly known as:  ZOLOFT  Take 100 mg by mouth every morning.     valsartan 160 MG tablet  Commonly known as:  DIOVAN  Take 160 mg by mouth every morning.      Outstanding Labs/Studies  F/u echo in 4 wks.  Duration of Discharge Encounter   Greater than 30 minutes including physician time.  Signed, Nicolasa Ducking NP 12/03/2012, 1:55 PM

## 2012-12-04 ENCOUNTER — Encounter (HOSPITAL_COMMUNITY): Payer: Self-pay | Admitting: Cardiovascular Disease

## 2012-12-04 ENCOUNTER — Non-Acute Institutional Stay (SKILLED_NURSING_FACILITY): Payer: Medicare Other | Admitting: Adult Health

## 2012-12-04 DIAGNOSIS — K219 Gastro-esophageal reflux disease without esophagitis: Secondary | ICD-10-CM

## 2012-12-04 DIAGNOSIS — F329 Major depressive disorder, single episode, unspecified: Secondary | ICD-10-CM | POA: Insufficient documentation

## 2012-12-04 DIAGNOSIS — I119 Hypertensive heart disease without heart failure: Secondary | ICD-10-CM

## 2012-12-04 DIAGNOSIS — Z952 Presence of prosthetic heart valve: Secondary | ICD-10-CM

## 2012-12-04 DIAGNOSIS — Z954 Presence of other heart-valve replacement: Secondary | ICD-10-CM

## 2012-12-04 DIAGNOSIS — I5031 Acute diastolic (congestive) heart failure: Secondary | ICD-10-CM

## 2012-12-04 DIAGNOSIS — F3289 Other specified depressive episodes: Secondary | ICD-10-CM

## 2012-12-04 DIAGNOSIS — I509 Heart failure, unspecified: Secondary | ICD-10-CM

## 2012-12-04 DIAGNOSIS — I2510 Atherosclerotic heart disease of native coronary artery without angina pectoris: Secondary | ICD-10-CM

## 2012-12-04 NOTE — Assessment & Plan Note (Addendum)
Is without change in status; will continue lasix 40 mg twice daily with k+ 20 meq daily and apresoline 50 mg three times daily  will place her on daily weights with the staff to call for weight gain of > 3 pounds in one day or 5 pounds in one week.

## 2012-12-04 NOTE — Assessment & Plan Note (Signed)
She is doing well is at this facility for short term rehab with her goal to return back home.

## 2012-12-04 NOTE — Assessment & Plan Note (Signed)
She is presently stable is doing well with zoloft 100 mg daily will not make changes will monitor her status

## 2012-12-04 NOTE — Assessment & Plan Note (Signed)
She is stable will continue asa 81 mg daily plavix 75 mg daily and imdur 60 mg daily will monitor her status

## 2012-12-04 NOTE — Progress Notes (Signed)
Patient ID: Kaylee Schmitt, female   DOB: 07-Apr-1924, 79 y.o.   MRN: [id number]  [facility]  Allergies  Allergen Reactions  . Adhesive [Tape] Rash    Paper tape OK.  Marland Kitchen Penicillins Itching and Rash  . Sulfa Antibiotics Itching and Rash     Chief Complaint  Patient presents with  . Hospitalization Follow-up    HPI:   She has been hospitalized for an aortic valve replacement. She is here for short term rehab; she is not voicing any complaints at this time; the nursing staff is not voicing any concerns at this time.   Past Medical History  Diagnosis Date  . Aortic stenosis   . CHF (congestive heart failure)   . CAD (coronary artery disease) 10/12/2012    Cath 2011  Calcified LM,  80% mid LAD, dominant circ without sig disease, Ostial nondominant RCA.  Severe AS  Liberte stent x 2 2011 Dr. Amil Amen    . History of CVA (cerebrovascular accident)     2003 while living in [place].   Right brain with left hemiparesis   . Gout 10/12/2012  . Hypertensive heart disease 10/12/2012  . Hyperlipidemia 10/12/2012  . Hypertension   . Chronic kidney disease   . Shortness of breath   . Stroke   . S/P TAVR (transcatheter aortic valve replacement) 11/27/2012    Stephannie Peters XT transcatheter heart valve (size 23mm) placed via open left transfemoral approach    Past Surgical History  Procedure Laterality Date  . Coronary stent placement    . Cholecystectomy    . Appendectomy    . Cataract extraction    . Cardiac catheterization    . Transcatheter aortic valve replacement, transfemoral N/A 11/27/2012    Procedure: TRANSCATHETER AORTIC VALVE REPLACEMENT, TRANSFEMORAL;  Surgeon: Tonny Bollman, MD;  Location: [facility] OR;  Service: Cardiovascular;  Laterality: N/A;  . Intraoperative transesophageal echocardiogram N/A 11/27/2012    Procedure: INTRAOPERATIVE TRANSESOPHAGEAL ECHOCARDIOGRAM;  Surgeon: Tonny Bollman, MD;  Location: [facility] OR;  Service: Cardiovascular;  Laterality: N/A;    VITAL SIGNS BP 138/70   Pulse 76  Ht 4\' 11"  (1.499 m)  Wt 128 lb (58.06 kg)  BMI 25.84 kg/m2   Patient's Medications  New Prescriptions   No medications on file  Previous Medications   AMLODIPINE (NORVASC) 10 MG TABLET    Take 1 tablet (10 mg total) by mouth every evening.   ASPIRIN EC 81 MG TABLET    Take 81 mg by mouth every morning.   ATENOLOL (TENORMIN) 50 MG TABLET    Take 25 mg by mouth every morning.    CLOPIDOGREL (PLAVIX) 75 MG TABLET    Take 1 tablet (75 mg total) by mouth daily with breakfast.   FUROSEMIDE (LASIX) 40 MG TABLET    Take 40 mg by mouth 2 (two) times daily.    HYDRALAZINE (APRESOLINE) 50 MG TABLET    Take 1 tablet (50 mg total) by mouth every 8 (eight) hours.   ISOSORBIDE MONONITRATE (IMDUR) 60 MG 24 HR TABLET    Take 1 tablet (60 mg total) by mouth daily.   OMEPRAZOLE (PRILOSEC) 20 MG CAPSULE    Take 20 mg by mouth every morning.    POTASSIUM CHLORIDE SA (K-DUR,KLOR-CON) 20 MEQ TABLET    Take 1 tablet (20 mEq total) by mouth daily.   SERTRALINE (ZOLOFT) 100 MG TABLET    Take 100 mg by mouth every morning.    VALSARTAN (DIOVAN) 160 MG TABLET    Take  160 mg by mouth every morning.   Modified Medications   No medications on file  Discontinued Medications   No medications on file    SIGNIFICANT DIAGNOSTIC EXAMS  11-07-12: ct angio of chest: 1.  The appearance of the chest suggests congestive heart failure with moderate pulmonary edema and small bilateral pleural effusions, 2. Atherosclerosis, including left main and three-vessel coronaryartery disease.3.  In addition, there is extensive tortuosity and atherosclerosis of the subclavian arteries bilaterally (left greater than right),with marked narrowing of the proximal subclavian artery whichmeasures only 2-3 mm and minimal diameter.  11-07-12: ct of abdomen and pelvis: 1.  Vascular findings and measurements pertinent to potential TAVR procedure, as detailed above.  The patient does appear to have a suitable left sided pelvic arterial access  in terms of vessel size (for low profile devices). However, there is extensive tortuosity of the external iliac arteries bilaterally. 2. Trace volume of ascites.  This may relate to passive hepatic congestion from underlying congestive heart failure. 3.  Small locule of gas noted nondependently within the lumen of the urinary bladder.  No Foley balloon catheter is noted at this time.  This may be iatrogenic if the patient has recently been catheterized for urinalysis.  Alternatively, this could be indicative of infection with gas forming organisms.  Clinical correlation is recommended. 4.  Extensive colonic diverticulosis without findings to suggest acute diverticulitis at this time. 5.  Additional incidental findings, as above.  11-23-12: chest x-ray: Mild interstitial edema.  No acute abnormality is noted.  11-28-12: chest x-ray: Slight increase interstitial prominence upper lungs may reflect interstitial edema. Patchy opacity in the left mid and lower lung.    LABS REVIEWED:   11-29-12: wbc 11.0; hgb 11.9; hct 34.7; mcv 85.9; plt 108; glucose 102; bun 18; creat 0.96; k+3.8 Na++ 140 11-30-12: glucose 87; bun 19; creat 1.01; k+ 3.7; na++ 137   Review of Systems  Constitutional: Negative for malaise/fatigue.  Eyes: Negative for blurred vision.  Respiratory: Negative for cough and shortness of breath.   Cardiovascular: Negative for chest pain, palpitations and leg swelling.  Gastrointestinal: Negative for heartburn, abdominal pain and constipation.  Musculoskeletal: Negative for myalgias and joint pain.  Skin: Negative.   Neurological: Negative for dizziness and headaches.  Psychiatric/Behavioral: Negative for depression. The patient is not nervous/anxious and does not have insomnia.     Physical Exam  Constitutional: She is oriented to person, place, and time. She appears well-nourished.  Neck: Neck supple. No JVD present. No thyromegaly present.  Cardiovascular: Normal rate, regular  rhythm and intact distal pulses.   Respiratory: Effort normal and breath sounds normal. No respiratory distress. She has no wheezes.  GI: Soft. Bowel sounds are normal. She exhibits no distension. There is no tenderness.  Musculoskeletal: Normal range of motion. She exhibits no edema.  Neurological: She is alert and oriented to person, place, and time.  Skin: Skin is warm and dry.  Psychiatric: She has a normal mood and affect.      ASSESSMENT/ PLAN:  Acute diastolic CHF (congestive heart failure) Is without change in status; will continue lasix 40 mg twice daily with k+ 20 meq daily and apresoline 50 mg three times daily  will place her on daily weights with the staff to call for weight gain of > 3 pounds in one day or 5 pounds in one week.   CAD (coronary artery disease) She is stable will continue asa 81 mg daily plavix 75 mg daily and imdur 60 mg  daily will monitor her status   Hypertensive heart disease Blood pressure is stable will continue norvasc 10 mg daily diovan 160 mg daily and atenolol 25 mg daily and will monitor her status.   S/P TAVR (transcatheter aortic valve replacement) She is doing well is at this facility for short term rehab with her goal to return back home.   GERD (gastroesophageal reflux disease) Will continue prilosec 20 mg daily   Depression She is presently stable is doing well with zoloft 100 mg daily will not make changes will monitor her status    Time spent with patient 50 minutes.

## 2012-12-04 NOTE — Assessment & Plan Note (Signed)
Will continue prilosec 20 mg daily  

## 2012-12-04 NOTE — Assessment & Plan Note (Signed)
Blood pressure is stable will continue norvasc 10 mg daily diovan 160 mg daily and atenolol 25 mg daily and will monitor her status.

## 2012-12-13 ENCOUNTER — Encounter: Payer: Self-pay | Admitting: Internal Medicine

## 2012-12-13 ENCOUNTER — Non-Acute Institutional Stay (SKILLED_NURSING_FACILITY): Payer: Medicare Other | Admitting: Internal Medicine

## 2012-12-13 DIAGNOSIS — K219 Gastro-esophageal reflux disease without esophagitis: Secondary | ICD-10-CM

## 2012-12-13 DIAGNOSIS — I2510 Atherosclerotic heart disease of native coronary artery without angina pectoris: Secondary | ICD-10-CM

## 2012-12-13 DIAGNOSIS — I350 Nonrheumatic aortic (valve) stenosis: Secondary | ICD-10-CM

## 2012-12-13 DIAGNOSIS — I509 Heart failure, unspecified: Secondary | ICD-10-CM

## 2012-12-13 DIAGNOSIS — R5381 Other malaise: Secondary | ICD-10-CM

## 2012-12-13 DIAGNOSIS — I359 Nonrheumatic aortic valve disorder, unspecified: Secondary | ICD-10-CM

## 2012-12-13 DIAGNOSIS — Z8673 Personal history of transient ischemic attack (TIA), and cerebral infarction without residual deficits: Secondary | ICD-10-CM

## 2012-12-13 DIAGNOSIS — F329 Major depressive disorder, single episode, unspecified: Secondary | ICD-10-CM

## 2012-12-13 DIAGNOSIS — I119 Hypertensive heart disease without heart failure: Secondary | ICD-10-CM

## 2012-12-13 DIAGNOSIS — I5031 Acute diastolic (congestive) heart failure: Secondary | ICD-10-CM

## 2012-12-13 NOTE — Progress Notes (Signed)
Patient ID: Kaylee Schmitt, female   DOB: 1923-05-21, 79 y.o.   MRN: [id number]  [facility]    PCP: Quintella Reichert, MD  Code Status: DNR  Allergies  Allergen Reactions  . Adhesive [Tape] Rash    Paper tape OK.  Marland Kitchen Penicillins Itching and Rash  . Sulfa Antibiotics Itching and Rash    Chief Complaint  Patient presents with  . new admission     HPI:  79 y/o female patient underwent TAVR for her severe symptomatic aortic stenosis on 11/27/12. She had mild volume overload post surgery with HTN and required iv diuresis and titration of her blood pressure medication. She is here for STR and goal is for her to return home. She was seen in her room today. She is working with PT and OT. She had a mechanical fall 4 days back, no injury reported besides bruising on her left leg. Able to move all 4 extremities. She is aaox 3 and in no distress. She is hard of hearing. She complaints of her breakfast being bad with cold pancakes and no tea/ coffee. No other complaints. See ROS  Review of Systems  Constitutional: Negative for malaise/fatigue.  Eyes: Negative for blurred vision.  Respiratory: Negative for cough and shortness of breath. using her incentive spirometer Cardiovascular: Negative for chest pain, palpitations and leg swelling.  Gastrointestinal: mild generalized abdominal discomfort after her breakfast.Negative for heartburn, nausea, vomiting and constipation.  Musculoskeletal: Negative for myalgias and joint pain.  Skin: Negative.   Neurological: Negative for dizziness and headaches.  Psychiatric/Behavioral: Negative for depression. The patient is not nervous/anxious and does not have insomnia.      Past Medical History  Diagnosis Date  . Aortic stenosis   . CHF (congestive heart failure)   . CAD (coronary artery disease) 10/12/2012    Cath 2011  Calcified LM,  80% mid LAD, dominant circ without sig disease, Ostial nondominant RCA.  Severe AS  Liberte stent x 2 2011 Dr. Amil Amen     . History of CVA (cerebrovascular accident)     2003 while living in [place].   Right brain with left hemiparesis   . Gout 10/12/2012  . Hypertensive heart disease 10/12/2012  . Hyperlipidemia 10/12/2012  . Hypertension   . Chronic kidney disease   . Shortness of breath   . Stroke   . S/P TAVR (transcatheter aortic valve replacement) 11/27/2012    Stephannie Peters XT transcatheter heart valve (size 23mm) placed via open left transfemoral approach   Past Surgical History  Procedure Laterality Date  . Coronary stent placement    . Cholecystectomy    . Appendectomy    . Cataract extraction    . Cardiac catheterization    . Transcatheter aortic valve replacement, transfemoral N/A 11/27/2012    Procedure: TRANSCATHETER AORTIC VALVE REPLACEMENT, TRANSFEMORAL;  Surgeon: Tonny Bollman, MD;  Location: [facility] OR;  Service: Cardiovascular;  Laterality: N/A;  . Intraoperative transesophageal echocardiogram N/A 11/27/2012    Procedure: INTRAOPERATIVE TRANSESOPHAGEAL ECHOCARDIOGRAM;  Surgeon: Tonny Bollman, MD;  Location: [facility] OR;  Service: Cardiovascular;  Laterality: N/A;   Social History:   reports that she has never smoked. She does not have any smokeless tobacco history on file. She reports that she does not drink alcohol or use illicit drugs.  Family History  Problem Relation Age of Onset  . Cancer - Prostate Father   . CAD Mother     Medications: Patient's Medications  New Prescriptions   No medications on file  Previous  Medications   AMLODIPINE (NORVASC) 10 MG TABLET    Take 1 tablet (10 mg total) by mouth every evening.   ASPIRIN EC 81 MG TABLET    Take 81 mg by mouth every morning.   ATENOLOL (TENORMIN) 50 MG TABLET    Take 25 mg by mouth every morning.    CLOPIDOGREL (PLAVIX) 75 MG TABLET    Take 1 tablet (75 mg total) by mouth daily with breakfast.   FUROSEMIDE (LASIX) 40 MG TABLET    Take 40 mg by mouth 2 (two) times daily.    HYDRALAZINE (APRESOLINE) 50 MG TABLET    Take 1 tablet (50 mg  total) by mouth every 8 (eight) hours.   ISOSORBIDE MONONITRATE (IMDUR) 60 MG 24 HR TABLET    Take 1 tablet (60 mg total) by mouth daily.   OMEPRAZOLE (PRILOSEC) 20 MG CAPSULE    Take 20 mg by mouth every morning.    POTASSIUM CHLORIDE SA (K-DUR,KLOR-CON) 20 MEQ TABLET    Take 1 tablet (20 mEq total) by mouth daily.   SERTRALINE (ZOLOFT) 100 MG TABLET    Take 100 mg by mouth every morning.    VALSARTAN (DIOVAN) 160 MG TABLET    Take 160 mg by mouth every morning.   Modified Medications   No medications on file  Discontinued Medications   No medications on file     Physical Exam:  BP 124/69  Pulse 73  Temp(Src) 97.1 F (36.2 C)  Resp 18  Wt 124 lb 12.8 oz (56.609 kg)  BMI 25.19 kg/m2  SpO2 99%  Constitutional: She is oriented to person, place, and time. She appears well-nourished.  Neck: Neck supple. No JVD present. No thyromegaly present.  Cardiovascular: Normal rate, regular rhythm and intact distal pulses.   Respiratory: Effort normal and breath sounds normal. No respiratory distress. She has no wheezes or crackles  GI: Soft. Bowel sounds are normal. She exhibits no distension. There is no tenderness, guarding or rigidity Musculoskeletal: Normal range of motion. She exhibits no edema. Resolving bruise in left shin area Neurological: She is alert and oriented to person, place, and time.  Skin: Skin is warm and dry.  Psychiatric: She has a normal mood and affect.    Labs reviewed: Basic Metabolic Panel:  Recent Labs  40/98/11 1730 11/28/12 0430 11/28/12 1700 11/28/12 1710 11/29/12 0420 11/30/12 0445  NA  --  139  --  142 140 137  K  --  3.6  --  3.3* 3.8 3.7  CL  --  106  --  103 103 101  CO2  --  23  --   --  27 27  GLUCOSE  --  99  --  97 102* 87  BUN  --  21  --  17 18 19   CREATININE 1.08 1.07 1.01 1.00 0.96 1.01  CALCIUM  --  8.8  --   --  9.0 9.0  MG 1.7 1.6 1.6  --   --   --    Liver Function Tests:  Recent Labs  10/12/12 1300 11/23/12 1424  AST 13  14  ALT 7 6  ALKPHOS 83 72  BILITOT 1.4* 0.6  PROT 6.3 5.9*  ALBUMIN 3.6 3.6   CBC:  Recent Labs  11/28/12 0430 11/28/12 1700 11/28/12 1710 11/29/12 0420  WBC 10.7* 10.1  --  11.0*  HGB 11.0* 11.1* 11.2* 11.9*  HCT 32.4* 32.8* 33.0* 34.7*  MCV 85.9 85.9  --  85.9  PLT 101*  102*  --  108*   Cardiac Enzymes:  Recent Labs  10/12/12 1300 10/12/12 1925 10/13/12 0040  TROPONINI <0.30 <0.30 <0.30   BNP: No components found with this basename: POCBNP,  CBG:  Recent Labs  11/28/12 2325 11/29/12 0431 11/29/12 0749  GLUCAP 99 82 98    Radiological Exams:  11-07-12: ct angio of chest: 1.  The appearance of the chest suggests congestive heart failure with moderate pulmonary edema and small bilateral pleural effusions, 2. Atherosclerosis, including left main and three-vessel coronaryartery disease.3.  In addition, there is extensive tortuosity and atherosclerosis of the subclavian arteries bilaterally (left greater than right),with marked narrowing of the proximal subclavian artery whichmeasures only 2-3 mm and minimal diameter.  11-07-12: ct of abdomen and pelvis: 1.  Vascular findings and measurements pertinent to potential TAVR procedure, as detailed above.  The patient does appear to have a suitable left sided pelvic arterial access in terms of vessel size (for low profile devices). However, there is extensive tortuosity of the external iliac arteries bilaterally. 2. Trace volume of ascites.  This may relate to passive hepatic congestion from underlying congestive heart failure. 3.  Small locule of gas noted nondependently within the lumen of the urinary bladder.  No Foley balloon catheter is noted at this time.  This may be iatrogenic if the patient has recently been catheterized for urinalysis.  Alternatively, this could be indicative of infection with gas forming organisms.  Clinical correlation is recommended. 4.  Extensive colonic diverticulosis without findings to suggest  acute diverticulitis at this time. 5.  Additional incidental findings, as above.  11-23-12: chest x-ray: Mild interstitial edema.  No acute abnormality is noted.  11-28-12: chest x-ray: Slight increase interstitial prominence upper lungs may reflect interstitial edema. Patchy opacity in the left mid and lower lung.  11-28-12 2D Echocardiogram   - Left ventricle: The cavity size was mildly dilated. Wall   thickness was increased in a pattern of moderate LVH.   Systolic function was normal. The estimated ejection   fraction was in the range of 55% to 60%. - Aortic valve: 23mm Sapien XT valve in good position with   low systolic gradient and persistant mild perivalvular   regurgitation - Mitral valve: Calcified annulus. - Left atrium: The atrium was severely dilated. - Atrial septum: No defect or patent foramen ovale was   identified.  11-27-12 S/P TAVR (transcatheter aortic valve replacement--Edwards Sapien XT THV (sixe 23 mm).   Assessment/Plan  Aortic stenosis- s/p AVR and tolerated procedure well. Using her incentive spirometer. Working with PT and OT.   deconditioning- has been improving with therapy sessions. Fall preventions. Slow position change encouraged.   Hypertension- bp well controlled. continue amlodipine 10 mg daily, valsartan, hydralazine, imdur and atenolol and baby aspirin  chf- recently euvolemic. Continue atenolol with furosemide, hydralazine, valsartan and imdur at current dosing without any changes. Monitor weight. Monitor bmp. Continue kcl supplement  CAD- remains chest pain free. Continue aspirin and plavix with b blocker, ARB, imdur. Not on statin. Will start her on atorvastatin 10 mg daily for mortality benefit  GERD- reflux symptoms controlled with omeprazole , continue this  Depression- continue zoloft for now  Hypokalemia- continue lasix, monitor bmp. Continue kcl supplement  CVA- bp under control. Also on aspirin and plavix. Not on any statin. Will  start her on atorvastatin 10 mg daily for now  Family/ staff Communication: reviewed care plan with patient and charge nurse  Goals of care: return home after completion of  STR   Labs/tests ordered: cbc, cmp

## 2012-12-28 ENCOUNTER — Ambulatory Visit (HOSPITAL_COMMUNITY)
Admit: 2012-12-28 | Discharge: 2012-12-28 | Disposition: A | Discharge status: Home / Self Care | Payer: Medicare Other | Source: Ambulatory Visit | Attending: Cardiovascular Disease | Admitting: Cardiovascular Disease

## 2012-12-28 ENCOUNTER — Encounter (HOSPITAL_COMMUNITY): Payer: Self-pay | Admitting: Thoracic Surgery (Cardiothoracic Vascular Surgery)

## 2012-12-28 ENCOUNTER — Ambulatory Visit (HOSPITAL_BASED_OUTPATIENT_CLINIC_OR_DEPARTMENT_OTHER)
Admit: 2012-12-28 | Discharge: 2012-12-28 | Disposition: A | Discharge status: Home / Self Care | Payer: Medicare Other | Attending: Thoracic Surgery (Cardiothoracic Vascular Surgery) | Admitting: Thoracic Surgery (Cardiothoracic Vascular Surgery)

## 2012-12-28 ENCOUNTER — Ambulatory Visit (HOSPITAL_BASED_OUTPATIENT_CLINIC_OR_DEPARTMENT_OTHER)
Admit: 2012-12-28 | Discharge: 2012-12-28 | Disposition: A | Discharge status: Home / Self Care | Payer: Medicare Other | Attending: Cardiovascular Disease | Admitting: Cardiovascular Disease

## 2012-12-28 ENCOUNTER — Encounter (HOSPITAL_COMMUNITY): Payer: Self-pay | Admitting: Cardiovascular Disease

## 2012-12-28 ENCOUNTER — Non-Acute Institutional Stay (SKILLED_NURSING_FACILITY): Payer: Medicare Other | Admitting: Nurse Practitioner

## 2012-12-28 ENCOUNTER — Ambulatory Visit (HOSPITAL_COMMUNITY)
Admission: RE | Admit: 2012-12-28 | Discharge: 2012-12-28 | Disposition: A | Discharge status: Home / Self Care | Payer: Medicare Other | Source: Ambulatory Visit | Attending: Cardiovascular Disease | Admitting: Cardiovascular Disease

## 2012-12-28 ENCOUNTER — Encounter: Payer: Self-pay | Admitting: Nurse Practitioner

## 2012-12-28 VITALS — BP 174/30 | HR 72 | Resp 19 | Ht 60.0 in | Wt 130.8 lb

## 2012-12-28 DIAGNOSIS — Z9889 Other specified postprocedural states: Secondary | ICD-10-CM | POA: Insufficient documentation

## 2012-12-28 DIAGNOSIS — R5381 Other malaise: Secondary | ICD-10-CM

## 2012-12-28 DIAGNOSIS — I359 Nonrheumatic aortic valve disorder, unspecified: Secondary | ICD-10-CM

## 2012-12-28 DIAGNOSIS — Z8673 Personal history of transient ischemic attack (TIA), and cerebral infarction without residual deficits: Secondary | ICD-10-CM

## 2012-12-28 DIAGNOSIS — I319 Disease of pericardium, unspecified: Secondary | ICD-10-CM | POA: Insufficient documentation

## 2012-12-28 DIAGNOSIS — F329 Major depressive disorder, single episode, unspecified: Secondary | ICD-10-CM

## 2012-12-28 DIAGNOSIS — I2510 Atherosclerotic heart disease of native coronary artery without angina pectoris: Secondary | ICD-10-CM

## 2012-12-28 DIAGNOSIS — Z952 Presence of prosthetic heart valve: Secondary | ICD-10-CM | POA: Insufficient documentation

## 2012-12-28 DIAGNOSIS — I059 Rheumatic mitral valve disease, unspecified: Secondary | ICD-10-CM | POA: Insufficient documentation

## 2012-12-28 DIAGNOSIS — I379 Nonrheumatic pulmonary valve disorder, unspecified: Secondary | ICD-10-CM | POA: Insufficient documentation

## 2012-12-28 DIAGNOSIS — I517 Cardiomegaly: Secondary | ICD-10-CM | POA: Insufficient documentation

## 2012-12-28 DIAGNOSIS — K219 Gastro-esophageal reflux disease without esophagitis: Secondary | ICD-10-CM

## 2012-12-28 DIAGNOSIS — I079 Rheumatic tricuspid valve disease, unspecified: Secondary | ICD-10-CM | POA: Insufficient documentation

## 2012-12-28 DIAGNOSIS — Z954 Presence of other heart-valve replacement: Secondary | ICD-10-CM

## 2012-12-28 DIAGNOSIS — I110 Hypertensive heart disease with heart failure: Secondary | ICD-10-CM

## 2012-12-28 NOTE — Progress Notes (Signed)
Patient ID: Kaylee Schmitt, female   DOB: 01/17/1924, 79 y.o.   MRN: [id number]   [facility]   MULTIDISCIPLINARY HEART VALVE CLINIC       CARDIOTHORACIC SURGERY NOTE  Referring Provider is Quintella Reichert, MD PCP is Quintella Reichert, MD   HPI:  Patient returns for routine followup today status post transcatheter aortic valve replacement via the open left transfemoral approach using a 23 mm Edwards Sapien XT transcatheter heart valve on 11/27/2012.  Her postoperative recovery has been uncomplicated. Following hospital discharge the patient has been staying at [facility] where she has enjoyed further improvement in her mobility and functional status.  She returns to the clinic today with her son. She reports no shortness of breath and comments that she feels much better than she did prior to surgery. She does complain that she still gets tired easily. She has had no pain. Her appetite is fair. She is sleeping well. Her mobility has improved. Overall she has no complaints.   Current Outpatient Prescriptions  Medication Sig Dispense Refill  . amLODipine (NORVASC) 10 MG tablet Take 1 tablet (10 mg total) by mouth every evening.  30 tablet  3  . aspirin EC 81 MG tablet Take 81 mg by mouth every morning.      Marland Kitchen atenolol (TENORMIN) 50 MG tablet Take 25 mg by mouth every morning.       Marland Kitchen atorvastatin (LIPITOR) 10 MG tablet Take 10 mg by mouth daily.      . clopidogrel (PLAVIX) 75 MG tablet Take 1 tablet (75 mg total) by mouth daily with breakfast.  30 tablet  3  . furosemide (LASIX) 40 MG tablet Take 40 mg by mouth 2 (two) times daily.       . hydrALAZINE (APRESOLINE) 50 MG tablet Take 1 tablet (50 mg total) by mouth every 8 (eight) hours.  90 tablet  3  . isosorbide mononitrate (IMDUR) 60 MG 24 hr tablet Take 1 tablet (60 mg total) by mouth daily.  30 tablet  3  . omeprazole (PRILOSEC) 20 MG capsule Take 20 mg by mouth every morning.       . potassium chloride SA (K-DUR,KLOR-CON) 20  MEQ tablet Take 1 tablet (20 mEq total) by mouth daily.  30 tablet  0  . sertraline (ZOLOFT) 100 MG tablet Take 100 mg by mouth every morning.       . valsartan (DIOVAN) 160 MG tablet Take 160 mg by mouth every morning.        No current facility-administered medications for this encounter.      Physical Exam:   BP 174/30  Pulse 72  Resp 19  Ht 5' (1.524 m)  Wt 130 lb 12.8 oz (59.33 kg)  BMI 25.54 kg/m2  SpO2 99%  General:  Well-appearing  Chest:   Clear to auscultation with symmetrical breath sounds  CV:   Irregular rhythm with soft systolic murmur  Incisions:  Left groin incision is healed nicely  Abdomen:  Soft nontender  Extremities:  Warm and well-perfused with no lower extremity edema  Diagnostic Tests:  *RADIOLOGY REPORT*  Clinical Data: Aortic stenosis status post T A V R surgery  CHEST - 2 VIEW  Comparison: 11/28/2012  Findings: Postsurgical changes in the aortic valve are again noted  and stable. Cardiac shadow is stable. The lungs are well-aerated  bilaterally. The previously seen patchy infiltrates have improved  significantly. No focal confluent infiltrate is seen. Heavy  calcification of the thoracic  aorta is noted.  IMPRESSION:  Improving infiltrates bilaterally. Some mild residual changes are  noted particularly in the left base.  Original Report Authenticated By: Alcide Clever, M.D.    Impression:  Patient appears to be doing quite well one month following transcatheter aortic valve replacement via open left transfemoral approach.    Plan:  I've encouraged patient to continue to increase her physical activity without any particular limitations at this time.  We have not made any changes in the patient's current medications. We have reminded the patient to schedule followup appointment with Dr. Mayford Knife in the near future. Typically we would recommend stopping Plavix 6 months following transcatheter aortic valve replacement. The patient will return to the  multidisciplinary heart valve clinic in one year for routine followup and echocardiogram.     Salvatore Decent. Cornelius Moras, MD 12/28/2012 4:27 PM

## 2012-12-28 NOTE — Progress Notes (Signed)
*  PRELIMINARY RESULTS* Echocardiogram 2D Echocardiogram has been performed.  Kaylee Schmitt 12/28/2012, 3:06 PM

## 2012-12-28 NOTE — Patient Instructions (Signed)
You will f/u with your cardiologist every 4-6 months.  We will see you back in August 2015 for your 1 year follow up with an echocardiogram.  Plavix will stop in 6 months.  (March 2015)

## 2013-01-04 ENCOUNTER — Encounter (HOSPITAL_COMMUNITY): Payer: Self-pay | Admitting: Cardiovascular Disease

## 2013-01-04 NOTE — Progress Notes (Signed)
MULTIDISCIPLINARY VALVE CLINIC NOTE  HPI:   79 year-old woman presenting for follow-up evaluation after undergoing left transfemoral TAVR on 11/27/2012. She had an uncomplicated postoperative course and was discharged to [facility] for rehab. She is now back home.   Overall she is doing well. Notes improvement in her breathing. Denies chest pain, shortness of breath, or edema. Has no leg swelling or palpitations. Continues to have generalized fatigue, otherwise no complaints.   Allergies  Allergen Reactions  . Adhesive [Tape] Rash    Paper tape OK.  Marland Kitchen Penicillins Itching and Rash  . Sulfa Antibiotics Itching and Rash    Past Medical History  Diagnosis Date  . Aortic stenosis   . CHF (congestive heart failure)   . CAD (coronary artery disease) 10/12/2012    Cath 2011  Calcified LM,  80% mid LAD, dominant circ without sig disease, Ostial nondominant RCA.  Severe AS  Liberte stent x 2 2011 Dr. Amil Amen    . History of CVA (cerebrovascular accident)     2003 while living in [place].   Right brain with left hemiparesis   . Gout 10/12/2012  . Hypertensive heart disease 10/12/2012  . Hyperlipidemia 10/12/2012  . Hypertension   . Chronic kidney disease   . Shortness of breath   . Stroke   . S/P TAVR (transcatheter aortic valve replacement) 11/27/2012    Stephannie Peters XT transcatheter heart valve (size 23mm) placed via open left transfemoral approach    ROS: Negative except as per HPI  BP 174/30  Pulse 72  Resp 19  Ht 5' (1.524 m)  Wt 130 lb 12.8 oz (59.33 kg)  BMI 25.54 kg/m2  SpO2 99%  PHYSICAL EXAM: Pt is alert and oriented, NAD HEENT: normal Neck: JVP - normal Lungs: CTA bilaterally CV: Regularly irregular with grade 2/6 SEM at the LSB Abd: soft, NT, Positive BS, no hepatomegaly Ext: no C/C/E, distal pulses intact and equal Skin: warm/dry no rash  EKG:  Sinus rhtyhm with atrial bigeminy, RBBB, left axis  2D Echo: Study Conclusions  - Left ventricle: The cavity size was  normal. Wall thickness was increased in a pattern of moderate LVH. Systolic function was normal. The estimated ejection fraction was in the range of 55% to 60%. Wall motion was normal; there were no regional wall motion abnormalities. - Aortic valve: A bioprosthesis was present. Mild regurgitation. Valve area: 2.45cm^2(VTI). Valve area: 2.02cm^2 (Vmax). - Mitral valve: Moderately calcified annulus. - Left atrium: The atrium was moderately dilated. - Pericardium, extracardiac: A trivial pericardial effusion was identified. Impressions:  - Mild perivalvular AI.  ASSESSMENT AND PLAN: 1. Aortic valve disease s/p TAVR - doing quite well now one month out from surgery. Normal valve function by echo with only mild AI (not appreciable on exam). Continue ASA indefinitely and plavix x 6 months. Follow-up in valve clinic at one year.  2. HTN - BP controlled on current Rx  3. PAC's/atrial bigeminy. Pt asymptomatic - EKG reviewed. On beta-blocker. Follow-up with Dr Mayford Knife as scheduled.   Tonny Bollman 01/04/2013 6:15 AM

## 2013-02-21 ENCOUNTER — Encounter (HOSPITAL_COMMUNITY): Payer: Self-pay | Admitting: *Deleted

## 2013-03-11 ENCOUNTER — Telehealth: Payer: Self-pay | Admitting: Cardiology

## 2013-03-11 NOTE — Telephone Encounter (Signed)
To Dr Mayford Knife to Make aware

## 2013-03-11 NOTE — Telephone Encounter (Signed)
New Problem  Pt's daughter called states she fell a week prior// was taken to orthopedic for the swelling in her leg// Xray was completed// nothing is broken so the daughter is concerned that it is heart related/// Made an appt for 12/3 @ 1:15pm// Please call if a sooner appt is needed//

## 2013-03-12 ENCOUNTER — Encounter: Payer: Self-pay | Admitting: Cardiology

## 2013-03-13 ENCOUNTER — Encounter (INDEPENDENT_AMBULATORY_CARE_PROVIDER_SITE_OTHER): Payer: Self-pay

## 2013-03-13 ENCOUNTER — Encounter: Payer: Self-pay | Admitting: General Surgery

## 2013-03-13 ENCOUNTER — Ambulatory Visit (INDEPENDENT_AMBULATORY_CARE_PROVIDER_SITE_OTHER): Payer: Medicare Other | Admitting: Cardiology

## 2013-03-13 ENCOUNTER — Encounter: Payer: Self-pay | Admitting: Cardiology

## 2013-03-13 VITALS — BP 162/59 | HR 61 | Ht 60.0 in | Wt 133.0 lb

## 2013-03-13 DIAGNOSIS — I2510 Atherosclerotic heart disease of native coronary artery without angina pectoris: Secondary | ICD-10-CM

## 2013-03-13 DIAGNOSIS — I350 Nonrheumatic aortic (valve) stenosis: Secondary | ICD-10-CM

## 2013-03-13 DIAGNOSIS — I10 Essential (primary) hypertension: Secondary | ICD-10-CM

## 2013-03-13 DIAGNOSIS — I359 Nonrheumatic aortic valve disorder, unspecified: Secondary | ICD-10-CM

## 2013-03-13 DIAGNOSIS — E785 Hyperlipidemia, unspecified: Secondary | ICD-10-CM

## 2013-03-13 DIAGNOSIS — Z952 Presence of prosthetic heart valve: Secondary | ICD-10-CM

## 2013-03-13 DIAGNOSIS — Z954 Presence of other heart-valve replacement: Secondary | ICD-10-CM

## 2013-03-13 DIAGNOSIS — I6529 Occlusion and stenosis of unspecified carotid artery: Secondary | ICD-10-CM | POA: Insufficient documentation

## 2013-03-13 MED ORDER — VALSARTAN 320 MG PO TABS: 320.0000 mg | ORAL_TABLET | Freq: Every morning | ORAL | Status: DC

## 2013-03-13 NOTE — Progress Notes (Signed)
[address] [place], [place]  [address] Phone: [phone] Fax:  [phone]  Date:  03/13/2013   ID:  Kaylee Schmitt, DOB 1923/05/23, MRN [id number]  PCP:  Quintella Reichert, MD  Cardiologist:  Armanda Magic, MD     History of Present Illness: Kaylee Schmitt is a 79 y.o. female with a history of severe AS s/p TAVR, ASCAD, HTN, dyslipidemia and chronic diastolic CHF who presents today.  She is doing well.  She denies any chest pain, dizziness, palpitations or syncope.Her main complaint is LE edema.  She had a fall about a week ago and a table fell on her leg.  She has chronic SOB which is about the same as since her surgery.   Wt Readings from Last 3 Encounters:  03/13/13 133 lb (60.328 kg)  12/28/12 130 lb 12.8 oz (59.33 kg)  12/28/12 130 lb 12.8 oz (59.33 kg)     Past Medical History  Diagnosis Date  . Aortic stenosis   . CAD (coronary artery disease) 10/12/2012    Cath 2011  Calcified LM,  80% mid LAD, dominant circ without sig disease, Ostial nondominant RCA.  Severe AS  Liberte stent x 2 2011 Dr. Amil Amen    . History of CVA (cerebrovascular accident)     2003 while living in [place].   Right brain with left hemiparesis   . Gout 10/12/2012  . Hypertensive heart disease 10/12/2012  . Hypertension   . Chronic kidney disease   . Shortness of breath   . Stroke   . S/P TAVR (transcatheter aortic valve replacement) 11/27/2012    Stephannie Peters XT transcatheter heart valve (size 23mm) placed via open left transfemoral approach  . Hyperlipidemia 10/12/2012    intolerant to statins  . Carotid artery occlusion     moderate bilaterally  . Chronic diastolic CHF (congestive heart failure)     Current Outpatient Prescriptions  Medication Sig Dispense Refill  . amLODipine (NORVASC) 10 MG tablet Take 1 tablet (10 mg total) by mouth every evening.  30 tablet  3  . aspirin EC 81 MG tablet Take 81 mg by mouth every morning.      Marland Kitchen atenolol (TENORMIN) 50 MG tablet Take 25 mg by mouth every  morning.       Marland Kitchen atorvastatin (LIPITOR) 10 MG tablet Take 10 mg by mouth daily.      . clopidogrel (PLAVIX) 75 MG tablet Take 1 tablet (75 mg total) by mouth daily with breakfast.  30 tablet  3  . furosemide (LASIX) 40 MG tablet Take 40 mg by mouth 2 (two) times daily.       . hydrALAZINE (APRESOLINE) 50 MG tablet Take 1 tablet (50 mg total) by mouth every 8 (eight) hours.  90 tablet  3  . isosorbide mononitrate (IMDUR) 60 MG 24 hr tablet Take 1 tablet (60 mg total) by mouth daily.  30 tablet  3  . omeprazole (PRILOSEC) 20 MG capsule Take 20 mg by mouth every morning.       . potassium chloride SA (K-DUR,KLOR-CON) 20 MEQ tablet Take 1 tablet (20 mEq total) by mouth daily.  30 tablet  0  . sertraline (ZOLOFT) 100 MG tablet Take 100 mg by mouth every morning.       . valsartan (DIOVAN) 160 MG tablet Take 160 mg by mouth every morning.        No current facility-administered medications for this visit.    Allergies:    Allergies  Allergen Reactions  . Ramipril Cough  . Adhesive [Tape] Rash    Paper tape OK.  Marland Kitchen Penicillins Itching and Rash  . Sulfa Antibiotics Itching and Rash    Social History:  The patient  reports that she has never smoked. She does not have any smokeless tobacco history on file. She reports that she drinks alcohol. She reports that she does not use illicit drugs.   Family History:  The patient's family history includes Brain cancer in her sister; CAD in her mother; COPD in her sister; CVA in her sister; Cancer - Prostate in her father.   ROS:  Please see the history of present illness.      All other systems reviewed and negative.   PHYSICAL EXAM: VS:  Ht 5' (1.524 m)  Wt 133 lb (60.328 kg)  BMI 25.97 kg/m2 Well nourished, well developed, in no acute distress HEENT: normal Neck: no JVD Cardiac:  normal S1, S2; RRR; 2/6 SM at RUSB with radiation to the LLSB Lungs:  clear to auscultation bilaterally, no wheezing, rhonchi or rales Abd: soft, nontender, no  hepatomegaly Ext: 3+ edema on LLE with ecchymotic area of anterior tibia and 1+ edema RLE Skin: warm and dry Neuro:  CNs 2-12 intact, no focal abnormalities noted       ASSESSMENT AND PLAN:  1. Severe AS s/p TAVR  - I will get a baseline echo post TAVR 2. Chronic SOB most likely secondary to chronic diastolic CHF and deconditioning - she is having some LE edema but SOB is at baseline.  Her LE edema is worse in the left leg where she injured it a week ago and she was seen by her orthopedist and had a bad bruise.  She does have some mild LE edema in the RLE so will increase her Lasix to 40mg  twice daily for 3 days then back to 1 tablet daily. 3. HTN - mildly elevated BP today  - continue amlodipine/atenolol/hydralazine/valsartan  - will increase Valsartan to 320mg  daily and I will have her assisted living check her BP daily for a week and call and get a BMET in 1 week 4. ASCAD - no angina - continue ASA/Plavix/Diovan 5. Dyslipidemia 6. HTN  Followup with me in 6 months   Signed, Armanda Magic, MD 03/13/2013 1:29 PM

## 2013-03-13 NOTE — Patient Instructions (Addendum)
Your physician has recommended you make the following change in your medication: Increase Lasix to 40 MG Twice a day for Three days. Then go back to 40 MG daily. Increase Diovan to 320 MG daily  Your physician has requested that you have an echocardiogram. Echocardiography is a painless test that uses sound waves to create images of your heart. It provides your doctor with information about the size and shape of your heart and how well your heart's chambers and valves are working. This procedure takes approximately one hour. There are no restrictions for this procedure.  Check your BP daily for one week and call us with the results  Your physician recommends that you schedule a follow-up appointment in: 3 Weeks with Dr Mayford Knife

## 2013-03-25 ENCOUNTER — Emergency Department (HOSPITAL_COMMUNITY)
Admission: EM | Admit: 2013-03-25 | Discharge: 2013-03-25 | Disposition: A | Discharge status: Other Acute Inpatient Hospital | Payer: Medicare Other | Attending: Emergency Medicine | Admitting: Emergency Medicine

## 2013-03-25 ENCOUNTER — Emergency Department (HOSPITAL_COMMUNITY): Payer: Medicare Other

## 2013-03-25 ENCOUNTER — Encounter (HOSPITAL_COMMUNITY): Payer: Self-pay | Admitting: Emergency Medicine

## 2013-03-25 DIAGNOSIS — N189 Chronic kidney disease, unspecified: Secondary | ICD-10-CM | POA: Insufficient documentation

## 2013-03-25 DIAGNOSIS — IMO0002 Reserved for concepts with insufficient information to code with codable children: Secondary | ICD-10-CM | POA: Insufficient documentation

## 2013-03-25 DIAGNOSIS — H9190 Unspecified hearing loss, unspecified ear: Secondary | ICD-10-CM | POA: Insufficient documentation

## 2013-03-25 DIAGNOSIS — Y9210 Unspecified residential institution as the place of occurrence of the external cause: Secondary | ICD-10-CM | POA: Insufficient documentation

## 2013-03-25 DIAGNOSIS — Z7982 Long term (current) use of aspirin: Secondary | ICD-10-CM | POA: Insufficient documentation

## 2013-03-25 DIAGNOSIS — Z8673 Personal history of transient ischemic attack (TIA), and cerebral infarction without residual deficits: Secondary | ICD-10-CM | POA: Insufficient documentation

## 2013-03-25 DIAGNOSIS — M109 Gout, unspecified: Secondary | ICD-10-CM | POA: Insufficient documentation

## 2013-03-25 DIAGNOSIS — Z9861 Coronary angioplasty status: Secondary | ICD-10-CM | POA: Insufficient documentation

## 2013-03-25 DIAGNOSIS — I6529 Occlusion and stenosis of unspecified carotid artery: Secondary | ICD-10-CM | POA: Insufficient documentation

## 2013-03-25 DIAGNOSIS — I359 Nonrheumatic aortic valve disorder, unspecified: Secondary | ICD-10-CM | POA: Insufficient documentation

## 2013-03-25 DIAGNOSIS — M546 Pain in thoracic spine: Secondary | ICD-10-CM | POA: Insufficient documentation

## 2013-03-25 DIAGNOSIS — I129 Hypertensive chronic kidney disease with stage 1 through stage 4 chronic kidney disease, or unspecified chronic kidney disease: Secondary | ICD-10-CM | POA: Insufficient documentation

## 2013-03-25 DIAGNOSIS — Y9389 Activity, other specified: Secondary | ICD-10-CM | POA: Insufficient documentation

## 2013-03-25 DIAGNOSIS — R109 Unspecified abdominal pain: Secondary | ICD-10-CM | POA: Insufficient documentation

## 2013-03-25 DIAGNOSIS — R296 Repeated falls: Secondary | ICD-10-CM | POA: Insufficient documentation

## 2013-03-25 DIAGNOSIS — R51 Headache: Secondary | ICD-10-CM | POA: Insufficient documentation

## 2013-03-25 DIAGNOSIS — Z880 Allergy status to penicillin: Secondary | ICD-10-CM | POA: Insufficient documentation

## 2013-03-25 DIAGNOSIS — Z954 Presence of other heart-valve replacement: Secondary | ICD-10-CM | POA: Insufficient documentation

## 2013-03-25 DIAGNOSIS — W19XXXA Unspecified fall, initial encounter: Secondary | ICD-10-CM

## 2013-03-25 DIAGNOSIS — Z79899 Other long term (current) drug therapy: Secondary | ICD-10-CM | POA: Insufficient documentation

## 2013-03-25 DIAGNOSIS — G8929 Other chronic pain: Secondary | ICD-10-CM | POA: Insufficient documentation

## 2013-03-25 DIAGNOSIS — I5032 Chronic diastolic (congestive) heart failure: Secondary | ICD-10-CM | POA: Insufficient documentation

## 2013-03-25 DIAGNOSIS — T148XXA Other injury of unspecified body region, initial encounter: Secondary | ICD-10-CM

## 2013-03-25 MED ORDER — TETANUS-DIPHTHERIA TOXOIDS TD 5-2 LFU IM INJ
0.5000 mL | INJECTION | Freq: Once | INTRAMUSCULAR | Status: AC
  Administered 2013-03-25: 0.5 mL via INTRAMUSCULAR
  Filled 2013-03-25: qty 0.5

## 2013-03-25 NOTE — ED Provider Notes (Signed)
Medical screening examination/treatment/procedure(s) were conducted as a shared visit with non-physician practitioner(s) and myself.  I personally evaluated the patient during the encounter.  EKG Interpretation   None       Patient [place] [facility]. She was picking up a card table when she lost her balance and fell. She reports she struck the right side of her head on the floor. She has skin tears to bilateral arms. She had no loss of consciousness. She is currently oriented x3, neurologically intact. She does have chronic abdominal pain on exam and states it has been present for over 2 years and is unchanged. Patient does have mild tenderness to palpation over her upper thoracic spine without step-off or deformity. We'll obtain imaging of her head, cervical spine and thoracic spine. Will update patient's tetanus vaccination.  Kaylee Maw Shany Marinez, DO 03/25/13 [phone]

## 2013-03-25 NOTE — ED Provider Notes (Signed)
CSN: [id number]     Arrival date & time 03/25/13  1214 History   First MD Initiated Contact with Patient 03/25/13 1226     Chief Complaint  Patient presents with  . Fall   (Consider location/radiation/quality/duration/timing/severity/associated sxs/prior Treatment) HPI Comments: Patient is an 79 year old female with history of severe aortic stenosis, coronary artery disease, CVA, hypertension, CKD, hyperlipidemia who presents today after a fall. She reports she was putting a card table away after deciding that she no longer wanted to do a puzzle when she lost her balance. She hit her arms and her head. She has skin tears on both of her arms. She reports that she is in no pain, but has discomfort on the right side of her head. She is able to give me a clear coherent history and is aware to place and time. When I ask her name she replies "What's my name? Pudingtang!". She complains of abdominal pain, but states this is nothing new and is the same pain she always has.   Patient is a 79 y.o. female presenting with fall. The history is provided by the patient. No language interpreter was used.  Fall Associated symptoms include abdominal pain and headaches. Pertinent negatives include no chest pain, chills, fever, neck pain, numbness or weakness.    Past Medical History  Diagnosis Date  . Aortic stenosis   . CAD (coronary artery disease) 10/12/2012    Cath 2011  Calcified LM,  80% mid LAD, dominant circ without sig disease, Ostial nondominant RCA.  Severe AS  Liberte stent x 2 2011 Dr. Amil Amen    . History of CVA (cerebrovascular accident)     2003 while living in [place].   Right brain with left hemiparesis   . Gout 10/12/2012  . Hypertensive heart disease 10/12/2012  . Hypertension   . Chronic kidney disease   . Shortness of breath   . Stroke   . S/P TAVR (transcatheter aortic valve replacement) 11/27/2012    Stephannie Peters XT transcatheter heart valve (size 23mm) placed via open left transfemoral  approach  . Hyperlipidemia 10/12/2012    intolerant to statins  . Carotid artery occlusion     moderate bilaterally  . Chronic diastolic CHF (congestive heart failure)    Past Surgical History  Procedure Laterality Date  . Coronary stent placement    . Cholecystectomy    . Appendectomy    . Cataract extraction    . Cardiac catheterization    . Transcatheter aortic valve replacement, transfemoral N/A 11/27/2012    Procedure: TRANSCATHETER AORTIC VALVE REPLACEMENT, TRANSFEMORAL;  Surgeon: Tonny Bollman, MD;  Location: [facility] OR;  Service: Cardiovascular;  Laterality: N/A;  . Intraoperative transesophageal echocardiogram N/A 11/27/2012    Procedure: INTRAOPERATIVE TRANSESOPHAGEAL ECHOCARDIOGRAM;  Surgeon: Tonny Bollman, MD;  Location: [facility] OR;  Service: Cardiovascular;  Laterality: N/A;   Family History  Problem Relation Age of Onset  . Cancer - Prostate Father   . CAD Mother   . CVA Sister   . Brain cancer Sister   . COPD Sister    History  Substance Use Topics  . Smoking status: Never Smoker   . Smokeless tobacco: Not on file  . Alcohol Use: Yes     Comment: occasionally   OB History   Grav Para Term Preterm Abortions TAB SAB Ect Mult Living                 Review of Systems  Constitutional: Negative for fever and chills.  Eyes: Negative  for photophobia and visual disturbance.  Respiratory: Negative for shortness of breath.   Cardiovascular: Negative for chest pain.  Gastrointestinal: Positive for abdominal pain.  Musculoskeletal: Positive for back pain. Negative for neck pain and neck stiffness.  Neurological: Positive for headaches. Negative for weakness, light-headedness and numbness.  All other systems reviewed and are negative.    Allergies  Ramipril; Adhesive; Penicillins; and Sulfa antibiotics  Home Medications   Current Outpatient Rx  Name  Route  Sig  Dispense  Refill  . amLODipine (NORVASC) 10 MG tablet   Oral   Take 1 tablet (10 mg total) by mouth every  evening.   30 tablet   3   . aspirin EC 81 MG tablet   Oral   Take 81 mg by mouth every morning.         Marland Kitchen atenolol (TENORMIN) 50 MG tablet   Oral   Take 25 mg by mouth every morning.          Marland Kitchen atorvastatin (LIPITOR) 10 MG tablet   Oral   Take 10 mg by mouth daily.         . clopidogrel (PLAVIX) 75 MG tablet   Oral   Take 1 tablet (75 mg total) by mouth daily with breakfast.   30 tablet   3   . furosemide (LASIX) 40 MG tablet   Oral   Take 40 mg by mouth daily.          . hydrALAZINE (APRESOLINE) 50 MG tablet   Oral   Take 1 tablet (50 mg total) by mouth every 8 (eight) hours.   90 tablet   3   . isosorbide mononitrate (IMDUR) 60 MG 24 hr tablet   Oral   Take 1 tablet (60 mg total) by mouth daily.   30 tablet   3   . omeprazole (PRILOSEC) 20 MG capsule   Oral   Take 20 mg by mouth every morning.          . potassium chloride SA (K-DUR,KLOR-CON) 20 MEQ tablet   Oral   Take 1 tablet (20 mEq total) by mouth daily.   30 tablet   0   . sertraline (ZOLOFT) 100 MG tablet   Oral   Take 100 mg by mouth every morning.          . valsartan (DIOVAN) 320 MG tablet   Oral   Take 1 tablet (320 mg total) by mouth every morning.   30 tablet   11    BP 135/72  Pulse 61  Temp(Src) 98.1 F (36.7 C) (Oral)  SpO2 97% Physical Exam  Nursing note and vitals reviewed. Constitutional: She is oriented to person, place, and time. She appears well-developed and well-nourished. No distress.  HENT:  Head: Normocephalic and atraumatic.  Right Ear: External ear normal.  Left Ear: External ear normal.  Nose: Nose normal.  Mouth/Throat: Oropharynx is clear and moist. Mucous membranes are dry.  Pt is hard of hearing  Eyes: Conjunctivae and EOM are normal. Pupils are equal, round, and reactive to light.  Neck: Trachea normal, normal range of motion and phonation normal. No spinous process tenderness and no muscular tenderness present.  Cardiovascular: Normal rate,  regular rhythm and normal heart sounds.   Pulmonary/Chest: Effort normal and breath sounds normal. No stridor. No respiratory distress. She has no wheezes. She has no rales.  Abdominal: Soft. She exhibits no distension. There is generalized tenderness. There is no rigidity, no rebound and no guarding.  Musculoskeletal: Normal range of motion.  Neurological: She is alert and oriented to person, place, and time. She has normal strength. Coordination normal.  Strength 5/5 in all extremities. No focal deficit.   Skin: Skin is warm and dry. She is not diaphoretic. No erythema.  Abrasions on distal upper arms bilaterally.   Psychiatric: She has a normal mood and affect. Her behavior is normal.    ED Course  Procedures (including critical care time) Labs Review Labs Reviewed - No data to display Imaging Review Dg Thoracic Spine 2 View  03/25/2013   CLINICAL DATA:  Back pain.  EXAM: THORACIC SPINE - 2 VIEW  COMPARISON:  Chest x-ray 12/18/2012.  CT chest 11/06/2012.  FINDINGS: Slight superior endplate depression at T8 is chronic. No acute compression deformities are evident. Osseous demineralization is noted. Proximal thoracic aortic stent. Vascular calcification.  IMPRESSION: No acute thoracic compression fracture is seen.   Electronically Signed   By: Davonna Belling M.D.   On: 03/25/2013 13:51   Ct Head Wo Contrast  03/25/2013   CLINICAL DATA:  Trauma with neck pain.  Fall.  EXAM: CT HEAD WITHOUT CONTRAST  CT CERVICAL SPINE WITHOUT CONTRAST  TECHNIQUE: Multidetector CT imaging of the head and cervical spine was performed following the standard protocol without intravenous contrast. Multiplanar CT image reconstructions of the cervical spine were also generated.  COMPARISON:  CT head without contrast 05/09/2011  FINDINGS: CT HEAD FINDINGS  A remote infarct is noted within the right basilar ganglia and coronal radiata. Atrophy and white matter disease is stable. No acute cortical infarct, hemorrhage, or  mass lesion is present. The ventricles are proportionate to the degree of atrophy. No significant extra-axial fluid collection is present. Atherosclerotic calcifications are present in the cavernous carotid arteries in at the dural margin of the vertebral arteries bilaterally. The paranasal sinuses and mastoid air cells are clear. The osseous skull is intact.  CT CERVICAL SPINE FINDINGS  The cervical spine is in shin skullbase through T2. Vertebral body heights and alignment are maintained. Slight degenerative anterolisthesis is present at C5-6 and at C6-7. No acute fracture or traumatic subluxation is evident. Degenerative changes are present at C1-2. Atherosclerotic calcifications are present at the dural margin of the vertebral arteries in at both carotid bifurcations. A left thyroid nodule measures 1.5 x 1.1 cm. No significant adenopathy is present. Dense atherosclerotic calcifications are present in the proximal great vessels.  Scattered ground-glass attenuation at the lung apices represents either edema or atelectasis.  IMPRESSION: 1. Atrophy and diffuse white matter disease likely reflects the sequela of chronic microvascular ischemia. 2. Remote infarct of the right basal ganglion coronal radiata. 3. No acute intracranial abnormality. 4. Extensive atherosclerotic disease of the head and neck as described. 5. Slight degenerative anterolisthesis at C5-6 and C6-7. 6. No acute fracture or traumatic subluxation in the cervical spine. 7. 1.5 cm left thyroid nodule. Consider further evaluation with thyroid ultrasound. If patient is clinically hyperthyroid, consider nuclear medicine thyroid uptake and scan.   Electronically Signed   By: Gennette Pac M.D.   On: 03/25/2013 13:57   Ct Cervical Spine Wo Contrast  03/25/2013   CLINICAL DATA:  Trauma with neck pain.  Fall.  EXAM: CT HEAD WITHOUT CONTRAST  CT CERVICAL SPINE WITHOUT CONTRAST  TECHNIQUE: Multidetector CT imaging of the head and cervical spine was  performed following the standard protocol without intravenous contrast. Multiplanar CT image reconstructions of the cervical spine were also generated.  COMPARISON:  CT head without contrast  05/09/2011  FINDINGS: CT HEAD FINDINGS  A remote infarct is noted within the right basilar ganglia and coronal radiata. Atrophy and white matter disease is stable. No acute cortical infarct, hemorrhage, or mass lesion is present. The ventricles are proportionate to the degree of atrophy. No significant extra-axial fluid collection is present. Atherosclerotic calcifications are present in the cavernous carotid arteries in at the dural margin of the vertebral arteries bilaterally. The paranasal sinuses and mastoid air cells are clear. The osseous skull is intact.  CT CERVICAL SPINE FINDINGS  The cervical spine is in shin skullbase through T2. Vertebral body heights and alignment are maintained. Slight degenerative anterolisthesis is present at C5-6 and at C6-7. No acute fracture or traumatic subluxation is evident. Degenerative changes are present at C1-2. Atherosclerotic calcifications are present at the dural margin of the vertebral arteries in at both carotid bifurcations. A left thyroid nodule measures 1.5 x 1.1 cm. No significant adenopathy is present. Dense atherosclerotic calcifications are present in the proximal great vessels.  Scattered ground-glass attenuation at the lung apices represents either edema or atelectasis.  IMPRESSION: 1. Atrophy and diffuse white matter disease likely reflects the sequela of chronic microvascular ischemia. 2. Remote infarct of the right basal ganglion coronal radiata. 3. No acute intracranial abnormality. 4. Extensive atherosclerotic disease of the head and neck as described. 5. Slight degenerative anterolisthesis at C5-6 and C6-7. 6. No acute fracture or traumatic subluxation in the cervical spine. 7. 1.5 cm left thyroid nodule. Consider further evaluation with thyroid ultrasound. If  patient is clinically hyperthyroid, consider nuclear medicine thyroid uptake and scan.   Electronically Signed   By: Gennette Pac M.D.   On: 03/25/2013 13:57    EKG Interpretation   None       MDM   1. Fall, initial encounter   2. Abrasion    Patient presents after a mechanical fall from [employer]. She has abrasions on her distal upper arm. Tetanus updated. No complaints at this time. All imaging is negative for acute pathology. Vital signs are stable. Patient is ready to be discharged home. Dr. Elesa Massed evaluated this patient and agrees with plan. Patient / Family / Caregiver informed of clinical course, understand medical decision-making process, and agree with plan.     Mora Bellman, PA-C 03/25/13 1420

## 2013-03-25 NOTE — ED Notes (Signed)
Pt from [employer].  Pt was folding up her card table, lost her balance, and fell.  Pt did not want to be transported, but it is facility protocol for pt to be evaluated.  Pt has small contusion to R side of head and skin tear to bilateral elbows.  Pt is alert and oriented x 4.  Pt states she has a "little pain to head and elbows".

## 2013-03-25 NOTE — Progress Notes (Signed)
Patient ID: Kaylee Schmitt, female   DOB: [birth year], 79 y.o.   MRN: [id number]  Malvin Johns, Rm 301  Chief Complaint  Patient presents with  . Discharge Note   Allergies  Allergen Reactions  . Ramipril Cough  . Adhesive [Tape] Rash    Paper tape OK.  Marland Kitchen Penicillins Itching and Rash  . Sulfa Antibiotics Itching and Rash   Past Medical History  Diagnosis Date  . Aortic stenosis   . CAD (coronary artery disease) 10/12/2012    Cath 2011  Calcified LM,  80% mid LAD, dominant circ without sig disease, Ostial nondominant RCA.  Severe AS  Liberte stent x 2 2011 Dr. Amil Amen    . History of CVA (cerebrovascular accident)     2003 while living in [place].   Right brain with left hemiparesis   . Gout 10/12/2012  . Hypertensive heart disease 10/12/2012  . Hypertension   . Chronic kidney disease   . Shortness of breath   . Stroke   . S/P TAVR (transcatheter aortic valve replacement) 11/27/2012    Stephannie Peters XT transcatheter heart valve (size 23mm) placed via open left transfemoral approach  . Hyperlipidemia 10/12/2012    intolerant to statins  . Carotid artery occlusion     moderate bilaterally  . Chronic diastolic CHF (congestive heart failure)    Past Surgical History  Procedure Laterality Date  . Coronary stent placement    . Cholecystectomy    . Appendectomy    . Cataract extraction    . Cardiac catheterization    . Transcatheter aortic valve replacement, transfemoral N/A 11/27/2012    Procedure: TRANSCATHETER AORTIC VALVE REPLACEMENT, TRANSFEMORAL;  Surgeon: Tonny Bollman, MD;  Location: [facility] OR;  Service: Cardiovascular;  Laterality: N/A;  . Intraoperative transesophageal echocardiogram N/A 11/27/2012    Procedure: INTRAOPERATIVE TRANSESOPHAGEAL ECHOCARDIOGRAM;  Surgeon: Tonny Bollman, MD;  Location: [facility] OR;  Service: Cardiovascular;  Laterality: N/A;   Current Outpatient Prescriptions on File Prior to Visit  Medication Sig Dispense Refill  . amLODipine (NORVASC) 10 MG tablet Take  1 tablet (10 mg total) by mouth every evening.  30 tablet  3  . aspirin EC 81 MG tablet Take 81 mg by mouth every morning.      Marland Kitchen atenolol (TENORMIN) 50 MG tablet Take 25 mg by mouth every morning.       Marland Kitchen atorvastatin (LIPITOR) 10 MG tablet Take 10 mg by mouth daily.      . clopidogrel (PLAVIX) 75 MG tablet Take 1 tablet (75 mg total) by mouth daily with breakfast.  30 tablet  3  . furosemide (LASIX) 40 MG tablet Take 40 mg by mouth daily.       . hydrALAZINE (APRESOLINE) 50 MG tablet Take 1 tablet (50 mg total) by mouth every 8 (eight) hours.  90 tablet  3  . isosorbide mononitrate (IMDUR) 60 MG 24 hr tablet Take 1 tablet (60 mg total) by mouth daily.  30 tablet  3  . omeprazole (PRILOSEC) 20 MG capsule Take 20 mg by mouth every morning.       . potassium chloride SA (K-DUR,KLOR-CON) 20 MEQ tablet Take 1 tablet (20 mEq total) by mouth daily.  30 tablet  0  . sertraline (ZOLOFT) 100 MG tablet Take 100 mg by mouth every morning.        No current facility-administered medications on file prior to visit.   HPI:  Patient here for short-term rehabilitation related to a transcatheter aortic valve  replacement done on 11/27/2012  Review of Systems  Constitutional: Negative for fever and chills.  HENT: Negative for ear discharge, ear pain and nosebleeds.   Eyes: Positive for blurred vision. Negative for discharge and redness.       Patient has diminished vision in the right eye related to her macular degeneration.  Respiratory: Negative for hemoptysis, sputum production and shortness of breath.   Cardiovascular: Positive for leg swelling. Negative for chest pain.       Patient does have a history of lower extremity edema.  Gastrointestinal: Negative for vomiting, diarrhea and blood in stool.  Genitourinary: Negative for hematuria and flank pain.  Musculoskeletal: Negative for falls.  Skin: Negative for itching and rash.  Endo/Heme/Allergies: Does not bruise/bleed easily.    Psychiatric/Behavioral: Negative for suicidal ideas, hallucinations and substance abuse.   Labs 11/30/2012 Sodium 137 Potassium 3.7 Chloride 101 CO2 27 Glucose 83 BUN 19 Creatinine 1.01 Calcium 9  11/29/2012 WBC 10.7 Hemoglobin 11 Hematocrit 34.7 MCV 85.9 Platelets 109  11/22/2012 AST 14 ALT 6 Alkaline phosphatase 72 Protein 5.9 Albumin 3.6  BP 142/68  Pulse 78  Resp 20  SpO2 96%  Physical Exam  Constitutional: She is oriented to person, place, and time. She appears well-developed and well-nourished.  HENT:  Head: Normocephalic and atraumatic.  Right Ear: External ear normal.  Left Ear: External ear normal.  Nose: Nose normal.  Mouth/Throat: Oropharynx is clear and moist.  Dentures in place  Eyes: Conjunctivae and EOM are normal. Pupils are equal, round, and reactive to light. Right eye exhibits no discharge. Left eye exhibits no discharge. No scleral icterus.  Some possible abnormalities on the right optic disc, likely consistent with the patient's history of macular degeneration.  Neck: No thyromegaly present.  Question presence of carotid bruits.  Cardiovascular: Normal rate and intact distal pulses.   Apical pulse is intermittently irregular  Palpable pedal dorsalis pulses   Pulmonary/Chest: Effort normal and breath sounds normal. No respiratory distress. She has no wheezes. She has no rales.  Abdominal: Soft. Bowel sounds are normal. There is no tenderness.  Musculoskeletal: She exhibits edema.  Approximately 1-2+ edema at the medial ankles edema in the well below the knees.  Lymphadenopathy:    She has no cervical adenopathy.  Neurological: She is alert and oriented to person, place, and time.  Skin: Skin is warm and dry. No rash noted. No erythema.  Psychiatric: She has a normal mood and affect. Her behavior is normal.   Impression/plan Patient does have a history of hyperlipidemia, status post CVA,  And moderate carotid artery disease. Certainly  entirely appropriate to continue her on aspirin 81 mg once a day and Lipitor 10 mg at bedtime.  The patient was unable to tolerate a higher dose of Lipitor and she has had intolerance to statins. She will also continue on her Plavix 75 mg each day. Related to her congestive heart failure and likely coronary artery disease, certainly continue the Imdur extended release at 60 mg per day along with the hydralazine at 50 mg 3 times a day We'll also continue the Lasix at 40 mg per day and potassium at 20 mEq per day. She will be followed by her cardiologist and primary care provider and they will continue to monitor her lab values.  for her hypertension we'll continue the Norvasc at 10 mg, the atenolol at 25 mg a day, We'll continue the Prilosec 20 mg once a day for GERD. For depression, we'll certainly continue the Zoloft  at 100 mg daily as the patient is tolerating it well. Related to deconditioning, patient will receive in-home physical/occupational therapy.

## 2013-03-25 NOTE — Progress Notes (Signed)
Subjective:     Patient ID: Kaylee Schmitt, female   DOB: May 08, 1923, 79 y.o.   MRN: [id number]  HPI   Review of Systems     Objective:   Physical Exam     Assessment:         Plan:          This encounter was created in error - please disregard. This encounter was created in error - please disregard. This encounter was created in error - please disregard. This encounter was created in error - please disregard. This encounter was created in error - please disregard. This encounter was created in error - please disregard. This encounter was created in error - please disregard. This encounter was created in error - please disregard. This encounter was created in error - please disregard. This encounter was created in error - please disregard. This encounter was created in error - please disregard. This encounter was created in error - please disregard. This encounter was created in error - please disregard. This encounter was created in error - please disregard.

## 2013-03-25 NOTE — ED Notes (Signed)
Spoke with pt's daughter, Barkley Bruns.  Advised that pt is being d/c'd and we will handle her transportation.

## 2013-03-28 ENCOUNTER — Encounter: Payer: Self-pay | Admitting: Cardiology

## 2013-03-28 ENCOUNTER — Other Ambulatory Visit: Payer: Self-pay

## 2013-03-28 ENCOUNTER — Ambulatory Visit (HOSPITAL_COMMUNITY): Payer: Medicare Other | Attending: Cardiology | Admitting: Radiology

## 2013-03-28 DIAGNOSIS — Z952 Presence of prosthetic heart valve: Secondary | ICD-10-CM | POA: Insufficient documentation

## 2013-03-28 DIAGNOSIS — R609 Edema, unspecified: Secondary | ICD-10-CM | POA: Insufficient documentation

## 2013-03-28 DIAGNOSIS — R011 Cardiac murmur, unspecified: Secondary | ICD-10-CM

## 2013-03-28 DIAGNOSIS — I10 Essential (primary) hypertension: Secondary | ICD-10-CM | POA: Insufficient documentation

## 2013-03-28 DIAGNOSIS — I079 Rheumatic tricuspid valve disease, unspecified: Secondary | ICD-10-CM | POA: Insufficient documentation

## 2013-03-28 DIAGNOSIS — I350 Nonrheumatic aortic (valve) stenosis: Secondary | ICD-10-CM

## 2013-03-28 DIAGNOSIS — I509 Heart failure, unspecified: Secondary | ICD-10-CM | POA: Insufficient documentation

## 2013-03-28 DIAGNOSIS — I359 Nonrheumatic aortic valve disorder, unspecified: Secondary | ICD-10-CM | POA: Insufficient documentation

## 2013-03-28 DIAGNOSIS — E785 Hyperlipidemia, unspecified: Secondary | ICD-10-CM | POA: Insufficient documentation

## 2013-03-28 DIAGNOSIS — R0602 Shortness of breath: Secondary | ICD-10-CM | POA: Insufficient documentation

## 2013-03-28 NOTE — Progress Notes (Signed)
Echocardiogram performed.  

## 2013-04-02 ENCOUNTER — Encounter: Payer: Self-pay | Admitting: Cardiology

## 2013-04-02 ENCOUNTER — Telehealth: Payer: Self-pay | Admitting: General Surgery

## 2013-04-02 ENCOUNTER — Ambulatory Visit (INDEPENDENT_AMBULATORY_CARE_PROVIDER_SITE_OTHER): Payer: Medicare Other | Admitting: Cardiology

## 2013-04-02 VITALS — BP 132/48 | HR 60 | Wt 122.0 lb

## 2013-04-02 DIAGNOSIS — Z952 Presence of prosthetic heart valve: Secondary | ICD-10-CM

## 2013-04-02 DIAGNOSIS — E785 Hyperlipidemia, unspecified: Secondary | ICD-10-CM

## 2013-04-02 DIAGNOSIS — I5032 Chronic diastolic (congestive) heart failure: Secondary | ICD-10-CM | POA: Insufficient documentation

## 2013-04-02 DIAGNOSIS — Z954 Presence of other heart-valve replacement: Secondary | ICD-10-CM

## 2013-04-02 DIAGNOSIS — I350 Nonrheumatic aortic (valve) stenosis: Secondary | ICD-10-CM

## 2013-04-02 DIAGNOSIS — I10 Essential (primary) hypertension: Secondary | ICD-10-CM

## 2013-04-02 DIAGNOSIS — R112 Nausea with vomiting, unspecified: Secondary | ICD-10-CM

## 2013-04-02 DIAGNOSIS — I359 Nonrheumatic aortic valve disorder, unspecified: Secondary | ICD-10-CM

## 2013-04-02 DIAGNOSIS — I2510 Atherosclerotic heart disease of native coronary artery without angina pectoris: Secondary | ICD-10-CM

## 2013-04-02 DIAGNOSIS — I509 Heart failure, unspecified: Secondary | ICD-10-CM

## 2013-04-02 LAB — BASIC METABOLIC PANEL
BUN: 19 mg/dL (ref 6–23)
Chloride: 99 mEq/L (ref 96–112)
Creatinine, Ser: 1 mg/dL (ref 0.4–1.2)
GFR: 58.34 mL/min — ABNORMAL LOW (ref >60.00)
Potassium: 3.4 mEq/L — ABNORMAL LOW (ref 3.5–5.1)
Sodium: 138 mEq/L (ref 135–145)

## 2013-04-02 LAB — CBC WITH DIFFERENTIAL/PLATELET
Basophils Relative: 0.3 % (ref 0.0–3.0)
Eosinophils Absolute: 0.1 10*3/uL (ref 0.0–0.7)
Eosinophils Relative: 1.4 % (ref 0.0–5.0)
HCT: 33.6 % — ABNORMAL LOW (ref 36.0–46.0)
Lymphocytes Relative: 9.2 % — ABNORMAL LOW (ref 12.0–46.0)
Lymphs Abs: 1 10*3/uL (ref 0.7–4.0)
MCV: 85.8 fl (ref 78.0–100.0)
Monocytes Absolute: 0.7 10*3/uL (ref 0.1–1.0)
Neutro Abs: 8.5 10*3/uL — ABNORMAL HIGH (ref 1.4–7.7)
Platelets: 182 10*3/uL (ref 150.0–400.0)
RBC: 3.92 Mil/uL (ref 3.87–5.11)
RDW: 16.4 % — ABNORMAL HIGH (ref 11.5–14.6)
WBC: 10.3 10*3/uL (ref 4.5–10.5)

## 2013-04-02 MED ORDER — POTASSIUM CHLORIDE CRYS ER 20 MEQ PO TBCR: 40.0000 meq | EXTENDED_RELEASE_TABLET | Freq: Two times a day (BID) | ORAL | Status: DC

## 2013-04-02 NOTE — Telephone Encounter (Signed)
Message copied by Nita Sells on Tue Apr 02, 2013  4:54 PM ------      Message from: Armanda Magic R      Created: Tue Apr 02, 2013  4:01 PM       Increase potassium to 2 tablets daily and recheck BMET in 1 week ------

## 2013-04-02 NOTE — Patient Instructions (Signed)
Your physician recommends that you continue on your current medications as directed. Please refer to the Current Medication list given to you today.  Your physician recommends that you go to the lab today for a BMET and CBC   Your physician recommends that you schedule a follow-up appointment in: 3 months with Dr Mayford Knife

## 2013-04-02 NOTE — Telephone Encounter (Signed)
Pt chose to go to urgent care upon leaving out office today.

## 2013-04-02 NOTE — Telephone Encounter (Signed)
New problem   CMA has called to see if patient could be seen today .    Only One MD that's accepting patient  . She has left for the day.

## 2013-04-02 NOTE — Progress Notes (Signed)
[address] [place], [place]  [address] Phone: [phone] Fax:  [phone]  Date:  04/02/2013   ID:  SHARIN ALTIDOR, DOB January 07, 1924, MRN [id number]  PCP:  Quintella Reichert, MD  Cardiologist:  Armanda Magic, MD     History of Present Illness: Kaylee Schmitt is a 79 y.o. female with a history of severe AS s/p TAVR, ASCAD, HTN, dyslipidemia and chronic diastolic CHF who presents today. She is doing well. She denies any chest pain, dizziness, palpitations or syncope.  She saw me recently and her main complaint was LE edema.She has chronic SOB which is about the same as since her TAVR.   An echo was done which showed normal LVF and stable AVR.  Her lasix was increased for 3 days and she presents back today.  Her BP was also elevated and her valsartan was increased.  She has had nausea and vomiting and diarrhea for the past few weeks.  She denies any nausea or fever and chills.  She says that she has not been able to eat anything for the past 3 weeks.  She has been having abdominal pain as well.  She says that ever since her TAVR she has been nauseated.      Wt Readings from Last 3 Encounters:  04/02/13 122 lb (55.339 kg)  03/13/13 133 lb (60.328 kg)  12/13/12 124 lb 12.8 oz (56.609 kg)     Past Medical History  Diagnosis Date  . Aortic stenosis   . CAD (coronary artery disease) 10/12/2012    Cath 2011  Calcified LM,  80% mid LAD, dominant circ without sig disease, Ostial nondominant RCA.  Severe AS  Liberte stent x 2 2011 Dr. Amil Amen    . History of CVA (cerebrovascular accident)     2003 while living in [place].   Right brain with left hemiparesis   . Gout 10/12/2012  . Hypertensive heart disease 10/12/2012  . Hypertension   . Chronic kidney disease   . Shortness of breath   . Stroke   . S/P TAVR (transcatheter aortic valve replacement) 11/27/2012    Kaylee Schmitt XT transcatheter heart valve (size 23mm) placed via open left transfemoral approach  . Hyperlipidemia 10/12/2012   intolerant to statins  . Carotid artery occlusion     moderate bilaterally  . Chronic diastolic CHF (congestive heart failure)     Current Outpatient Prescriptions  Medication Sig Dispense Refill  . amLODipine (NORVASC) 10 MG tablet Take 1 tablet (10 mg total) by mouth every evening.  30 tablet  3  . aspirin EC 81 MG tablet Take 81 mg by mouth every morning.      Marland Kitchen atenolol (TENORMIN) 50 MG tablet Take 25 mg by mouth every morning.       Marland Kitchen atorvastatin (LIPITOR) 10 MG tablet Take 10 mg by mouth daily.      . clopidogrel (PLAVIX) 75 MG tablet Take 1 tablet (75 mg total) by mouth daily with breakfast.  30 tablet  3  . furosemide (LASIX) 40 MG tablet Take 40 mg by mouth daily.       . hydrALAZINE (APRESOLINE) 50 MG tablet Take 1 tablet (50 mg total) by mouth every 8 (eight) hours.  90 tablet  3  . isosorbide mononitrate (IMDUR) 60 MG 24 hr tablet Take 1 tablet (60 mg total) by mouth daily.  30 tablet  3  . omeprazole (PRILOSEC) 20 MG capsule Take 20 mg by mouth every morning.       Marland Kitchen  potassium chloride SA (K-DUR,KLOR-CON) 20 MEQ tablet Take 1 tablet (20 mEq total) by mouth daily.  30 tablet  0  . sertraline (ZOLOFT) 100 MG tablet Take 100 mg by mouth every morning.       . valsartan (DIOVAN) 320 MG tablet Take 1 tablet (320 mg total) by mouth every morning.  30 tablet  11   No current facility-administered medications for this visit.    Allergies:    Allergies  Allergen Reactions  . Ramipril Cough  . Adhesive [Tape] Rash    Paper tape OK.  Marland Kitchen Penicillins Itching and Rash  . Sulfa Antibiotics Itching and Rash    Social History:  The patient  reports that she has never smoked. She does not have any smokeless tobacco history on file. She reports that she drinks alcohol. She reports that she does not use illicit drugs.   Family History:  The patient's family history includes Brain cancer in her sister; CAD in her mother; COPD in her sister; CVA in her sister; Cancer - Prostate in her  father.   ROS:  Please see the history of present illness.      All other systems reviewed and negative.   PHYSICAL EXAM: VS:  BP 132/48  Pulse 60  Wt 122 lb (55.339 kg) Well nourished, well developed, in no acute distress HEENT: normal Neck: no JVD Cardiac:  normal S1, S2; RRR; no murmur Lungs:  clear to auscultation bilaterally, no wheezing, rhonchi or rales Abd: soft, nontender, no hepatomegaly Ext: no edema Skin: warm and dry Neuro:  CNs 2-12 intact, no focal abnormalities noted    ASSESSMENT AND PLAN:  1. Severe AS s/p TAVR- recent 2D echo with stable AVR 2. Chronic SOB most likely secondary to chronic diastolic CHF and deconditioning - appears compenstated 3. HTN - controlled   - continue amlodipine/atenolol/hydralazine/valsartan  4. ASCAD - no angina   - continue ASA/Plavix/Diovan  5. Dyslipidemia 6. Chronic diastolic CHF which appears euvolemic on exam  - continue Lasix  - check BMET/CBC  Followup with me in 3 months        Signed, Armanda Magic, MD 04/02/2013 1:25 PM

## 2013-04-03 ENCOUNTER — Other Ambulatory Visit: Payer: Self-pay | Admitting: General Surgery

## 2013-04-03 MED ORDER — POTASSIUM CHLORIDE CRYS ER 20 MEQ PO TBCR: 20.0000 meq | EXTENDED_RELEASE_TABLET | Freq: Two times a day (BID) | ORAL | Status: DC

## 2013-04-16 ENCOUNTER — Other Ambulatory Visit: Payer: Self-pay | Admitting: Gastroenterology

## 2013-04-16 DIAGNOSIS — R112 Nausea with vomiting, unspecified: Secondary | ICD-10-CM

## 2013-04-22 ENCOUNTER — Other Ambulatory Visit: Payer: Self-pay | Admitting: Gastroenterology

## 2013-04-22 ENCOUNTER — Ambulatory Visit
Admission: RE | Admit: 2013-04-22 | Discharge: 2013-04-22 | Disposition: A | Discharge status: Home / Self Care | Payer: Medicare Other | Source: Ambulatory Visit | Attending: Gastroenterology | Admitting: Gastroenterology

## 2013-04-22 DIAGNOSIS — R112 Nausea with vomiting, unspecified: Secondary | ICD-10-CM

## 2013-04-30 ENCOUNTER — Telehealth: Payer: Self-pay | Admitting: Cardiology

## 2013-04-30 NOTE — Telephone Encounter (Signed)
New Message  Courtney from [facility] Called to see if a fax was received from Dr. Madilyn FiremanHayes to hold Plavix for 1 week for a Endoscopy procedure which is scheduled for 01/29.  Fax was sent on 01/15 marked as Urgent. Will resend fax  Please return the fax to:   [phone]

## 2013-04-30 NOTE — Telephone Encounter (Signed)
Pt oked to stop Plavix and ASA 1 week prior to prcedure and restart after procedure. [facility].

## 2013-04-30 NOTE — Telephone Encounter (Signed)
Called [facility] to make aware that we are waiting on Dr Excell Seltzerooper and Dr Mayford Knifeurner to decide.

## 2013-05-15 ENCOUNTER — Emergency Department (HOSPITAL_COMMUNITY): Payer: Medicare Other

## 2013-05-15 ENCOUNTER — Inpatient Hospital Stay (HOSPITAL_COMMUNITY)
Admission: EM | Admit: 2013-05-15 | Discharge: 2013-05-20 | DRG: 682 | Disposition: A | Discharge status: Skilled Nursing Facility | Payer: Medicare Other | Attending: Infectious Disease | Admitting: Infectious Disease

## 2013-05-15 ENCOUNTER — Encounter (HOSPITAL_COMMUNITY): Payer: Self-pay | Admitting: Emergency Medicine

## 2013-05-15 DIAGNOSIS — Z79899 Other long term (current) drug therapy: Secondary | ICD-10-CM

## 2013-05-15 DIAGNOSIS — K222 Esophageal obstruction: Secondary | ICD-10-CM

## 2013-05-15 DIAGNOSIS — I10 Essential (primary) hypertension: Secondary | ICD-10-CM

## 2013-05-15 DIAGNOSIS — E876 Hypokalemia: Secondary | ICD-10-CM

## 2013-05-15 DIAGNOSIS — Z882 Allergy status to sulfonamides status: Secondary | ICD-10-CM

## 2013-05-15 DIAGNOSIS — E860 Dehydration: Secondary | ICD-10-CM

## 2013-05-15 DIAGNOSIS — Z952 Presence of prosthetic heart valve: Secondary | ICD-10-CM

## 2013-05-15 DIAGNOSIS — Z7902 Long term (current) use of antithrombotics/antiplatelets: Secondary | ICD-10-CM

## 2013-05-15 DIAGNOSIS — I4510 Unspecified right bundle-branch block: Secondary | ICD-10-CM | POA: Diagnosis present

## 2013-05-15 DIAGNOSIS — I350 Nonrheumatic aortic (valve) stenosis: Secondary | ICD-10-CM

## 2013-05-15 DIAGNOSIS — Z836 Family history of other diseases of the respiratory system: Secondary | ICD-10-CM

## 2013-05-15 DIAGNOSIS — Z880 Allergy status to penicillin: Secondary | ICD-10-CM

## 2013-05-15 DIAGNOSIS — N189 Chronic kidney disease, unspecified: Secondary | ICD-10-CM

## 2013-05-15 DIAGNOSIS — E872 Acidosis, unspecified: Secondary | ICD-10-CM

## 2013-05-15 DIAGNOSIS — I5030 Unspecified diastolic (congestive) heart failure: Secondary | ICD-10-CM

## 2013-05-15 DIAGNOSIS — I69959 Hemiplegia and hemiparesis following unspecified cerebrovascular disease affecting unspecified side: Secondary | ICD-10-CM

## 2013-05-15 DIAGNOSIS — F32A Depression, unspecified: Secondary | ICD-10-CM

## 2013-05-15 DIAGNOSIS — N179 Acute kidney failure, unspecified: Principal | ICD-10-CM

## 2013-05-15 DIAGNOSIS — N183 Chronic kidney disease, stage 3 unspecified: Secondary | ICD-10-CM | POA: Diagnosis present

## 2013-05-15 DIAGNOSIS — I6529 Occlusion and stenosis of unspecified carotid artery: Secondary | ICD-10-CM

## 2013-05-15 DIAGNOSIS — I440 Atrioventricular block, first degree: Secondary | ICD-10-CM | POA: Diagnosis not present

## 2013-05-15 DIAGNOSIS — R630 Anorexia: Secondary | ICD-10-CM | POA: Diagnosis present

## 2013-05-15 DIAGNOSIS — I129 Hypertensive chronic kidney disease with stage 1 through stage 4 chronic kidney disease, or unspecified chronic kidney disease: Secondary | ICD-10-CM

## 2013-05-15 DIAGNOSIS — R5381 Other malaise: Secondary | ICD-10-CM

## 2013-05-15 DIAGNOSIS — IMO0002 Reserved for concepts with insufficient information to code with codable children: Secondary | ICD-10-CM

## 2013-05-15 DIAGNOSIS — R197 Diarrhea, unspecified: Secondary | ICD-10-CM

## 2013-05-15 DIAGNOSIS — I951 Orthostatic hypotension: Secondary | ICD-10-CM | POA: Diagnosis present

## 2013-05-15 DIAGNOSIS — Z8673 Personal history of transient ischemic attack (TIA), and cerebral infarction without residual deficits: Secondary | ICD-10-CM

## 2013-05-15 DIAGNOSIS — A498 Other bacterial infections of unspecified site: Secondary | ICD-10-CM | POA: Diagnosis present

## 2013-05-15 DIAGNOSIS — Z9181 History of falling: Secondary | ICD-10-CM

## 2013-05-15 DIAGNOSIS — Z8042 Family history of malignant neoplasm of prostate: Secondary | ICD-10-CM

## 2013-05-15 DIAGNOSIS — Z8249 Family history of ischemic heart disease and other diseases of the circulatory system: Secondary | ICD-10-CM

## 2013-05-15 DIAGNOSIS — Z823 Family history of stroke: Secondary | ICD-10-CM

## 2013-05-15 DIAGNOSIS — Z7982 Long term (current) use of aspirin: Secondary | ICD-10-CM

## 2013-05-15 DIAGNOSIS — I5032 Chronic diastolic (congestive) heart failure: Secondary | ICD-10-CM

## 2013-05-15 DIAGNOSIS — Z808 Family history of malignant neoplasm of other organs or systems: Secondary | ICD-10-CM

## 2013-05-15 DIAGNOSIS — K219 Gastro-esophageal reflux disease without esophagitis: Secondary | ICD-10-CM

## 2013-05-15 DIAGNOSIS — I2510 Atherosclerotic heart disease of native coronary artery without angina pectoris: Secondary | ICD-10-CM

## 2013-05-15 DIAGNOSIS — I509 Heart failure, unspecified: Secondary | ICD-10-CM | POA: Diagnosis present

## 2013-05-15 DIAGNOSIS — F3289 Other specified depressive episodes: Secondary | ICD-10-CM | POA: Diagnosis present

## 2013-05-15 DIAGNOSIS — R1115 Cyclical vomiting syndrome unrelated to migraine: Secondary | ICD-10-CM | POA: Diagnosis present

## 2013-05-15 DIAGNOSIS — I130 Hypertensive heart and chronic kidney disease with heart failure and stage 1 through stage 4 chronic kidney disease, or unspecified chronic kidney disease: Secondary | ICD-10-CM | POA: Diagnosis present

## 2013-05-15 DIAGNOSIS — R112 Nausea with vomiting, unspecified: Secondary | ICD-10-CM

## 2013-05-15 DIAGNOSIS — H3530 Unspecified macular degeneration: Secondary | ICD-10-CM | POA: Diagnosis present

## 2013-05-15 DIAGNOSIS — N390 Urinary tract infection, site not specified: Secondary | ICD-10-CM

## 2013-05-15 DIAGNOSIS — N289 Disorder of kidney and ureter, unspecified: Secondary | ICD-10-CM

## 2013-05-15 DIAGNOSIS — E785 Hyperlipidemia, unspecified: Secondary | ICD-10-CM

## 2013-05-15 DIAGNOSIS — Z9849 Cataract extraction status, unspecified eye: Secondary | ICD-10-CM

## 2013-05-15 DIAGNOSIS — Z91199 Patient's noncompliance with other medical treatment and regimen due to unspecified reason: Secondary | ICD-10-CM

## 2013-05-15 DIAGNOSIS — Z9861 Coronary angioplasty status: Secondary | ICD-10-CM

## 2013-05-15 DIAGNOSIS — F329 Major depressive disorder, single episode, unspecified: Secondary | ICD-10-CM | POA: Diagnosis present

## 2013-05-15 DIAGNOSIS — H9190 Unspecified hearing loss, unspecified ear: Secondary | ICD-10-CM | POA: Diagnosis present

## 2013-05-15 DIAGNOSIS — Z9089 Acquired absence of other organs: Secondary | ICD-10-CM

## 2013-05-15 DIAGNOSIS — M109 Gout, unspecified: Secondary | ICD-10-CM

## 2013-05-15 DIAGNOSIS — Z9119 Patient's noncompliance with other medical treatment and regimen: Secondary | ICD-10-CM

## 2013-05-15 DIAGNOSIS — R82998 Other abnormal findings in urine: Secondary | ICD-10-CM

## 2013-05-15 DIAGNOSIS — Z66 Do not resuscitate: Secondary | ICD-10-CM | POA: Diagnosis present

## 2013-05-15 DIAGNOSIS — I959 Hypotension, unspecified: Secondary | ICD-10-CM

## 2013-05-15 DIAGNOSIS — Z888 Allergy status to other drugs, medicaments and biological substances status: Secondary | ICD-10-CM

## 2013-05-15 DIAGNOSIS — E43 Unspecified severe protein-calorie malnutrition: Secondary | ICD-10-CM

## 2013-05-15 DIAGNOSIS — E162 Hypoglycemia, unspecified: Secondary | ICD-10-CM | POA: Diagnosis not present

## 2013-05-15 HISTORY — DX: Shortness of breath: R06.02

## 2013-05-15 HISTORY — DX: Cerebral infarction, unspecified: I63.9

## 2013-05-15 HISTORY — DX: Pneumonia, unspecified organism: J18.9

## 2013-05-15 HISTORY — DX: Gastro-esophageal reflux disease without esophagitis: K21.9

## 2013-05-15 HISTORY — DX: Unspecified macular degeneration: H35.30

## 2013-05-15 HISTORY — DX: Unspecified chronic bronchitis: J42

## 2013-05-15 HISTORY — DX: Personal history of other medical treatment: Z92.89

## 2013-05-15 HISTORY — DX: Acute myocardial infarction, unspecified: I21.9

## 2013-05-15 LAB — URINALYSIS W MICROSCOPIC + REFLEX CULTURE
Glucose, UA: NEGATIVE mg/dL
Hgb urine dipstick: NEGATIVE
KETONES UR: 15 mg/dL — AB
NITRITE: POSITIVE — AB
Protein, ur: NEGATIVE mg/dL
SPECIFIC GRAVITY, URINE: 1.02 (ref 1.005–1.030)
UROBILINOGEN UA: 1 mg/dL (ref 0.0–1.0)
pH: 5.5 (ref 5.0–8.0)

## 2013-05-15 LAB — CBC WITH DIFFERENTIAL/PLATELET
Basophils Absolute: 0 10*3/uL (ref 0.0–0.1)
Basophils Relative: 0 % (ref 0–1)
EOS ABS: 0.1 10*3/uL (ref 0.0–0.7)
EOS PCT: 2 % (ref 0–5)
HCT: 38.1 % (ref 36.0–46.0)
HEMOGLOBIN: 13.2 g/dL (ref 12.0–15.0)
LYMPHS ABS: 0.8 10*3/uL (ref 0.7–4.0)
Lymphocytes Relative: 13 % (ref 12–46)
MCH: 30.3 pg (ref 26.0–34.0)
MCHC: 34.6 g/dL (ref 30.0–36.0)
MCV: 87.4 fL (ref 78.0–100.0)
Monocytes Absolute: 0.5 10*3/uL (ref 0.1–1.0)
Monocytes Relative: 8 % (ref 3–12)
Neutro Abs: 4.7 10*3/uL (ref 1.7–7.7)
Neutrophils Relative %: 77 % (ref 43–77)
Platelets: 155 10*3/uL (ref 150–400)
RBC: 4.36 MIL/uL (ref 3.87–5.11)
RDW: 17.6 % — ABNORMAL HIGH (ref 11.5–15.5)
WBC: 6.1 10*3/uL (ref 4.0–10.5)

## 2013-05-15 LAB — COMPREHENSIVE METABOLIC PANEL
ALT: 22 U/L (ref 0–35)
AST: 33 U/L (ref 0–37)
Albumin: 3 g/dL — ABNORMAL LOW (ref 3.5–5.2)
Alkaline Phosphatase: 68 U/L (ref 39–117)
BUN: 28 mg/dL — ABNORMAL HIGH (ref 6–23)
CALCIUM: 8.8 mg/dL (ref 8.4–10.5)
CO2: 26 mEq/L (ref 19–32)
CREATININE: 1.55 mg/dL — AB (ref 0.50–1.10)
Chloride: 98 mEq/L (ref 96–112)
GFR calc non Af Amer: 29 mL/min — ABNORMAL LOW (ref >90)
GFR, EST AFRICAN AMERICAN: 34 mL/min — AB (ref >90)
GLUCOSE: 124 mg/dL — AB (ref 70–99)
Potassium: 3 mEq/L — ABNORMAL LOW (ref 3.7–5.3)
Sodium: 141 mEq/L (ref 137–147)
Total Bilirubin: 1.1 mg/dL (ref 0.3–1.2)
Total Protein: 5.2 g/dL — ABNORMAL LOW (ref 6.0–8.3)

## 2013-05-15 LAB — PHOSPHORUS: PHOSPHORUS: 3.1 mg/dL (ref 2.3–4.6)

## 2013-05-15 LAB — LIPASE, BLOOD: LIPASE: 29 U/L (ref 11–59)

## 2013-05-15 LAB — CG4 I-STAT (LACTIC ACID): Lactic Acid, Venous: 2.1 mmol/L (ref 0.5–2.2)

## 2013-05-15 LAB — MAGNESIUM: Magnesium: 1.6 mg/dL (ref 1.5–2.5)

## 2013-05-15 LAB — TROPONIN I

## 2013-05-15 MED ORDER — ATORVASTATIN CALCIUM 10 MG PO TABS
10.0000 mg | ORAL_TABLET | Freq: Every day | ORAL | Status: DC
  Administered 2013-05-16 – 2013-05-18 (×3): 10 mg via ORAL
  Filled 2013-05-15 (×4): qty 1

## 2013-05-15 MED ORDER — PANTOPRAZOLE SODIUM 40 MG PO TBEC
40.0000 mg | DELAYED_RELEASE_TABLET | Freq: Every day | ORAL | Status: DC
  Administered 2013-05-16 – 2013-05-20 (×5): 40 mg via ORAL
  Filled 2013-05-15 (×5): qty 1

## 2013-05-15 MED ORDER — POTASSIUM CHLORIDE 10 MEQ/100ML IV SOLN
10.0000 meq | INTRAVENOUS | Status: AC
  Administered 2013-05-15 – 2013-05-16 (×5): 10 meq via INTRAVENOUS
  Filled 2013-05-15 (×5): qty 100

## 2013-05-15 MED ORDER — SODIUM CHLORIDE 0.9 % IV SOLN
INTRAVENOUS | Status: DC
  Administered 2013-05-15: 75 mL/h via INTRAVENOUS
  Administered 2013-05-15 – 2013-05-17 (×3): via INTRAVENOUS

## 2013-05-15 MED ORDER — FAMOTIDINE IN NACL 20-0.9 MG/50ML-% IV SOLN
20.0000 mg | Freq: Once | INTRAVENOUS | Status: AC
  Administered 2013-05-15: 20 mg via INTRAVENOUS
  Filled 2013-05-15: qty 50

## 2013-05-15 MED ORDER — IOHEXOL 300 MG/ML  SOLN: 25.0000 mL | INTRAMUSCULAR | Status: DC

## 2013-05-15 MED ORDER — SERTRALINE HCL 100 MG PO TABS
100.0000 mg | ORAL_TABLET | Freq: Every morning | ORAL | Status: DC
  Administered 2013-05-16 – 2013-05-20 (×5): 100 mg via ORAL
  Filled 2013-05-15 (×5): qty 1

## 2013-05-15 MED ORDER — CLOPIDOGREL BISULFATE 75 MG PO TABS
75.0000 mg | ORAL_TABLET | Freq: Every day | ORAL | Status: DC
  Administered 2013-05-16 – 2013-05-20 (×5): 75 mg via ORAL
  Filled 2013-05-15 (×6): qty 1

## 2013-05-15 MED ORDER — ONDANSETRON HCL 4 MG/2ML IJ SOLN: 4.0000 mg | INTRAMUSCULAR | Status: DC | PRN

## 2013-05-15 MED ORDER — POTASSIUM CHLORIDE 10 MEQ/100ML IV SOLN
10.0000 meq | Freq: Once | INTRAVENOUS | Status: AC
  Administered 2013-05-15: 10 meq via INTRAVENOUS
  Filled 2013-05-15: qty 100

## 2013-05-15 MED ORDER — DEXTROSE 5 % IV SOLN
1.0000 g | Freq: Once | INTRAVENOUS | Status: AC
  Administered 2013-05-15: 1 g via INTRAVENOUS
  Filled 2013-05-15: qty 10

## 2013-05-15 MED ORDER — HEPARIN SODIUM (PORCINE) 5000 UNIT/ML IJ SOLN
5000.0000 [IU] | Freq: Three times a day (TID) | INTRAMUSCULAR | Status: DC
  Administered 2013-05-15 – 2013-05-20 (×13): 5000 [IU] via SUBCUTANEOUS
  Filled 2013-05-15 (×17): qty 1

## 2013-05-15 MED ORDER — ASPIRIN EC 81 MG PO TBEC
81.0000 mg | DELAYED_RELEASE_TABLET | Freq: Every morning | ORAL | Status: DC
  Administered 2013-05-16 – 2013-05-20 (×5): 81 mg via ORAL
  Filled 2013-05-15 (×5): qty 1

## 2013-05-15 NOTE — ED Notes (Signed)
Pt from [facility] with c/o n/v/d.  Pt has not been feeling her normal self for 2 weeks.  Pt is normally ambulatory and alert; lethargic on arrival.

## 2013-05-15 NOTE — H&P (Signed)
I repeated the critical or key portions of the exam.  I confirmed/revised the medical student's history, exam, assessment and plan.   

## 2013-05-15 NOTE — H&P (Signed)
Date: 05/15/2013               Patient Name:  Kaylee Schmitt MRN: [id number]  DOB: [birth year] Age / Sex: 80 y.o., female   PCP: Sueanne Margarita, MD           Medical Service: Internal Medicine Teaching Service         Attending Physician: Dr. Truman Hayward, MD    First Contact: Dr. Michail Jewels, MD Pager: [phone]  Second Contact: Dr. Jessee Avers, MD Pager: [phone]       After Hours (After 5p/  First Contact Pager: [phone]  weekends / holidays): Second Contact Pager: [phone]    Most Recent Discharge Date:  03/25/13  Chief Complaint:  Chief Complaint  Patient presents with  . Weakness       History of present illness: Pt is a 80 y.o. female who has a PMH of CAD; CVA; HTN; CKD3; S/P TAVR (transcatheter aortic valve replacement) (11/27/2012); hyperlipidemia; carotid artery disease; and chronic diastolic heart failure.  Pt presents to the ED from [facility] with ?intractable N/V/D and epigastric pain since November which has been worked up by GI extensively.  She had an EGD recently that showed no abnormalities.  She is a rather poor historian and does not know much about her history or medications.  She also has some hearing loss for which she wears hearing aids.  She has tried several medications without relief.  She reports that nothing makes it better but eating makes it worse.  According to notes by her cardiologist she has c/o nausea since her AV replacement.  She denies any fever/chills, dysphagia, CP, or urinary symptoms.  She does report some SOB which is chronic.  It is unclear if she had a recent colonoscopy.    According to the son, she has experienced several losses recently of close friends/relatives which he feels may be contributing.  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  She does not eat much as "it is not her kind of cooking."  She also has significant weight loss since November according to the family.   Otherwise she is ambulatory and uses a walker for assistance.   In the ED, she was given pepcid IV 42m, potassium chloride 156m IV x1, and 1g cetriaxone.  She reports no relief with medications although she does not appear uncomfortable.     Meds: Current Facility-Administered Medications  Medication Dose Route Frequency Provider Last Rate Last Dose  . 0.9 %  sodium chloride infusion   Intravenous Continuous KaAlfonzo FellerDO 75 mL/hr at 05/15/13 1149    . ondansetron (ZOFRAN) injection 4 mg  4 mg Intravenous Q1H PRN KaAlfonzo FellerDO      . potassium chloride 10 mEq in 100 mL IVPB  10 mEq Intravenous Q1 Hr x 6 Neema K Sharda, MD        Prescriptions prior to admission  Medication Sig Dispense Refill  . amLODipine (NORVASC) 10 MG tablet Take 1 tablet (10 mg total) by mouth every evening.  30 tablet  3  . aspirin EC 81 MG tablet Take 81 mg by mouth every morning.      . Marland Kitchentenolol (TENORMIN) 25 MG tablet Take 25 mg by mouth daily.      . Marland Kitchentorvastatin (LIPITOR) 10 MG tablet Take 10 mg by mouth daily.      . clopidogrel (PLAVIX) 75 MG  tablet Take 1 tablet (75 mg total) by mouth daily with breakfast.  30 tablet  3  . furosemide (LASIX) 40 MG tablet Take 40 mg by mouth daily.       . hydrALAZINE (APRESOLINE) 50 MG tablet Take 1 tablet (50 mg total) by mouth every 8 (eight) hours.  90 tablet  3  . isosorbide mononitrate (IMDUR) 60 MG 24 hr tablet Take 1 tablet (60 mg total) by mouth daily.  30 tablet  3  . omeprazole (PRILOSEC) 20 MG capsule Take 20 mg by mouth 2 (two) times daily before a meal.       . PHENADOZ 25 MG suppository Place 25 mg rectally every 6 (six) hours as needed.      . potassium chloride SA (K-DUR,KLOR-CON) 20 MEQ tablet Take 40 mEq by mouth daily.      . sertraline (ZOLOFT) 100 MG tablet Take 100 mg by mouth every morning.       . sucralfate (CARAFATE) 1 GM/10ML suspension Take 1 g by mouth 4 (four) times daily -  with meals and at bedtime.      . valsartan  (DIOVAN) 320 MG tablet Take 1 tablet (320 mg total) by mouth every morning.  30 tablet  11    Allergies: Allergies as of 05/15/2013 - Review Complete 05/15/2013  Allergen Reaction Noted  . Ramipril Cough 03/13/2013  . Adhesive [tape] Rash 11/21/2012  . Penicillins Itching and Rash 10/12/2012  . Sulfa antibiotics Itching and Rash 10/12/2012    PMH: Past Medical History  Diagnosis Date  . Aortic stenosis   . CAD (coronary artery disease) 10/12/2012    Cath 2011  Calcified LM,  80% mid LAD, dominant circ without sig disease, Ostial nondominant RCA.  Severe AS  Liberte stent x 2 2011 Dr. Leonia Reeves    . History of CVA (cerebrovascular accident)     2003 while living in [place].   Right brain with left hemiparesis   . Gout 10/12/2012  . Hypertensive heart disease 10/12/2012  . Hypertension   . Chronic kidney disease   . Shortness of breath   . Stroke   . S/P TAVR (transcatheter aortic valve replacement) 11/27/2012    Berniece Pap XT transcatheter heart valve (size 91m) placed via open left transfemoral approach  . Hyperlipidemia 10/12/2012    intolerant to statins  . Carotid artery occlusion     moderate bilaterally  . Chronic diastolic CHF (congestive heart failure)     PSH: Past Surgical History  Procedure Laterality Date  . Coronary stent placement    . Cholecystectomy    . Appendectomy    . Cataract extraction    . Cardiac catheterization    . Transcatheter aortic valve replacement, transfemoral N/A 11/27/2012    Procedure: TRANSCATHETER AORTIC VALVE REPLACEMENT, TRANSFEMORAL;  Surgeon: MSherren Mocha MD;  [facility]  Service: Cardiovascular;  Laterality: N/A;  . Intraoperative transesophageal echocardiogram N/A 11/27/2012    Procedure: INTRAOPERATIVE TRANSESOPHAGEAL ECHOCARDIOGRAM;  Surgeon: MSherren Mocha MD;  [facility];  Service: Cardiovascular;  Laterality: N/A;    FH: Family History  Problem Relation Age of Onset  . Cancer - Prostate Father   . CAD Mother   .  CVA Sister   . Brain cancer Sister   . COPD Sister     SH: History  Substance Use Topics  . Smoking status: Never Smoker   . Smokeless tobacco: Not on file  . Alcohol Use: Yes     Comment: occasionally  Review of Systems: Pertinent items are noted in HPI.  Physical Exam: Blood pressure 124/41, pulse 58, temperature 97.4 F (36.3 C), resp. rate 17, SpO2 100.00%.  Physical Exam Constitutional: Vital signs reviewed.  Patient is a pale elderly well appearing female in NAD and somewhat cooperative with exam although she is a poor historian.   Head: Normocephalic and atraumatic Eyes: EOMI, pale conjunctivael, no scleral icterus.  Neck: Supple, Trachea midline. Throat: Moist mucous membranes.  Cardiovascular: RRR, pulses symmetric and intact bilaterally Pulmonary/Chest: normal respiratory effort, CTAB, no wheezes, rales, or rhonchi Abdominal: Soft. Epigastric tenderness without distention, rebound, or guarding; +bs.   Neurological: A&O x3, cranial nerve II-XII are grossly intact, no focal motor deficit noted. Skin: Cool extremities, dry and intact.  No rash, cyanosis, or clubbing.  Psychiatric: Normal mood and affect.    Lab results:  Basic Metabolic Panel:  Recent Labs  05/15/13 1140  NA 141  K 3.0*  CL 98  CO2 26  GLUCOSE 124*  BUN 28*  CREATININE 1.55*  CALCIUM 8.8   Anion Gap:  17  Magnesium: pending  Liver Function Tests:  Recent Labs  05/15/13 1140  AST 33  ALT 22  ALKPHOS 68  BILITOT 1.1  PROT 5.2*  ALBUMIN 3.0*    Recent Labs  05/15/13 1140  LIPASE 29   No results found for this basename: AMMONIA,  in the last 72 hours  CBC:    Component Value Date/Time   WBC 6.1 05/15/2013 1140   RBC 4.36 05/15/2013 1140   RBC 3.56* 10/13/2012 1340   HGB 13.2 05/15/2013 1140   HCT 38.1 05/15/2013 1140   PLT 155 05/15/2013 1140   MCV 87.4 05/15/2013 1140   MCH 30.3 05/15/2013 1140   MCHC 34.6 05/15/2013 1140   RDW 17.6* 05/15/2013 1140   LYMPHSABS 0.8 05/15/2013  1140   MONOABS 0.5 05/15/2013 1140   EOSABS 0.1 05/15/2013 1140   BASOSABS 0.0 05/15/2013 1140    Cardiac Enzymes: No results found for this basename: TROPIPOC,  in the last 72 hours Lab Results  Component Value Date   CKTOTAL 52 01/25/2010   CKMB 0.6 01/25/2010   TROPONINI <0.30 05/15/2013   03/28/13 Transthoracic Echocardiogram: Study Conclusions - Left ventricle: The cavity size was normal. There was moderate concentric hypertrophy. Systolic function was normal. The estimated ejection fraction was in the range of 60% to 65%. Wall motion was normal; there were no regional wall motion abnormalities. There was an increased relative contribution of atrial contraction to ventricular filling. Features are consistent with a pseudonormal left ventricular filling pattern, with concomitant abnormal relaxation and increased filling pressure (grade 2 diastolic dysfunction). - Aortic valve: Mean gradient: 673m Hg (S). Peak gradient: 2573mHg (S). - Mitral valve: Severely calcified annulus. Moderate thickening. Mean gradient: 73m573mg (D). Peak gradient: 8mm64m (D). Valve area by pressure half-time: 1.75cm^2. - Atrial septum: No defect or patent foramen ovale was identified. - Tricuspid valve: Moderate regurgitation. - Pulmonary arteries: PA peak pressure: 42mm84m(S). Impressions: - The right ventricular systolic pressure was increased consistent with mild pulmonary hypertension.  BNP: No results found for this basename: PROBNP,  in the last 72 hours  D-Dimer: No results found for this basename: DDIMER,  in the last 72 hours  CBG: No results found for this basename: GLUCAP,  in the last 72 hours  Hemoglobin A1C: No results found for this basename: HGBA1C,  in the last 72 hours  Lipid Panel: No results found for this  basename: CHOL, HDL, LDLCALC, TRIG, CHOLHDL, LDLDIRECT,  in the last 72 hours  Thyroid Function Tests: No results found for this basename: TSH, T4TOTAL, FREET4, T3FREE,  THYROIDAB,  in the last 72 hours  Anemia Panel: No results found for this basename: VITAMINB12, FOLATE, FERRITIN, TIBC, IRON, RETICCTPCT,  in the last 72 hours  Coagulation: No results found for this basename: LABPROT, INR,  in the last 72 hours  Urine Drug Screen: Drugs of Abuse:  No results found for this basename: labopia,  cocainscrnur,  labbenz,  amphetmu,  thcu,  labbarb    Alcohol Level: No results found for this basename: ETH,  in the last 72 hours  Urinalysis: Results for Kaylee, Schmitt (MRN [id number]) as of 05/15/2013 21:52  Ref. Range 05/15/2013 14:42  Color, Urine Latest Range: YELLOW  YELLOW  APPearance Latest Range: CLEAR  CLOUDY (A)  Specific Gravity, Urine Latest Range: 1.005-1.030  1.020  pH Latest Range: 5.0-8.0  5.5  Glucose Latest Range: NEGATIVE mg/dL NEGATIVE  Bilirubin Urine Latest Range: NEGATIVE  MODERATE (A)  Ketones, ur Latest Range: NEGATIVE mg/dL 15 (A)  Protein Latest Range: NEGATIVE mg/dL NEGATIVE  Urobilinogen, UA Latest Range: 0.0-1.0 mg/dL 1.0  Nitrite Latest Range: NEGATIVE  POSITIVE (A)  Leukocytes, UA Latest Range: NEGATIVE  LARGE (A)  Hgb urine dipstick Latest Range: NEGATIVE  NEGATIVE  WBC, UA Latest Range: <3 WBC/hpf 21-50  Squamous Epithelial / LPF Latest Range: RARE  RARE  Bacteria, UA Latest Range: RARE  MANY (A)  Casts Latest Range: NEGATIVE  HYALINE CASTS (A)    Imaging results:  Ct Abdomen Pelvis Wo Contrast  05/15/2013   CLINICAL DATA:  Nausea, vomiting and diarrhea.  EXAM: CT ABDOMEN AND PELVIS WITHOUT CONTRAST  TECHNIQUE: Multidetector CT imaging of the abdomen and pelvis was performed following the standard protocol without intravenous contrast.  COMPARISON:  DG UGI W/KUB dated 04/22/2013; CT CTA ABD/PEL W/CM AND/OR W/O CM dated 11/06/2012  FINDINGS: Lung bases show subpleural reticulation. Heart is at the upper limits of normal in size. No pericardial or pleural effusion.  Liver is unremarkable. Cholecystectomy. Adrenal glands,  kidneys, spleen, pancreas, stomach and small bowel are unremarkable. Apparent wall thickening involving the transverse colon may be due to underdistention. Colon is otherwise unremarkable. Air in the bladder is presumably iatrogenic. Uterus and ovaries are visualized. Trace pelvic free fluid. Atherosclerotic calcification of the arterial vasculature without abdominal aortic aneurysm. No pathologically enlarged lymph nodes. No worrisome lytic or sclerotic lesions. Degenerative changes are seen in the spine. Grade 1 anterolisthesis of L4 on L5, as before.  IMPRESSION: No acute findings to explain the patient's given symptoms. The appearance of slight wall thickening involving the transverse colon is likely due to underdistention.   Electronically Signed   By: Lorin Picket M.D.   On: 05/15/2013 14:37   Dg Chest 2 View  05/15/2013   CLINICAL DATA:  Diarrhea with vomiting and weakness.  EXAM: CHEST  2 VIEW  COMPARISON:  12/28/2012.  FINDINGS: Two views study shows no focal airspace consolidation. No pulmonary edema. Interstitial markings are diffusely coarsened with chronic features. The cardio pericardial silhouette is enlarged. Patient is status post aortic valve replacement. Telemetry leads overlie the chest.  IMPRESSION: No active cardiopulmonary disease.   Electronically Signed   By: Misty Stanley M.D.   On: 05/15/2013 12:09    EKG:  Date/Time:    Ventricular Rate:    PR Interval:    QRS Duration:   QT Interval:  QTC Calculation:   R Axis:     Text Interpretation:      Antibiotics: Antibiotics Given (last 72 hours)   None      Anti-infectives   Start     Dose/Rate Route Frequency Ordered Stop   05/15/13 1530  cefTRIAXone (ROCEPHIN) 1 g in dextrose 5 % 50 mL IVPB     1 g 100 mL/hr over 30 Minutes Intravenous  Once 05/15/13 1518 05/15/13 1723      SIRS/Sepsis/Septic Shock criteria met: No   Consults:    Assessment & Plan by Problem:  Pt is a 80 y.o. female who has a PMH of  CAD; CVA; HTN; CKD; S/P TAVR (transcatheter aortic valve replacement) (11/27/2012); hyperlipidemia; carotid artery disease; and chronic diastolic heart failure.  She comes to the ED with chronic N/V/D, abdominal pain, and AKI.    Problem List Items Addressed This Visit   None    Visit Diagnoses   Intractable nausea and vomiting    -  Primary    Hypokalemia        Renal insufficiency        Dehydration        Hypotension        Relevant Medications       atenolol (TENORMIN) 25 MG tablet    UTI (urinary tract infection)          #Chronic nausea/vomiting/diarrhea - Pt presents to the ED with N/V, epigastric pain, and diarrhea for months.  Unlikely to be infectious since this has been going on for so long.  Other etiologies include dyspepsia due to PUD, GERD, medication induced (pt does take an ASA, CCB, and potassium daily but had recent nl EGD), medications, SBO (unlikely as pt had no distention and CT Abd which revealed no acute findings to explain the patient's symptoms), ACS (unlikely given chronic nature), bowel ischemia (lactic acid wnl).  Depression may also be playing a role.  Lactic acid wnl. Lipase wnl.  Labs do not support -admit to inpatient -clear liquids -37m/hr NS -continue PPI (protonix); pt on prilosec at home -strict documentation of any episodes of vomiting/diarrhea  #Acute on CKD3 - Pt Cr is elevated at 1.55.  Her baseline is ~1.0.  Most likely is prerenal from volume depletion in the setting of continued valsartan given Cr:BUN ratio is ~20.  Pt positive for orthostasis.  UA revealed positive ketones.  Additionally, On exam, pt exhibits no bladder distention or suprapubic tenderness so unlikely post-obstructive.  Pt is without hyperkalemia, acidosis or complications of AKI.   -NS 726mhr -check mg/phos -strict I/Os -d/c valsartan -TSH -avoid nephrotoxic med -BMP -consider renal USKoreaf not improving with IVF  #Asymptomatic bacteruria - Pt without urinary symptoms  however, UA revealed leukocytes and nitrites.   -ceftriaxone 1g IV   #HTN - Pt orthostatic on exam. -hold BP meds   #Chronic diastolic HF - Pt did not appear volume overloaded on exam -d/c lasix -holding BP meds given hypotension -daily weights   #FEN- [id number]  Electrolytes-Replete as needed; hypokalemia-check  magnesium  Diet-Clear liquids    #VTE prophylaxis- 5000 Units Heparin SQ tid   #Code status - DNR  #Dispo- Disposition is deferred at this time, awaiting improvement of current medical problems.  Anticipated discharge in approximately 1-2 day(s).    Emergency Contact: Contact Information   Name Relation Home Work Mobile   Kozlowski,Christine Daughter [phone], [phone]  Matherne,Courtland Son [phone]  The patient does have a current PCP (Sueanne Margarita, MD) and does need an [facility] hospital follow-up appointment after discharge.  Signed: Michail Jewels, MD PGY-1, Internal Medicine  [phone] (7AM-5PM Mon-Fri) 05/15/2013, 5:23 PM    Date: 05/16/2013  Patient name: Kaylee Schmitt  Medical record number: [id number]  Date of birth: [birth year]   I have seen and evaluated Kaylee Schmitt and discussed their care with the Residency Team.   Assessment and Plan: I have seen and evaluated the patient as outlined above. I agree with the formulated Assessment and Plan as detailed in the residents' admission note, with the following changes:   A 80 year old lady with multiple comorbidities including aortic stenosis cyst status post transcatheter-based aortic valve replacement, who has had a several month history of poor oral intake nausea malaise loose stools. She's been worked up by gastroenterology with apparently a negative EGD. I do not have she's had a colonoscopy.  Brought to the emergency Department [facility] after having been found unresponsive on the floor of her assisted living facility. She  does not recall the events that preceded her becoming unresponsive. Here in the hospital she was found to be frankly orthostatic which I suspect was the reason for her apparent syncope.  She seems to be responding to intravenous fluids. She had pyuria although not clear whether she had accompanying symptoms with this and less that might be confusion in the setting of her syncopal episode. She is now growing 100,000 colony-forming units of Escherichia coli from urine culture.  She is also some swelling developed worsening diarrhea with more bowel movements since admission and her stool is now been sent for C. Difficile PCR.  I agree with continued supportive care. I am not completely convinced of urinary tract infection but under the circumstances of her syncopal event I think it is reasonable to continue antibiotics targeting her Escherichia coli. Certainly if she does turn out to have C. Difficile colitis will be very important to review of her antibiotics for "urinary tract infection as soon as possible.  With regard to her chronic poor oral intake nausea vomiting diarrhea she has had workup by GI as an outpatient and we may need to see whether or not further investigation is to occur as an inpatient. She had a CT scan the abdomen and pelvis done on admission did show some possible colonic wall thickening.  She is not in high risk demographic for HIV infection but would give test her once since I do not see documentation of prior HIV testing.     Truman Hayward, [place] 2/5/201511:53 AM

## 2013-05-15 NOTE — ED Notes (Signed)
Admitting MD at bedside.

## 2013-05-15 NOTE — ED Notes (Signed)
Lactic acid results called to primary nurse Karolee StampsJanelle

## 2013-05-15 NOTE — ED Provider Notes (Signed)
CSN: [id number]     Arrival date & time 05/15/13  1108 History   First MD Initiated Contact with Patient 05/15/13 1116     Chief Complaint  Patient presents with  . Weakness    HPI Pt was seen at 1125. Per pt, c/o gradual onset and persistence of multiple intermittent episodes of N/V for the past 2 months. States the diarrhea began several days ago. Describes the stools as "watery." Has been associated with generalized abd "pain."  NH states pt "is more tired than usual" for the past 2 weeks. Pt states she was evaluated by her PMD and GI MD for same with unknown final diagnosis. Denies CP/SOB, no back pain, no fevers, no black or blood in stools or emesis.    PMD: Dr. Eugene Gavia Past Medical History  Diagnosis Date  . Aortic stenosis   . CAD (coronary artery disease) 10/12/2012    Cath 2011  Calcified LM,  80% mid LAD, dominant circ without sig disease, Ostial nondominant RCA.  Severe AS  Liberte stent x 2 2011 Dr. Amil Amen    . History of CVA (cerebrovascular accident)     2003 while living in [place].   Right brain with left hemiparesis   . Gout 10/12/2012  . Hypertensive heart disease 10/12/2012  . Hypertension   . Chronic kidney disease   . Shortness of breath   . Stroke   . S/P TAVR (transcatheter aortic valve replacement) 11/27/2012    Stephannie Peters XT transcatheter heart valve (size 23mm) placed via open left transfemoral approach  . Hyperlipidemia 10/12/2012    intolerant to statins  . Carotid artery occlusion     moderate bilaterally  . Chronic diastolic CHF (congestive heart failure)    Past Surgical History  Procedure Laterality Date  . Coronary stent placement    . Cholecystectomy    . Appendectomy    . Cataract extraction    . Cardiac catheterization    . Transcatheter aortic valve replacement, transfemoral N/A 11/27/2012    Procedure: TRANSCATHETER AORTIC VALVE REPLACEMENT, TRANSFEMORAL;  Surgeon: Tonny Bollman, MD;  Location: [facility] OR;  Service: Cardiovascular;  Laterality:  N/A;  . Intraoperative transesophageal echocardiogram N/A 11/27/2012    Procedure: INTRAOPERATIVE TRANSESOPHAGEAL ECHOCARDIOGRAM;  Surgeon: Tonny Bollman, MD;  Location: [facility] OR;  Service: Cardiovascular;  Laterality: N/A;   Family History  Problem Relation Age of Onset  . Cancer - Prostate Father   . CAD Mother   . CVA Sister   . Brain cancer Sister   . COPD Sister    History  Substance Use Topics  . Smoking status: Never Smoker   . Smokeless tobacco: Not on file  . Alcohol Use: Yes     Comment: occasionally    Review of Systems ROS: Statement: All systems negative except as marked or noted in the HPI; Constitutional: Negative for fever and chills. +generalized weakness/fatigue.; ; Eyes: Negative for eye pain, redness and discharge. ; ; ENMT: Negative for ear pain, hoarseness, nasal congestion, sinus pressure and sore throat. ; ; Cardiovascular: Negative for chest pain, palpitations, diaphoresis, dyspnea and peripheral edema. ; ; Respiratory: Negative for cough, wheezing and stridor. ; ; Gastrointestinal: +abd pain, N/V/D. Negative for blood in stool, hematemesis, jaundice and rectal bleeding. . ; ; Genitourinary: Negative for dysuria, flank pain and hematuria. ; ; Musculoskeletal: Negative for back pain and neck pain. Negative for swelling and trauma.; ; Skin: Negative for pruritus, rash, abrasions, blisters, bruising and skin lesion.; ; Neuro: Negative for headache,  lightheadedness and neck stiffness. Negative for altered level of consciousness , altered mental status, extremity weakness, paresthesias, involuntary movement, seizure and syncope.      Allergies  Ramipril; Adhesive; Penicillins; and Sulfa antibiotics  Home Medications   Current Outpatient Rx  Name  Route  Sig  Dispense  Refill  . amLODipine (NORVASC) 10 MG tablet   Oral   Take 1 tablet (10 mg total) by mouth every evening.   30 tablet   3   . aspirin EC 81 MG tablet   Oral   Take 81 mg by mouth every morning.          Marland Kitchen atorvastatin (LIPITOR) 10 MG tablet   Oral   Take 10 mg by mouth daily.         . clopidogrel (PLAVIX) 75 MG tablet   Oral   Take 1 tablet (75 mg total) by mouth daily with breakfast.   30 tablet   3   . furosemide (LASIX) 40 MG tablet   Oral   Take 40 mg by mouth daily.          . hydrALAZINE (APRESOLINE) 50 MG tablet   Oral   Take 1 tablet (50 mg total) by mouth every 8 (eight) hours.   90 tablet   3   . isosorbide mononitrate (IMDUR) 60 MG 24 hr tablet   Oral   Take 1 tablet (60 mg total) by mouth daily.   30 tablet   3   . omeprazole (PRILOSEC) 20 MG capsule   Oral   Take 20 mg by mouth every morning.          . sertraline (ZOLOFT) 100 MG tablet   Oral   Take 100 mg by mouth every morning.          . valsartan (DIOVAN) 320 MG tablet   Oral   Take 1 tablet (320 mg total) by mouth every morning.   30 tablet   11    BP 93/42  Pulse 78  Temp(Src) 97.4 F (36.3 C)  Resp 18  SpO2 100% Filed Vitals:   05/15/13 1144 05/15/13 1146 05/15/13 1217 05/15/13 1500  BP: 105/52 93/42 116/52 110/74  Pulse: 69 78 44 63  Temp:      Resp:   21 17  SpO2:   99% 100%    Physical Exam 1130: Physical examination:  Nursing notes reviewed; Vital signs and O2 SAT reviewed;  Constitutional: Thin, In no acute distress; Head:  Normocephalic, atraumatic; Eyes: EOMI, PERRL, No scleral icterus; ENMT: Mouth and pharynx normal, Mucous membranes dry; Neck: Supple, Full range of motion, No lymphadenopathy; Cardiovascular: Irregular rate and rhythm, No gallop; Respiratory: Breath sounds clear & equal bilaterally, No wheezes.  Speaking full sentences with ease, Normal respiratory effort/excursion; Chest: Nontender, Movement normal; Abdomen: Soft, +mild diffuse tenderness to palp. No rebound or guarding. Nondistended, Normal bowel sounds; Genitourinary: No CVA tenderness; Extremities: Pulses normal, No tenderness, No edema, No calf edema or asymmetry.; Neuro: AA&Ox3, vague  historia. +HOH, otherwise major CN grossly intact.  Speech clear. No gross focal motor or sensory deficits in extremities.; Skin: Color normal, Warm, Dry.   ED Course  Procedures    EKG Interpretation   None       MDM  MDM Reviewed: previous chart, nursing note and vitals Reviewed previous: labs, ECG and x-ray Interpretation: labs, ECG, x-ray and CT scan      Date: 05/15/2013  Rate: 61  Rhythm: normal sinus rhythm and premature  ventricular contractions (PVC)  QRS Axis: left  Intervals: PR prolonged  ST/T Wave abnormalities: normal  Conduction Disutrbances:first-degree A-V block , right bundle branch block and left anterior fascicular block  Narrative Interpretation:   Old EKG Reviewed: unchanged; no significant changes from previous EKG dadted 12/28/2012.   Results for orders placed during the hospital encounter of 05/15/13  URINALYSIS W MICROSCOPIC + REFLEX CULTURE      Result Value Range   Color, Urine YELLOW  YELLOW   APPearance CLOUDY (*) CLEAR   Specific Gravity, Urine 1.020  1.005 - 1.030   pH 5.5  5.0 - 8.0   Glucose, UA NEGATIVE  NEGATIVE mg/dL   Hgb urine dipstick NEGATIVE  NEGATIVE   Bilirubin Urine MODERATE (*) NEGATIVE   Ketones, ur 15 (*) NEGATIVE mg/dL   Protein, ur NEGATIVE  NEGATIVE mg/dL   Urobilinogen, UA 1.0  0.0 - 1.0 mg/dL   Nitrite POSITIVE (*) NEGATIVE   Leukocytes, UA LARGE (*) NEGATIVE   WBC, UA 21-50  <3 WBC/hpf   Bacteria, UA MANY (*) RARE   Squamous Epithelial / LPF RARE  RARE   Casts HYALINE CASTS (*) NEGATIVE  CBC WITH DIFFERENTIAL      Result Value Range   WBC 6.1  4.0 - 10.5 K/uL   RBC 4.36  3.87 - 5.11 MIL/uL   Hemoglobin 13.2  12.0 - 15.0 g/dL   HCT 16.1  09.6 - 04.5 %   MCV 87.4  78.0 - 100.0 fL   MCH 30.3  26.0 - 34.0 pg   MCHC 34.6  30.0 - 36.0 g/dL   RDW 40.9 (*) 81.1 - 91.4 %   Platelets 155  150 - 400 K/uL   Neutrophils Relative % 77  43 - 77 %   Neutro Abs 4.7  1.7 - 7.7 K/uL   Lymphocytes Relative 13  12 - 46 %    Lymphs Abs 0.8  0.7 - 4.0 K/uL   Monocytes Relative 8  3 - 12 %   Monocytes Absolute 0.5  0.1 - 1.0 K/uL   Eosinophils Relative 2  0 - 5 %   Eosinophils Absolute 0.1  0.0 - 0.7 K/uL   Basophils Relative 0  0 - 1 %   Basophils Absolute 0.0  0.0 - 0.1 K/uL  COMPREHENSIVE METABOLIC PANEL      Result Value Range   Sodium 141  137 - 147 mEq/L   Potassium 3.0 (*) 3.7 - 5.3 mEq/L   Chloride 98  96 - 112 mEq/L   CO2 26  19 - 32 mEq/L   Glucose, Bld 124 (*) 70 - 99 mg/dL   BUN 28 (*) 6 - 23 mg/dL   Creatinine, Ser 7.82 (*) 0.50 - 1.10 mg/dL   Calcium 8.8  8.4 - 95.6 mg/dL   Total Protein 5.2 (*) 6.0 - 8.3 g/dL   Albumin 3.0 (*) 3.5 - 5.2 g/dL   AST 33  0 - 37 U/L   ALT 22  0 - 35 U/L   Alkaline Phosphatase 68  39 - 117 U/L   Total Bilirubin 1.1  0.3 - 1.2 mg/dL   GFR calc non Af Amer 29 (*) >90 mL/min   GFR calc Af Amer 34 (*) >90 mL/min  LIPASE, BLOOD      Result Value Range   Lipase 29  11 - 59 U/L  TROPONIN I      Result Value Range   Troponin I <0.30  <0.30 ng/mL  CG4 I-STAT (  LACTIC ACID)      Result Value Range   Lactic Acid, Venous 2.10  0.5 - 2.2 mmol/L   Ct Abdomen Pelvis Wo Contrast 05/15/2013   CLINICAL DATA:  Nausea, vomiting and diarrhea.  EXAM: CT ABDOMEN AND PELVIS WITHOUT CONTRAST  TECHNIQUE: Multidetector CT imaging of the abdomen and pelvis was performed following the standard protocol without intravenous contrast.  COMPARISON:  DG UGI W/KUB dated 04/22/2013; CT CTA ABD/PEL W/CM AND/OR W/O CM dated 11/06/2012  FINDINGS: Lung bases show subpleural reticulation. Heart is at the upper limits of normal in size. No pericardial or pleural effusion.  Liver is unremarkable. Cholecystectomy. Adrenal glands, kidneys, spleen, pancreas, stomach and small bowel are unremarkable. Apparent wall thickening involving the transverse colon may be due to underdistention. Colon is otherwise unremarkable. Air in the bladder is presumably iatrogenic. Uterus and ovaries are visualized. Trace  pelvic free fluid. Atherosclerotic calcification of the arterial vasculature without abdominal aortic aneurysm. No pathologically enlarged lymph nodes. No worrisome lytic or sclerotic lesions. Degenerative changes are seen in the spine. Grade 1 anterolisthesis of L4 on L5, as before.  IMPRESSION: No acute findings to explain the patient's given symptoms. The appearance of slight wall thickening involving the transverse colon is likely due to underdistention.   Electronically Signed   By: Leanna BattlesMelinda  Blietz M.D.   On: 05/15/2013 14:37   Dg Chest 2 View 05/15/2013   CLINICAL DATA:  Diarrhea with vomiting and weakness.  EXAM: CHEST  2 VIEW  COMPARISON:  12/28/2012.  FINDINGS: Two views study shows no focal airspace consolidation. No pulmonary edema. Interstitial markings are diffusely coarsened with chronic features. The cardio pericardial silhouette is enlarged. Patient is status post aortic valve replacement. Telemetry leads overlie the chest.  IMPRESSION: No active cardiopulmonary disease.   Electronically Signed   By: Kennith CenterEric  Mansell M.D.   On: 05/15/2013 12:09   Dg Ugi W/kub 04/22/2013   CLINICAL DATA:  Nausea and vomiting, weight loss.  EXAM: UPPER GI SERIES W/ KUB  TECHNIQUE: After obtaining a scout radiograph a routine upper GI series was performed using thin barium.  COMPARISON:  None.  FLUOROSCOPY TIME:  1 min 42 seconds.  FINDINGS: Scout view of the abdomen shows a normal bowel gas pattern. Surgical clips in the right upper quadrant. Vascular calcifications. Degenerative changes in the spine.  A limited examination was performed due to patient condition. Esophageal motility is normal. No esophageal fold thickening. Although a discrete stricture is not identified, a 13 mm barium pill would not pass beyond the distal esophagus, into the stomach. Stomach is suboptimally distended as patient was unable to consume an adequate amount of barium. Stomach and duodenum bulb are grossly unremarkable.  IMPRESSION: 1.  Limited examination due to patient condition, as discussed above. 2. Although a discrete stricture is not identified, a 13 mm barium pill would not pass beyond the gastroesophageal junction.   Electronically Signed   By: Leanna BattlesMelinda  Blietz M.D.   On: 04/22/2013 09:49    Results for Honor JunesSPENCER, Lindalee W (MRN [id number]) as of 05/15/2013 15:19  Ref. Range 11/29/2012 04:20 11/30/2012 04:45 04/02/2013 13:57 05/15/2013 11:40  BUN Latest Range: 6-23 mg/dL 18 19 19 28  (H)  Creatinine Latest Range: 0.50-1.10 mg/dL 6.570.96 8.461.01 1.0 9.621.55 (H)    1500:  Pt orthostatic on VS. Judicious IVF given with improvement in SBP from 90 to 110. +UTI, UC pending; will tx IV rocephin. Potassium repleted IV. BUN/Cr elevated from baseline; will continue IVF. [facility], case discussed,  including:  HPI, pertinent PM/SHx, VS/PE, dx testing, ED course and treatment:  Agreeable to admit, requests they will come to ED for eval to admit.    Laray Anger, DO 05/17/13 1545

## 2013-05-15 NOTE — ED Notes (Signed)
Attempted to call report

## 2013-05-15 NOTE — H&P (Addendum)
[facility] Admission Note Date: 05/15/2013  Patient name: Kaylee Schmitt Medical record number: [id number] Date of birth: 30-Dec-1923 Age: 80 y.o. Gender: female PCP: Quintella Reichert, MD  Medical Service: Internal Medicine Teaching Service   Attending physician: Daiva Eves      Chief Complaint: nausea/vomiting  History of Present Illness:  Kaylee Schmitt is a 80 yr old woman with a history of aortic stenosis (s/p TAVR in 11/2012), diastolic CHF, CAD, CVD, and CKD who was brought to the ED by her daughter after she was found partially unresponsive on the floor of her assisted living facility. The patient reports intractable nausea, vomiting, and diarrhea since her TAVR in August of 2014 that has limited her ability to eat to drink. She endorses constant nausea that is worse after eating or drinking, with no relieving factors. She can only take sips of liquids and has not eaten solid food since before Christmas. She has lost 30lbs since November. Her vomit in the morning typically consists of any food she ate the night before in addition to green bile and black liquid. She sees a GI doctor and underwent an endoscopy last week which was unremarkable. She is unsure if she has had a colonoscopy. She endorses chronic abdominal pain across her entire abdomen but more severe on the right. She states she has had decreased urine output and only urinates twice a day. She denies any dysuria or hematuria. She occasionally has swelling in her ankles and endorses chronic SOB that is at baseline. She has chronic pain in her upper back that is unchanged. She also reports macular degeneration in her L eye and significant hearing loss in both ears requiring a hearing aid. She states she has chest pain, but points to her abdomen when asked to localize it. She has been staying in bed most of the day over the past couple weeks due to nausea and weakness. She has not been taking her prescribed potassium pill because she  does not like swallowing it.   She is able to bathe and dress herself, however she often has assistance from her assisted living facility. Her sons report multiple falls recently and they believe she should not be bathing without assistance. Her daughter manages her finances. Her sons wonder if she depressed due to many recent deaths of close family members and friends. The patient states she is ready to die.   Meds: Current Outpatient Rx  Name  Route  Sig  Dispense  Refill  . amLODipine (NORVASC) 10 MG tablet   Oral   Take 1 tablet (10 mg total) by mouth every evening.   30 tablet   3   . aspirin EC 81 MG tablet   Oral   Take 81 mg by mouth every morning.         Marland Kitchen atenolol (TENORMIN) 25 MG tablet   Oral   Take 25 mg by mouth daily.         Marland Kitchen atorvastatin (LIPITOR) 10 MG tablet   Oral   Take 10 mg by mouth daily.         . clopidogrel (PLAVIX) 75 MG tablet   Oral   Take 1 tablet (75 mg total) by mouth daily with breakfast.   30 tablet   3   . furosemide (LASIX) 40 MG tablet   Oral   Take 40 mg by mouth daily.          . hydrALAZINE (APRESOLINE) 50 MG tablet   Oral  Take 1 tablet (50 mg total) by mouth every 8 (eight) hours.   90 tablet   3   . isosorbide mononitrate (IMDUR) 60 MG 24 hr tablet   Oral   Take 1 tablet (60 mg total) by mouth daily.   30 tablet   3   . omeprazole (PRILOSEC) 20 MG capsule   Oral   Take 20 mg by mouth 2 (two) times daily before a meal.          . PHENADOZ 25 MG suppository   Rectal   Place 25 mg rectally every 6 (six) hours as needed.         . potassium chloride SA (K-DUR,KLOR-CON) 20 MEQ tablet   Oral   Take 40 mEq by mouth daily.         . sertraline (ZOLOFT) 100 MG tablet   Oral   Take 100 mg by mouth every morning.          . sucralfate (CARAFATE) 1 GM/10ML suspension   Oral   Take 1 g by mouth 4 (four) times daily -  with meals and at bedtime.         . valsartan (DIOVAN) 320 MG tablet   Oral    Take 1 tablet (320 mg total) by mouth every morning.   30 tablet   11     Allergies: Allergies as of 05/15/2013 - Review Complete 05/15/2013  Allergen Reaction Noted  . Ramipril Cough 03/13/2013  . Adhesive [tape] Rash 11/21/2012  . Penicillins Itching and Rash 10/12/2012  . Sulfa antibiotics Itching and Rash 10/12/2012   Past Medical History  Diagnosis Date  . Aortic stenosis   . CAD (coronary artery disease) 10/12/2012    Cath 2011  Calcified LM,  80% mid LAD, dominant circ without sig disease, Ostial nondominant RCA.  Severe AS  Liberte stent x 2 2011 Dr. Amil Amen    . History of CVA (cerebrovascular accident)     2003 while living in [place].   Right brain with left hemiparesis   . Gout 10/12/2012  . Hypertensive heart disease 10/12/2012  . Hypertension   . Chronic kidney disease   . Shortness of breath   . Stroke   . S/P TAVR (transcatheter aortic valve replacement) 11/27/2012    Stephannie Peters XT transcatheter heart valve (size 23mm) placed via open left transfemoral approach  . Hyperlipidemia 10/12/2012    intolerant to statins  . Carotid artery occlusion     moderate bilaterally  . Chronic diastolic CHF (congestive heart failure)    Past Surgical History  Procedure Laterality Date  . Coronary stent placement    . Cholecystectomy    . Appendectomy    . Cataract extraction    . Cardiac catheterization    . Transcatheter aortic valve replacement, transfemoral N/A 11/27/2012    Procedure: TRANSCATHETER AORTIC VALVE REPLACEMENT, TRANSFEMORAL;  Surgeon: Tonny Bollman, MD;  Location: [facility] OR;  Service: Cardiovascular;  Laterality: N/A;  . Intraoperative transesophageal echocardiogram N/A 11/27/2012    Procedure: INTRAOPERATIVE TRANSESOPHAGEAL ECHOCARDIOGRAM;  Surgeon: Tonny Bollman, MD;  Location[facility] OR;  Service: Cardiovascular;  Laterality: N/A;   Family History  Problem Relation Age of Onset  . Cancer - Prostate Father   . CAD Mother   . CVA Sister   . Brain cancer Sister   .  COPD Sister    History   Social History  . Marital Status: Widowed    Spouse Name: N/A    Number of Children:  8  . Years of Education: N/A   Occupational History  . Not on file.   Social History Main Topics  . Smoking status: Never Smoker   . Smokeless tobacco: Not on file  . Alcohol Use: Yes     Comment: occasionally  . Drug Use: No  . Sexual Activity: Not on file   Other Topics Concern  . Not on file   Social History Narrative   Widow.  Lives with daughter.    Review of Systems: Constitutional: +anorexia, +chronic cold extremities, denies fevers Eyes: +macular degeneration, R eye Ears, nose, mouth, throat, and face: +hearing loss, Denies sore throat, rhinorrea Respiratory: +SOB Cardiovascular: Denies chest pain Gastrointestinal: Per HPI Genitourinary: Per HPI Musculoskeletal: +upper back pain Neurological: +L sided lower extremity weakness from prior CVA  Physical Exam: Blood pressure 128/96, pulse 67, temperature 97.4 F (36.3 C), resp. rate 19, SpO2 99.00%. General appearance: alert, in no acute distress, pale Eyes: conjunctivae/corneas clear. PERRL, EOM's intact Throat: dry mucus membranes, lips, mucosa, and tongue normal Lungs: clear to auscultation bilaterally Heart: regular rate and rhythm, S1, S2 normal, no murmur, click, rub or gallop Abdomen: soft, nondistended tender to palpation in all 4 quadrants, most significant in RLQ. Hypoactive bowel sounds, no masses  Extremities: cold, no evidence of venous stasis  Pulses: 1+ DP pulses bilaterally  Neurologic: CNs grossly intact   Lab results: Results for orders placed during the hospital encounter of 05/15/13  URINALYSIS W MICROSCOPIC + REFLEX CULTURE      Result Value Range   Color, Urine YELLOW  YELLOW   APPearance CLOUDY (*) CLEAR   Specific Gravity, Urine 1.020  1.005 - 1.030   pH 5.5  5.0 - 8.0   Glucose, UA NEGATIVE  NEGATIVE mg/dL   Hgb urine dipstick NEGATIVE  NEGATIVE   Bilirubin Urine  MODERATE (*) NEGATIVE   Ketones, ur 15 (*) NEGATIVE mg/dL   Protein, ur NEGATIVE  NEGATIVE mg/dL   Urobilinogen, UA 1.0  0.0 - 1.0 mg/dL   Nitrite POSITIVE (*) NEGATIVE   Leukocytes, UA LARGE (*) NEGATIVE   WBC, UA 21-50  <3 WBC/hpf   Bacteria, UA MANY (*) RARE   Squamous Epithelial / LPF RARE  RARE   Casts HYALINE CASTS (*) NEGATIVE  CBC WITH DIFFERENTIAL      Result Value Range   WBC 6.1  4.0 - 10.5 K/uL   RBC 4.36  3.87 - 5.11 MIL/uL   Hemoglobin 13.2  12.0 - 15.0 g/dL   HCT 16.138.1  09.636.0 - 04.546.0 %   MCV 87.4  78.0 - 100.0 fL   MCH 30.3  26.0 - 34.0 pg   MCHC 34.6  30.0 - 36.0 g/dL   RDW 40.917.6 (*) 81.111.5 - 91.415.5 %   Platelets 155  150 - 400 K/uL   Neutrophils Relative % 77  43 - 77 %   Neutro Abs 4.7  1.7 - 7.7 K/uL   Lymphocytes Relative 13  12 - 46 %   Lymphs Abs 0.8  0.7 - 4.0 K/uL   Monocytes Relative 8  3 - 12 %   Monocytes Absolute 0.5  0.1 - 1.0 K/uL   Eosinophils Relative 2  0 - 5 %   Eosinophils Absolute 0.1  0.0 - 0.7 K/uL   Basophils Relative 0  0 - 1 %   Basophils Absolute 0.0  0.0 - 0.1 K/uL  COMPREHENSIVE METABOLIC PANEL      Result Value Range   Sodium 141  137 -  147 mEq/L   Potassium 3.0 (*) 3.7 - 5.3 mEq/L   Chloride 98  96 - 112 mEq/L   CO2 26  19 - 32 mEq/L   Glucose, Bld 124 (*) 70 - 99 mg/dL   BUN 28 (*) 6 - 23 mg/dL   Creatinine, Ser 1.61 (*) 0.50 - 1.10 mg/dL   Calcium 8.8  8.4 - 09.6 mg/dL   Total Protein 5.2 (*) 6.0 - 8.3 g/dL   Albumin 3.0 (*) 3.5 - 5.2 g/dL   AST 33  0 - 37 U/L   ALT 22  0 - 35 U/L   Alkaline Phosphatase 68  39 - 117 U/L   Total Bilirubin 1.1  0.3 - 1.2 mg/dL   GFR calc non Af Amer 29 (*) >90 mL/min   GFR calc Af Amer 34 (*) >90 mL/min  LIPASE, BLOOD      Result Value Range   Lipase 29  11 - 59 U/L  TROPONIN I      Result Value Range   Troponin I <0.30  <0.30 ng/mL  CG4 I-STAT (LACTIC ACID)      Result Value Range   Lactic Acid, Venous 2.10  0.5 - 2.2 mmol/L    Imaging results:  Ct Abdomen Pelvis Wo  Contrast  05/15/2013   CLINICAL DATA:  Nausea, vomiting and diarrhea.  EXAM: CT ABDOMEN AND PELVIS WITHOUT CONTRAST  TECHNIQUE: Multidetector CT imaging of the abdomen and pelvis was performed following the standard protocol without intravenous contrast.  COMPARISON:  DG UGI W/KUB dated 04/22/2013; CT CTA ABD/PEL W/CM AND/OR W/O CM dated 11/06/2012  FINDINGS: Lung bases show subpleural reticulation. Heart is at the upper limits of normal in size. No pericardial or pleural effusion.  Liver is unremarkable. Cholecystectomy. Adrenal glands, kidneys, spleen, pancreas, stomach and small bowel are unremarkable. Apparent wall thickening involving the transverse colon may be due to underdistention. Colon is otherwise unremarkable. Air in the bladder is presumably iatrogenic. Uterus and ovaries are visualized. Trace pelvic free fluid. Atherosclerotic calcification of the arterial vasculature without abdominal aortic aneurysm. No pathologically enlarged lymph nodes. No worrisome lytic or sclerotic lesions. Degenerative changes are seen in the spine. Grade 1 anterolisthesis of L4 on L5, as before.  IMPRESSION: No acute findings to explain the patient's given symptoms. The appearance of slight wall thickening involving the transverse colon is likely due to underdistention.   Electronically Signed   By: Leanna Battles M.D.   On: 05/15/2013 14:37   Dg Chest 2 View  05/15/2013   CLINICAL DATA:  Diarrhea with vomiting and weakness.  EXAM: CHEST  2 VIEW  COMPARISON:  12/28/2012.  FINDINGS: Two views study shows no focal airspace consolidation. No pulmonary edema. Interstitial markings are diffusely coarsened with chronic features. The cardio pericardial silhouette is enlarged. Patient is status post aortic valve replacement. Telemetry leads overlie the chest.  IMPRESSION: No active cardiopulmonary disease.   Electronically Signed   By: Kennith Center M.D.   On: 05/15/2013 12:09    Other results: EKG: normal rhythm, LAD,  Ventricular trigeminy, RBBB and LAFB, unchanged from prior. No ST abnormalities.   Assessment & Plan by Problem: Active Problems:   Acute renal failure  #acute on chronic kidney disease. Patient with baseline creatinine 1.0, now 1.55 on admission. Likely prerenal due to dehydration from decreased PO intake of fluids. Hyaline casts on urine microscopy further supports prerenal and argue against intrinsic renal causes. Unlikely to be postrenal, as patient is urinating twice daily.  -NS  53mL/hr -repeat BMP -hold nephrotoxic agents including valsartan  -renally dose medications   #UTI. U/A with +nitrite, large LE, and many bacteria. Patient currently asymptomatic, although could be atypical presentation due to patient's age and possible altered mental status this morning.  -continue ceftriaxone  -f/u urine culture  #Chronic nausea/vomiting. N/V first began after aortic valve repair. Currently followed by GI. CT abdomen with no acute findings to explain symptoms. Could be a result of new medications began after surgery, however need further records to determine when medications were started. Possible psychosocial component, as patient seems to be depressed currently. Unlikely viral illness due to chronic nature of the problem as well as normal WBC count. Unlikely mechanical obstruction with normal CT scan.  -zofran q1 PRN -omeprazole 20mg  BID  -clear liquid diet  -obtain medical records, consider medication adjustment   #hypokalemia - K+ 3.0 on admission. Patient has not been compliant with home potassium supplementation - does not like swallowing the pill. Diarrhea could also be a contributing factor with loss of K+ in stool. Lasix could also exacerbate hypokalemia.  - IV K+   #chronic diastolic CHF. No evidence of pulmonary edema on exam or CXR. Patient appears euvolemic.  - holding home meds due to volume depletion and hypotension.   #HTN - BP 116/52 lying and 93/42 standing,  consistent with orthostasis due to volume depletion. - holding home amlodipine, atenolol, furosemide, hydralazine due to volume depletion and orthostatic hypotension  #CAD. Troponins <0.3 on admission. EKG shows sinus rhythm and RBBB, unchanged from prior. No ST abnormalities.  -continue home Lipitor 10mg  qd  #Depression - mood seems depressed, however unsure of baseline. Patient states she is ready to die.  -continue home zoloft  This is a Psychologist, occupational Note.  The care of the patient was discussed with Dr. Everardo Beals and the assessment and plan was formulated with their assistance.  Please see their note for official documentation of the patient encounter.   SignedGinger Carne 05/15/2013, 4:55 PM

## 2013-05-16 DIAGNOSIS — E40 Kwashiorkor: Secondary | ICD-10-CM

## 2013-05-16 DIAGNOSIS — E43 Unspecified severe protein-calorie malnutrition: Secondary | ICD-10-CM | POA: Diagnosis present

## 2013-05-16 LAB — BASIC METABOLIC PANEL
BUN: 26 mg/dL — AB (ref 6–23)
CHLORIDE: 101 meq/L (ref 96–112)
CO2: 21 mEq/L (ref 19–32)
Calcium: 7.8 mg/dL — ABNORMAL LOW (ref 8.4–10.5)
Creatinine, Ser: 1.35 mg/dL — ABNORMAL HIGH (ref 0.50–1.10)
GFR calc Af Amer: 40 mL/min — ABNORMAL LOW (ref >90)
GFR calc non Af Amer: 34 mL/min — ABNORMAL LOW (ref >90)
GLUCOSE: 68 mg/dL — AB (ref 70–99)
POTASSIUM: 4.2 meq/L (ref 3.7–5.3)
Sodium: 138 mEq/L (ref 137–147)

## 2013-05-16 LAB — URINE CULTURE

## 2013-05-16 LAB — TSH: TSH: 4.503 u[IU]/mL — ABNORMAL HIGH (ref 0.350–4.500)

## 2013-05-16 LAB — MRSA PCR SCREENING: MRSA by PCR: NEGATIVE

## 2013-05-16 LAB — GLUCOSE, CAPILLARY
Glucose-Capillary: 62 mg/dL — ABNORMAL LOW (ref 70–99)
Glucose-Capillary: 72 mg/dL (ref 70–99)

## 2013-05-16 LAB — T4, FREE: Free T4: 0.99 ng/dL (ref 0.80–1.80)

## 2013-05-16 MED ORDER — BOOST / RESOURCE BREEZE PO LIQD
1.0000 | ORAL | Status: DC
  Administered 2013-05-17: 1 via ORAL

## 2013-05-16 MED ORDER — SUCRALFATE 1 GM/10ML PO SUSP
1.0000 g | Freq: Two times a day (BID) | ORAL | Status: DC
  Administered 2013-05-16 – 2013-05-19 (×6): 1 g via ORAL
  Filled 2013-05-16 (×10): qty 10

## 2013-05-16 NOTE — Progress Notes (Signed)
Patient urinary urgency when needing to use the bedside commode. The linen and sheets were already wet by the time staff arrived to help patient to bedside commode. Patient still wanted to use bedside commode even after voiding in bed for when she stood up from the bed some diarrhea occurred on the way to the bedside commode. The appearance and consistency of stool looked like C-Diff and therefore patient put on enteric precautions to rule out C-Diff. Once patient made it to bedside commode, patient had only a small amount of diarrhea that it was not enough to send for a stool sample. Will try to achieve a stool sample if patient has another occurrence of stool. Just for informational purposes, the patient's urine is malodorous but has already had a U/A sent and was negative, urine culture still pending. Also MRSA swab performed due to patient coming from a SNF. Patient is now back to bed and is resting. Will continue to monitor patient to end of shift.

## 2013-05-16 NOTE — H&P (Signed)
I repeated the critical or key portions of the exam.  I confirmed/revised the medical student's history, exam, assessment and plan.   

## 2013-05-16 NOTE — Progress Notes (Addendum)
Subjective:   Pt complains of feeling nauseated this AM but has not vomited.  She ate a few bites of her breakfast and was nauseated.  Had 1 episode of hypoglycemia overnight with glucose of 62, treated with 15 GM carb snack and 15 GM gel. Also had 16 beats of wide QRS w/ 1st degree AV block, no V-Tach.    Objective:   Vital signs in last 24 hours: Filed Vitals:   05/15/13 1733 05/15/13 2027 05/16/13 0153 05/16/13 0641  BP: 134/36 128/46 133/67 158/29  Pulse: 60 69 57 61  Temp: 97.6 F (36.4 C) 97.8 F (36.6 C) 98 F (36.7 C) 98 F (36.7 C)  TempSrc: Oral  Oral Oral  Resp: 18 20 18 18   Height: 5' (1.524 m)     Weight: 108 lb 7.5 oz (49.2 kg)   112 lb 12.8 oz (51.166 kg)  SpO2: 98% 99% 99% 100%   Weight change:   Intake/Output Summary (Last 24 hours) at 05/16/13 1123 Last data filed at 05/16/13 0900  Gross per 24 hour  Intake    470 ml  Output      0 ml  Net    470 ml    Physical Exam: Constitutional: Vital signs reviewed.  Patient is in no acute distress and cooperative with exam.   Head: Normocephalic and atraumatic Eyes: EOMI, pale conjunctivae, no scleral icterus  Neck: Supple, Trachea midline Cardiovascular: RRR, S1, S2 present, no MRG, DP 2+ b/l Pulmonary/Chest: normal respiratory effort, CTAB, no wheezes, rales, or rhonchi Abdominal: Soft. +BS, +epigastric pain, non-distended Neurological: A&O x3, cranial nerve II-XII are grossly intact, moving all extremities  Skin: Warm, dry and intact. No rash Psychiatric: Normal mood and affect.   Lab Results:  BMP:  Recent Labs Lab 05/15/13 1140 05/15/13 1854 05/16/13 0343  NA 141  --  138  K 3.0*  --  4.2  CL 98  --  101  CO2 26  --  21  GLUCOSE 124*  --  68*  BUN 28*  --  26*  CREATININE 1.55*  --  1.35*  CALCIUM 8.8  --  7.8*  MG  --  1.6  --   PHOS  --  3.1  --     CBC:  Recent Labs Lab 05/15/13 1140  WBC 6.1  NEUTROABS 4.7  HGB 13.2  HCT 38.1  MCV 87.4  PLT 155    Coagulation: No  results found for this basename: LABPROT, INR,  in the last 168 hours  CBG:            Recent Labs Lab 05/16/13 0621 05/16/13 0648  GLUCAP 62* 72           HA1C:      No results found for this basename: HGBA1C,  in the last 168 hours  Lipid Panel: No results found for this basename: CHOL, HDL, LDLCALC, TRIG, CHOLHDL, LDLDIRECT,  in the last 168 hours  LFTs:  Recent Labs Lab 05/15/13 1140  AST 33  ALT 22  ALKPHOS 68  BILITOT 1.1  PROT 5.2*  ALBUMIN 3.0*    Pancreatic Enzymes:  Recent Labs Lab 05/15/13 1140  LIPASE 29    Ammonia: No results found for this basename: AMMONIA,  in the last 168 hours  Cardiac Enzymes:  Recent Labs Lab 05/15/13 1140  TROPONINI <0.30   Lab Results  Component Value Date   CKTOTAL 52 01/25/2010   CKMB 0.6 01/25/2010   TROPONINI <0.30 05/15/2013  BNP: No results found for this basename: PROBNP,  in the last 168 hours  D-Dimer: No results found for this basename: DDIMER,  in the last 168 hours  Urinalysis:  Recent Labs Lab 05/15/13 1442  COLORURINE YELLOW  LABSPEC 1.020  PHURINE 5.5  GLUCOSEU NEGATIVE  HGBUR NEGATIVE  BILIRUBINUR MODERATE*  KETONESUR 15*  PROTEINUR NEGATIVE  UROBILINOGEN 1.0  NITRITE POSITIVE*  LEUKOCYTESUR LARGE*    Micro Results: Recent Results (from the past 240 hour(s))  URINE CULTURE     Status: None   Collection Time    05/15/13  2:42 PM      Result Value Range Status   Specimen Description URINE, CATHETERIZED   Final   Special Requests NONE   Final   Culture  Setup Time     Final   Value: 05/15/2013 22:25     Performed at [facility] Count     Final   Value: >=100,000 COLONIES/ML     Performed at [facility]   Culture     Final   Value: ESCHERICHIA COLI     Performed at [facility]   Report Status PENDING   Incomplete  MRSA PCR SCREENING     Status: None   Collection Time    05/16/13 12:38 AM      Result Value Range Status   MRSA by PCR  NEGATIVE  NEGATIVE Final   Comment:            The GeneXpert MRSA Assay (FDA     approved for NASAL specimens     only), is one component of a     comprehensive MRSA colonization     surveillance program. It is not     intended to diagnose MRSA     infection nor to guide or     monitor treatment for     MRSA infections.    Blood Culture:    Component Value Date/Time   SDES URINE, CATHETERIZED 05/15/2013 1442   SPECREQUEST NONE 05/15/2013 1442   CULT  Value: ESCHERICHIA COLI Performed at [facility] 05/15/2013 1442   REPTSTATUS PENDING 05/15/2013 1442    Studies/Results: Ct Abdomen Pelvis Wo Contrast  05/15/2013   CLINICAL DATA:  Nausea, vomiting and diarrhea.  EXAM: CT ABDOMEN AND PELVIS WITHOUT CONTRAST  TECHNIQUE: Multidetector CT imaging of the abdomen and pelvis was performed following the standard protocol without intravenous contrast.  COMPARISON:  DG UGI W/KUB dated 04/22/2013; CT CTA ABD/PEL W/CM AND/OR W/O CM dated 11/06/2012  FINDINGS: Lung bases show subpleural reticulation. Heart is at the upper limits of normal in size. No pericardial or pleural effusion.  Liver is unremarkable. Cholecystectomy. Adrenal glands, kidneys, spleen, pancreas, stomach and small bowel are unremarkable. Apparent wall thickening involving the transverse colon may be due to underdistention. Colon is otherwise unremarkable. Air in the bladder is presumably iatrogenic. Uterus and ovaries are visualized. Trace pelvic free fluid. Atherosclerotic calcification of the arterial vasculature without abdominal aortic aneurysm. No pathologically enlarged lymph nodes. No worrisome lytic or sclerotic lesions. Degenerative changes are seen in the spine. Grade 1 anterolisthesis of L4 on L5, as before.  IMPRESSION: No acute findings to explain the patient's given symptoms. The appearance of slight wall thickening involving the transverse colon is likely due to underdistention.   Electronically Signed   By: Leanna Battles  M.D.   On: 05/15/2013 14:37   Dg Chest 2 View  05/15/2013   CLINICAL DATA:  Diarrhea  with vomiting and weakness.  EXAM: CHEST  2 VIEW  COMPARISON:  12/28/2012.  FINDINGS: Two views study shows no focal airspace consolidation. No pulmonary edema. Interstitial markings are diffusely coarsened with chronic features. The cardio pericardial silhouette is enlarged. Patient is status post aortic valve replacement. Telemetry leads overlie the chest.  IMPRESSION: No active cardiopulmonary disease.   Electronically Signed   By: Kennith Center M.D.   On: 05/15/2013 12:09    Medications:  Scheduled Meds: . aspirin EC  81 mg Oral q morning - 10a  . atorvastatin  10 mg Oral Daily  . clopidogrel  75 mg Oral Q breakfast  . [START ON 05/17/2013] feeding supplement (RESOURCE BREEZE)  1 Container Oral Q24H  . heparin  5,000 Units Subcutaneous Q8H  . pantoprazole  40 mg Oral Daily  . sertraline  100 mg Oral q morning - 10a   Continuous Infusions: . sodium chloride 75 mL/hr (05/15/13 2356)   PRN Meds:.  Antibiotics: Anti-infectives   Start     Dose/Rate Route Frequency Ordered Stop   05/15/13 1530  cefTRIAXone (ROCEPHIN) 1 g in dextrose 5 % 50 mL IVPB     1 g 100 mL/hr over 30 Minutes Intravenous  Once 05/15/13 1518 05/15/13 1856     Antibiotics Given (last 72 hours)   None      Day of Hospitalization:  1 Consults:    Assessment/Plan:   Active Problems:   Acute renal failure   Protein-calorie malnutrition, severe  #Chronic nausea/vomiting/diarrhea - Pt reports feeling nauseated this AM and is not feeling well.  Pt had esophageal stricture dilation on 05/09/13.  EGD revealed benign appearing intrinsic stenosis with normal stomach and duodenum.  GI recommended mechanical soft diet and return to office PRN.  -mechanical soft diet  -continue 45ml/hr NS  -continue PPI (protonix); pt on prilosec bid at home  -strict documentation of any episodes of vomiting/diarrhea  -appreciate nutrition consult  recs -obtain records from [facility]  #Acute on CKD3 - Creatinine is trending down today from yesterday.   -continue NS 17ml/hr  -Mg and phos wnl  -strict I/Os  -TSH elevated; order FT4 -avoid nephrotoxic meds  -BMP in AM -consider renal US if not improving with IVF   Lab Results  Component Value Date   CREATININE 1.35* 05/16/2013    #Asymptomatic bacteriuria - Pt without urinary symptoms however, UA revealed leukocytes and nitrites.  -d/c ceftriaxone as pt is asymptomatic and no indication to treat  #HTN - Pt orthostatic on exam.  Goal BP is <140/90. -hold BP meds  -continue IVF  #Chronic diastolic HF - Pt does not appear volume overloaded on exam  -d/c lasix  -holding BP meds given hypotension  -daily weights  -strict I/Os  #Protein Calorie Malnutrition, Severe -  -appreciate nutrition recs -added nutritional shake supplements to diet   #FEN- NS-74ml/hr  Electrolytes-Replete as needed Diet-Clear liquids    #Dispo- Disposition is deferred at this time, awaiting improvement of current medical problems.  Anticipated discharge in approximately 1-2 day(s).    LOS: 1 day   Boykin Peek, MD PGY-1, Internal Medicine  [phone] (7AM-5PM Mon-Fri) 05/16/2013, 11:23 AM

## 2013-05-16 NOTE — Evaluation (Signed)
Physical Therapy Evaluation Patient Details Name: Kaylee Schmitt MRN: [id number] DOB: [birth year] Today's Date: 05/16/2013 Time: 1510-1540 PT Time Calculation (min): 30 min  PT Assessment / Plan / Recommendation History of Present Illness  Pt admit with acute renal failure and UTI.    Clinical Impression  Pt admitted with above. Pt currently with functional limitations due to the deficits listed below (see PT Problem List).Pt may need NHP for therapy prior to return to A living to incr strength.  Pt weakened at present.   Pt will benefit from skilled PT to increase their independence and safety with mobility to allow discharge to the venue listed below.     PT Assessment  Patient needs continued PT services    Follow Up Recommendations  Supervision/Assistance - 24 hour;SNF       Barriers to Discharge Decreased caregiver support      Equipment Recommendations  None recommended by PT         Frequency Min 3X/week    Precautions / Restrictions Precautions Precautions: Fall Restrictions Weight Bearing Restrictions: No   Pertinent Vitals/Pain VSS, no pain.      Mobility  Bed Mobility Overal bed mobility: Needs Assistance Bed Mobility: Supine to Sit Supine to sit: Mod assist General bed mobility comments: Pt needed assist for LEs and elevation of trunk.  Incr time needed as well.  Transfers Overall transfer level: Needs assistance Equipment used: 2 person hand held assist Transfers: Sit to/from [employer] Sit to Stand: Mod assist Stand pivot transfers: Min assist General transfer comment: Pt needed UE support throughout transitional movements.  Pt needed assist with anterior lean as well.  Pt did not stand fully upright as her trunk, hips and knees remained slightly flexed with transfer to chair.  Was able to take pivotal steps around to 3N1.  Pt with watery stool and was unaware of bowel movement.  Cleaned pt once on 3N1 as she had mesh panties with pad and  it was soiled.  her daughter in law had brought Depends therefore placed a Depends on once pt cleaned.  Pt was able to hold onto back of chair so PT could clean her perineal area.  Also put cream on bottom.  Pt was able to step back around to bed from 3N1 with UE support and min assist.           PT Diagnosis: Generalized weakness  PT Problem List: Decreased activity tolerance;Decreased balance;Decreased mobility;Decreased knowledge of use of DME;Decreased safety awareness;Decreased knowledge of precautions PT Treatment Interventions: DME instruction;Gait training;Therapeutic activities;Functional mobility training;Therapeutic exercise;Balance training;Patient/family education     PT Goals(Current goals can be found in the care plan section) Acute Rehab PT Goals Patient Stated Goal: to get therapy and go back to [facility] PT Goal Formulation: With patient Time For Goal Achievement: 05/30/13 Potential to Achieve Goals: Good  Visit Information  Last PT Received On: 05/16/13 Assistance Needed: +1 History of Present Illness: Pt admit with acute renal failure and UTI.         Prior Functioning  Home Living Family/patient expects to be discharged to:: Assisted living Home Equipment: Shower seat;Bedside commode;Wheelchair - manual;Grab bars - toilet;Grab bars - tub/shower;Hand held shower head;Cane - single point;Walker - 4 wheels Additional Comments: Assist with bathing 2x week, dressing assist prn, facility prepares medications and cooks.   Prior Function Level of Independence: Independent with assistive device(s) Comments: uses walker; meals across hall  - doesnt walk long distances. Communication Communication: HOH Dominant Hand:  Right    Cognition  Cognition Arousal/Alertness: Awake/alert Behavior During Therapy: Anxious Overall Cognitive Status: Within Functional Limits for tasks assessed    Extremity/Trunk Assessment Upper Extremity Assessment Upper Extremity  Assessment: Defer to OT evaluation Lower Extremity Assessment Lower Extremity Assessment: Generalized weakness Cervical / Trunk Assessment Cervical / Trunk Assessment: Kyphotic   Balance Balance Overall balance assessment: Needs assistance;History of Falls Sitting-balance support: Bilateral upper extremity supported;Feet supported Sitting balance-Leahy Scale: Fair Postural control: Posterior lean Standing balance support: Bilateral upper extremity supported;During functional activity Standing balance-Leahy Scale: Poor Standing balance comment: Pt able to maintain balance standing statically only with UE support.  If not holding onto chair, pt losing balance all directions but mostly posteriorly.    End of Session PT - End of Session Equipment Utilized During Treatment: Gait belt Activity Tolerance: Patient limited by fatigue Patient left: in bed;with call bell/phone within reach;with family/visitor present Nurse Communication: Mobility status       INGOLD,Coleen Cardiff 05/16/2013, 4:18 PM  [name] CampusDawn Ingold,PT Acute Rehabilitation [phone] (pager)

## 2013-05-16 NOTE — Progress Notes (Signed)
Medical Student Daily Progress Note  Subjective: Experienced 1 episode of hypoglycemia overnight with glucose of 62, treated with 15 GM carbohydrate snack and 15 GM gel. Also had 16 beats of wide QRS w/ 1st degree AV block, no V-Tach.  Ms. Un reports feeling better today overall. She continues to have watery diarrhea, which has been worse over the past 2-3 days. Still with nausea and anorexia.   Objective: Vital signs in last 24 hours: Filed Vitals:   05/15/13 1733 05/15/13 2027 05/16/13 0153 05/16/13 0641  BP: 134/36 128/46 133/67 158/29  Pulse: 60 69 57 61  Temp: 97.6 F (36.4 C) 97.8 F (36.6 C) 98 F (36.7 C) 98 F (36.7 C)  TempSrc: Oral  Oral Oral  Resp: 18 20 18 18   Height: 5' (1.524 m)     Weight: 49.2 kg (108 lb 7.5 oz)   51.166 kg (112 lb 12.8 oz)  SpO2: 98% 99% 99% 100%   Weight change:   Intake/Output Summary (Last 24 hours) at 05/16/13 1007 Last data filed at 05/15/13 1840  Gross per 24 hour  Intake    350 ml  Output      0 ml  Net    350 ml   Physical Exam: General appearance: alert, in no acute distress, pale  Eyes: conjunctivae/corneas clear. PERRL, EOM's intact  Throat: dry mucus membranes, lips, mucosa, and tongue normal  Lungs: clear to auscultation bilaterally  Heart: regular rate and rhythm, S1, S2 normal, no murmur, click, rub or gallop  Abdomen: soft, nondistended tender to palpation in all 4 quadrants, most significant in RLQ/RUQ. Normal bowel sounds, no masses  Extremities: cold, no evidence of venous stasis  Pulses: 1+ DP pulses bilaterally  Neurologic: CNs grossly intact   Lab Results: Results for orders placed during the hospital encounter of 05/15/13  MRSA PCR SCREENING      Result Value Range   MRSA by PCR NEGATIVE  NEGATIVE  URINALYSIS W MICROSCOPIC + REFLEX CULTURE      Result Value Range   Color, Urine YELLOW  YELLOW   APPearance CLOUDY (*) CLEAR   Specific Gravity, Urine 1.020  1.005 - 1.030   pH 5.5  5.0 - 8.0   Glucose,  UA NEGATIVE  NEGATIVE mg/dL   Hgb urine dipstick NEGATIVE  NEGATIVE   Bilirubin Urine MODERATE (*) NEGATIVE   Ketones, ur 15 (*) NEGATIVE mg/dL   Protein, ur NEGATIVE  NEGATIVE mg/dL   Urobilinogen, UA 1.0  0.0 - 1.0 mg/dL   Nitrite POSITIVE (*) NEGATIVE   Leukocytes, UA LARGE (*) NEGATIVE   WBC, UA 21-50  <3 WBC/hpf   Bacteria, UA MANY (*) RARE   Squamous Epithelial / LPF RARE  RARE   Casts HYALINE CASTS (*) NEGATIVE  CBC WITH DIFFERENTIAL      Result Value Range   WBC 6.1  4.0 - 10.5 K/uL   RBC 4.36  3.87 - 5.11 MIL/uL   Hemoglobin 13.2  12.0 - 15.0 g/dL   HCT 10.9  60.4 - 54.0 %   MCV 87.4  78.0 - 100.0 fL   MCH 30.3  26.0 - 34.0 pg   MCHC 34.6  30.0 - 36.0 g/dL   RDW 98.1 (*) 19.1 - 47.8 %   Platelets 155  150 - 400 K/uL   Neutrophils Relative % 77  43 - 77 %   Neutro Abs 4.7  1.7 - 7.7 K/uL   Lymphocytes Relative 13  12 - 46 %   Lymphs  Abs 0.8  0.7 - 4.0 K/uL   Monocytes Relative 8  3 - 12 %   Monocytes Absolute 0.5  0.1 - 1.0 K/uL   Eosinophils Relative 2  0 - 5 %   Eosinophils Absolute 0.1  0.0 - 0.7 K/uL   Basophils Relative 0  0 - 1 %   Basophils Absolute 0.0  0.0 - 0.1 K/uL  COMPREHENSIVE METABOLIC PANEL      Result Value Range   Sodium 141  137 - 147 mEq/L   Potassium 3.0 (*) 3.7 - 5.3 mEq/L   Chloride 98  96 - 112 mEq/L   CO2 26  19 - 32 mEq/L   Glucose, Bld 124 (*) 70 - 99 mg/dL   BUN 28 (*) 6 - 23 mg/dL   Creatinine, Ser 1.47 (*) 0.50 - 1.10 mg/dL   Calcium 8.8  8.4 - 82.9 mg/dL   Total Protein 5.2 (*) 6.0 - 8.3 g/dL   Albumin 3.0 (*) 3.5 - 5.2 g/dL   AST 33  0 - 37 U/L   ALT 22  0 - 35 U/L   Alkaline Phosphatase 68  39 - 117 U/L   Total Bilirubin 1.1  0.3 - 1.2 mg/dL   GFR calc non Af Amer 29 (*) >90 mL/min   GFR calc Af Amer 34 (*) >90 mL/min  LIPASE, BLOOD      Result Value Range   Lipase 29  11 - 59 U/L  TROPONIN I      Result Value Range   Troponin I <0.30  <0.30 ng/mL  BASIC METABOLIC PANEL      Result Value Range   Sodium 138  137 - 147  mEq/L   Potassium 4.2  3.7 - 5.3 mEq/L   Chloride 101  96 - 112 mEq/L   CO2 21  19 - 32 mEq/L   Glucose, Bld 68 (*) 70 - 99 mg/dL   BUN 26 (*) 6 - 23 mg/dL   Creatinine, Ser 5.62 (*) 0.50 - 1.10 mg/dL   Calcium 7.8 (*) 8.4 - 10.5 mg/dL   GFR calc non Af Amer 34 (*) >90 mL/min   GFR calc Af Amer 40 (*) >90 mL/min  TSH      Result Value Range   TSH 4.503 (*) 0.350 - 4.500 uIU/mL  MAGNESIUM      Result Value Range   Magnesium 1.6  1.5 - 2.5 mg/dL  PHOSPHORUS      Result Value Range   Phosphorus 3.1  2.3 - 4.6 mg/dL  GLUCOSE, CAPILLARY      Result Value Range   Glucose-Capillary 62 (*) 70 - 99 mg/dL   Comment 1 Notify RN    GLUCOSE, CAPILLARY      Result Value Range   Glucose-Capillary 72  70 - 99 mg/dL  CG4 I-STAT (LACTIC ACID)      Result Value Range   Lactic Acid, Venous 2.10  0.5 - 2.2 mmol/L   Micro Results: Recent Results (from the past 240 hour(s))  MRSA PCR SCREENING     Status: None   Collection Time    05/16/13 12:38 AM      Result Value Range Status   MRSA by PCR NEGATIVE  NEGATIVE Final   Comment:            The GeneXpert MRSA Assay (FDA     approved for NASAL specimens     only), is one component of a     comprehensive MRSA colonization  surveillance program. It is not     intended to diagnose MRSA     infection nor to guide or     monitor treatment for     MRSA infections.   Studies/Results: Ct Abdomen Pelvis Wo Contrast  05/15/2013   CLINICAL DATA:  Nausea, vomiting and diarrhea.  EXAM: CT ABDOMEN AND PELVIS WITHOUT CONTRAST  TECHNIQUE: Multidetector CT imaging of the abdomen and pelvis was performed following the standard protocol without intravenous contrast.  COMPARISON:  DG UGI W/KUB dated 04/22/2013; CT CTA ABD/PEL W/CM AND/OR W/O CM dated 11/06/2012  FINDINGS: Lung bases show subpleural reticulation. Heart is at the upper limits of normal in size. No pericardial or pleural effusion.  Liver is unremarkable. Cholecystectomy. Adrenal glands, kidneys,  spleen, pancreas, stomach and small bowel are unremarkable. Apparent wall thickening involving the transverse colon may be due to underdistention. Colon is otherwise unremarkable. Air in the bladder is presumably iatrogenic. Uterus and ovaries are visualized. Trace pelvic free fluid. Atherosclerotic calcification of the arterial vasculature without abdominal aortic aneurysm. No pathologically enlarged lymph nodes. No worrisome lytic or sclerotic lesions. Degenerative changes are seen in the spine. Grade 1 anterolisthesis of L4 on L5, as before.  IMPRESSION: No acute findings to explain the patient's given symptoms. The appearance of slight wall thickening involving the transverse colon is likely due to underdistention.   Electronically Signed   By: Leanna BattlesMelinda  Blietz M.D.   On: 05/15/2013 14:37   Dg Chest 2 View  05/15/2013   CLINICAL DATA:  Diarrhea with vomiting and weakness.  EXAM: CHEST  2 VIEW  COMPARISON:  12/28/2012.  FINDINGS: Two views study shows no focal airspace consolidation. No pulmonary edema. Interstitial markings are diffusely coarsened with chronic features. The cardio pericardial silhouette is enlarged. Patient is status post aortic valve replacement. Telemetry leads overlie the chest.  IMPRESSION: No active cardiopulmonary disease.   Electronically Signed   By: Kennith CenterEric  Mansell M.D.   On: 05/15/2013 12:09   Medications: I have reviewed the patient's current medications. Scheduled Meds: . aspirin EC  81 mg Oral q morning - 10a  . atorvastatin  10 mg Oral Daily  . clopidogrel  75 mg Oral Q breakfast  . heparin  5,000 Units Subcutaneous Q8H  . pantoprazole  40 mg Oral Daily  . sertraline  100 mg Oral q morning - 10a   Continuous Infusions: . sodium chloride 75 mL/hr (05/15/13 2356)   PRN Meds:. Assessment/Plan: Active Problems:   Acute renal failure   LOS: 1 day   #acute on chronic kidney disease. Patient with baseline creatinine 1.0, 1.55 on admission. Improved today to 1.35 Likely  prerenal due to dehydration from decreased PO intake of fluids. Hyaline casts on urine microscopy further supports prerenal and argue against intrinsic renal causes. Unlikely to be postrenal, as patient is urinating twice daily.  -NS 4375mL/hr  -repeat BMP -hold nephrotoxic agents including valsartan  -renally dose medications  #UTI. U/A with +nitrite, large LE, and many bacteria. Patient currently having some urinary urgency.  -continue ceftriaxone  -f/u urine culture   #Chronic n/v/d. N/V/D first began after aortic valve repair. Currently followed by GI. CT abdomen with no acute findings to explain symptoms. Could be a result of new medications began after surgery, however need further records to determine when medications were started. Possible psychosocial component, as patient seems to be depressed currently. Unlikely viral illness due to chronic nature of the problem as well as normal WBC count. Unlikely mechanical obstruction with normal  CT scan.  -zofran q1 PRN  -omeprazole 20mg  BID  -clear liquid diet  -obtain medical records  #hypokalemia - K+ 3.0 on admission, now 4.2.  -resolved with repletion  #chronic diastolic CHF. No evidence of pulmonary edema on exam or CXR. Patient appears hypovolemic.  - holding home meds due to volume depletion and hypotension.   #HTN - BP 116/52 lying and 93/42 standing, consistent with orthostasis due to volume depletion.  - holding home amlodipine, atenolol, furosemide, hydralazine due to volume depletion and orthostatic hypotension on admission  #CAD. Troponins <0.3 on admission. EKG shows sinus rhythm and RBBB, unchanged from prior. No ST abnormalities.  -continue home Lipitor 10mg  qd   #Depression - mood seems depressed, however unsure of baseline. Patient states she is ready to die.  -continue home zoloft  This is a Psychologist, occupational Note.  The care of the patient was discussed with Dr. Delane Ginger and the assessment and plan formulated with their  assistance.  Please see their attached note for official documentation of the daily encounter.  Kerby Nora M 05/16/2013, 10:07 AM

## 2013-05-16 NOTE — Progress Notes (Signed)
I repeated the critical or key portions of the exam.  I confirmed/revised the medical student's history, exam, assessment and plan.   

## 2013-05-16 NOTE — Progress Notes (Signed)
Notified on-call Internal Medicine Teaching service via text page of patient having 16 beats of wide QRS but no V-Tach. Patient asymptomatic and was sleeping soundly. Copy of strip in chart. Will continue to monitor patient to end of shift.

## 2013-05-16 NOTE — Progress Notes (Signed)
INITIAL NUTRITION ASSESSMENT  DOCUMENTATION CODES Per approved criteria  -Severe malnutrition in the context of acute illness or injury   INTERVENTION: 1.  Resource Breeze daily, each supplement provides 250 kcals and 9 grams protein  2.  Recommend continued monitoring of potassium, magnesium, and phosphorus for 2 days as patient is at risk for refeeding syndrome, given very poor intake and N/V for the past month.  NUTRITION DIAGNOSIS: Malnutrition related to acute illness as evidenced by PO intake and weight loss.   Goal: Patient to meet >/= 90% of estimated nutrition needs  Monitor:  Weight trends, lab trends, I/Os, PO diet advancement and supplement acceptance  Reason for Assessment: Malnutrition Screening Tool Risk  80 y.o. female  Admitting Dx: nausea and vomiting  ASSESSMENT: PMH of CAD; CVA; HTN; CKD3; S/P TAVR (transcatheter aortic valve replacement) (11/27/2012); hyperlipidemia; carotid artery disease; and chronic diastolic heart failure. [facility] ?intractable N/V/D and epigastric pain since November which has been worked up by GI extensively. She had an EGD recently that showed no abnormalities.   Patient reported having severe nausea and vomiting since December. Patient stated that she has only been able to take in bites and sips of food "here and there" over the past month. Patient stated that prior to her current illness she was wearing a size XL and now she is in a medium. Patient was unsure of her usual weight, however flow sheet records indicate at 16% weight loss since early December.  Upon dietetic intern visit, patient consumed 0% of breakfast on 2/5. Patient was agreeable to try [employer] Breeze.  Dietetic intern encouraged patient to take sips of Resource Breeze throughout the day and continue to try to consume her clear liquid diet as tolerated.    Nutrition Focused Physical Exam:  Subcutaneous Fat:  Orbital Region:  WNL Upper Arm Region: mild depletion Thoracic and Lumbar Region: mild depeltion  Muscle:  Temple Region: moderate depeltion Clavicle Bone Region: moderate depletion Clavicle and Acromion Bone Region: severe depletion Scapular Bone Region: N/A Dorsal Hand: severe depletion Patellar Region: WNL Anterior Thigh Region: WNL Posterior Calf Region: WNL  Edema: absent  Patient meets criteria for severe malnutrition in the context of acute illness as evidenced by >5% weight loss in 1 month and </= 50% energy intakefor >/=5 days.  Patient is at risk for refeeding syndrome.  Potassium, magnesium, and phosphorous are WNL 2/4. Recommend continued monitoring for 2 days.   Height: Ht Readings from Last 1 Encounters:  05/15/13 5' (1.524 m)    Weight: Wt Readings from Last 1 Encounters:  05/16/13 112 lb 12.8 oz (51.166 kg)    Ideal Body Weight: 100 lb  % Ideal Body Weight: 112%  Wt Readings from Last 10 Encounters:  05/16/13 112 lb 12.8 oz (51.166 kg)  04/02/13 122 lb (55.339 kg)  03/13/13 133 lb (60.328 kg)  12/13/12 124 lb 12.8 oz (56.609 kg)  12/04/12 128 lb (58.06 kg)  12/03/12 128 lb 11.2 oz (58.378 kg)  12/03/12 128 lb 11.2 oz (58.378 kg)  11/23/12 130 lb (58.968 kg)  11/23/12 130 lb 9.6 oz (59.24 kg)  11/21/12 137 lb (62.143 kg)    Usual Body Weight: 130 lb  % Usual Body Weight: 86%  BMI:  Body mass index is 22.03 kg/(m^2).  Estimated Nutritional Needs: Kcal: 1400-1600 Protein: 40-50 grams Fluid: 1.5 L  Skin: no wounds  Diet Order: Clear Liquid  EDUCATION NEEDS: -No education needs identified at this time  Intake/Output Summary (Last 24 hours) at 05/16/13 1029 Last data filed at 05/15/13 1840  Gross per 24 hour  Intake    350 ml  Output      0 ml  Net    350 ml    Last BM: 2/5   Labs:   Recent Labs Lab 05/15/13 1140 05/15/13 1854 05/16/13 0343  NA 141  --  138  K 3.0*  --  4.2  CL 98  --  101  CO2 26  --  21  BUN 28*  --  26*   CREATININE 1.55*  --  1.35*  CALCIUM 8.8  --  7.8*  MG  --  1.6  --   PHOS  --  3.1  --   GLUCOSE 124*  --  68*    CBG (last 3)   Recent Labs  05/16/13 0621 05/16/13 0648  GLUCAP 62* 72    Scheduled Meds: . aspirin EC  81 mg Oral q morning - 10a  . atorvastatin  10 mg Oral Daily  . clopidogrel  75 mg Oral Q breakfast  . heparin  5,000 Units Subcutaneous Q8H  . pantoprazole  40 mg Oral Daily  . sertraline  100 mg Oral q morning - 10a    Continuous Infusions: . sodium chloride 75 mL/hr (05/15/13 2356)    Past Medical History  Diagnosis Date  . Aortic stenosis   . CAD (coronary artery disease) 10/12/2012    Cath 2011  Calcified LM,  80% mid LAD, dominant circ without sig disease, Ostial nondominant RCA.  Severe AS  Liberte stent x 2 2011 Dr. Amil Amen    . Gout     "not for years" (05/15/2013)  . Hypertensive heart disease 10/12/2012  . Hypertension   . Chronic kidney disease   . S/P TAVR (transcatheter aortic valve replacement) 11/27/2012    Stephannie Peters XT transcatheter heart valve (size 23mm) placed via open left transfemoral approach  . Hyperlipidemia 10/12/2012    intolerant to statins  . Carotid artery occlusion     moderate bilaterally  . Chronic diastolic CHF (congestive heart failure)   . Macular degeneration, left eye   . Myocardial infarction ~ 2013    "once"  . Pneumonia     "several times" (05/15/2013)  . Chronic bronchitis     "get it ~ q yr" (05/15/2013)  . Exertional shortness of breath   . History of blood transfusion     "related to ruptured appendix" (05/15/2013)  . GERD (gastroesophageal reflux disease)   . CVA (cerebral vascular accident) 2003    while living in [place].   Right brain with left hemiparesis     Past Surgical History  Procedure Laterality Date  . Cholecystectomy    . Appendectomy    . Cataract extraction w/ intraocular lens  implant, bilateral Bilateral   . Transcatheter aortic valve replacement, transfemoral N/A 11/27/2012     Procedure: TRANSCATHETER AORTIC VALVE REPLACEMENT, TRANSFEMORAL;  Surgeon: Tonny Bollman, MD;  Location: [facility] OR;  Service: Cardiovascular;  Laterality: N/A;  . Intraoperative transesophageal echocardiogram N/A 11/27/2012    Procedure: INTRAOPERATIVE TRANSESOPHAGEAL ECHOCARDIOGRAM;  Surgeon: Tonny Bollman, MD;  Location: [facility] OR;  Service: Cardiovascular;  Laterality: N/A;  . Cardiac valve replacement    . Tonsillectomy    . Cardiac catheterization    . Coronary angioplasty with stent placement      "I've had 2" (05/15/2013)    Marlane Mingle, Dietetic Intern Pager: [phone]

## 2013-05-16 NOTE — Discharge Summary (Signed)
Name: Kaylee Schmitt MRN: [id number] DOB: 06/20/1923 80 y.o. PCP: Quintella Reichertraci R Turner, MD . Date of Admission: 05/15/2013 11:09 AM Date of Discharge: 05/20/2013 Attending Physician: Randall Hissornelius N Van Dam, MD  Discharge Diagnosis: Principal Problem:   Acute renal failure Active Problems:   CAD (coronary artery disease)   History of CVA (cerebrovascular accident)   HTN (hypertension), benign   Severe aortic stenosis   GERD (gastroesophageal reflux disease)   Physical deconditioning   Nausea & vomiting   Chronic diastolic CHF (congestive heart failure)   Protein-calorie malnutrition, severe   Metabolic acidosis   UTI (urinary tract infection)  Discharge Medications:   Medication List    STOP taking these medications       amLODipine 10 MG tablet  Commonly known as:  NORVASC     furosemide 40 MG tablet  Commonly known as:  LASIX     hydrALAZINE 50 MG tablet  Commonly known as:  APRESOLINE     isosorbide mononitrate 60 MG 24 hr tablet  Commonly known as:  IMDUR     valsartan 320 MG tablet  Commonly known as:  DIOVAN      TAKE these medications       aspirin EC 81 MG tablet  Take 81 mg by mouth every morning.     atenolol 12.5 mg Tabs tablet  Commonly known as:  TENORMIN  Take 1 tablet (25 mg total) by mouth daily.     atorvastatin 10 MG tablet  Commonly known as:  LIPITOR  Take 10 mg by mouth daily.     clopidogrel 75 MG tablet  Commonly known as:  PLAVIX  Take 1 tablet (75 mg total) by mouth daily with breakfast.     diphenoxylate-atropine 2.5-0.025 MG per tablet  Commonly known as:  LOMOTIL  Take 1 tablet by mouth 4 (four) times daily as needed for diarrhea or loose stools.     irbesartan 75 MG tablet  Commonly known as:  AVAPRO  Take 1 tablet (75 mg total) by mouth daily.     omeprazole 20 MG capsule  Commonly known as:  PRILOSEC  Take 20 mg by mouth 2 (two) times daily before a meal.     PHENADOZ 25 MG suppository  Generic drug:  promethazine  Place  25 mg rectally every 6 (six) hours as needed.     potassium chloride SA 20 MEQ tablet  Commonly known as:  K-DUR,KLOR-CON  Take 40 mEq by mouth daily.     sertraline 100 MG tablet  Commonly known as:  ZOLOFT  Take 100 mg by mouth every morning.     sucralfate 1 GM/10ML suspension  Commonly known as:  CARAFATE  Take 1 g by mouth 4 (four) times daily -  with meals and at bedtime.        Disposition and follow-up:   [facility]  Please address the following problems:  1.  Diarrhea with metabolic acidosis, hypokalemia, hyponatremia; we stopped the furosemide due to concerns of volume depletion and decreased po intake; may consider restarting if needed.  Patient has a cardiologist Dr. Mayford Knifeurner.   2.  Labs / imaging needed at time of follow-up: BMP  3.  Pending labs/ test needing follow-up: Stool pathogen panel, fecal lactoferrin  Follow-up Appointments:   Discharge Instructions: Discharge Orders   Future Appointments Provider Department Dept Phone   06/17/2013 1:45 PM Quintella Reichertraci R Turner, MD [facility] [address] [phone]   07/01/2013 11:00 AM Quintella Reichert, MD [facility] [phone]   Future Orders Complete By Expires   Diet - low sodium heart healthy  As directed    Increase activity slowly  As directed       Consultations:  None  Procedures Performed:  Ct Abdomen Pelvis Wo Contrast  05/15/2013   CLINICAL DATA:  Nausea, vomiting and diarrhea.  EXAM: CT ABDOMEN AND PELVIS WITHOUT CONTRAST  TECHNIQUE: Multidetector CT imaging of the abdomen and pelvis was performed following the standard protocol without intravenous contrast.  COMPARISON:  DG UGI W/KUB dated 04/22/2013; CT CTA ABD/PEL W/CM AND/OR W/O CM dated 11/06/2012  FINDINGS: Lung bases show subpleural reticulation. Heart is at the upper limits of normal in size. No pericardial or pleural effusion.  Liver is unremarkable.  Cholecystectomy. Adrenal glands, kidneys, spleen, pancreas, stomach and small bowel are unremarkable. Apparent wall thickening involving the transverse colon may be due to underdistention. Colon is otherwise unremarkable. Air in the bladder is presumably iatrogenic. Uterus and ovaries are visualized. Trace pelvic free fluid. Atherosclerotic calcification of the arterial vasculature without abdominal aortic aneurysm. No pathologically enlarged lymph nodes. No worrisome lytic or sclerotic lesions. Degenerative changes are seen in the spine. Grade 1 anterolisthesis of L4 on L5, as before.  IMPRESSION: No acute findings to explain the patient's given symptoms. The appearance of slight wall thickening involving the transverse colon is likely due to underdistention.   Electronically Signed   By: Leanna Battles M.D.   On: 05/15/2013 14:37   Dg Chest 2 View  05/15/2013   CLINICAL DATA:  Diarrhea with vomiting and weakness.  EXAM: CHEST  2 VIEW  COMPARISON:  12/28/2012.  FINDINGS: Two views study shows no focal airspace consolidation. No pulmonary edema. Interstitial markings are diffusely coarsened with chronic features. The cardio pericardial silhouette is enlarged. Patient is status post aortic valve replacement. Telemetry leads overlie the chest.  IMPRESSION: No active cardiopulmonary disease.   Electronically Signed   By: Kennith Center M.D.   On: 05/15/2013 12:09   Dg Ugi W/kub  04/22/2013   CLINICAL DATA:  Nausea and vomiting, weight loss.  EXAM: UPPER GI SERIES W/ KUB  TECHNIQUE: After obtaining a scout radiograph a routine upper GI series was performed using thin barium.  COMPARISON:  None.  FLUOROSCOPY TIME:  1 min 42 seconds.  FINDINGS: Scout view of the abdomen shows a normal bowel gas pattern. Surgical clips in the right upper quadrant. Vascular calcifications. Degenerative changes in the spine.  A limited examination was performed due to patient condition. Esophageal motility is normal. No esophageal fold  thickening. Although a discrete stricture is not identified, a 13 mm barium pill would not pass beyond the distal esophagus, into the stomach. Stomach is suboptimally distended as patient was unable to consume an adequate amount of barium. Stomach and duodenum bulb are grossly unremarkable.  IMPRESSION: 1. Limited examination due to patient condition, as discussed above. 2. Although a discrete stricture is not identified, a 13 mm barium pill would not pass beyond the gastroesophageal junction.   Electronically Signed   By: Leanna Battles M.D.   On: 04/22/2013 09:49    2D Echo: N/A  Cardiac Cath: N/A  Admission HPI: Ms. Reitz is a 80 yr old woman with a history of aortic stenosis (s/p TAVR in 11/2012), diastolic CHF, CAD, CVD, and CKD who was brought to the ED by her daughter after she was found partially unresponsive on the  floor of her assisted living facility. The patient reports intractable nausea, vomiting, and diarrhea since her TAVR in August of 2014 that has limited her ability to eat to drink. She endorses constant nausea that is worse after eating or drinking, with no relieving factors. She can only take sips of liquids and has not eaten solid food since before Christmas. She has lost 30lbs since November. Her vomit in the morning typically consists of any food she ate the night before in addition to green bile and black liquid. She sees a GI doctor and underwent an endoscopy last week which was unremarkable. She is unsure if she has had a colonoscopy. She endorses chronic abdominal pain across her entire abdomen but more severe on the right. She states she has had decreased urine output and only urinates twice a day. She denies any dysuria or hematuria. She occasionally has swelling in her ankles and endorses chronic SOB that is at baseline. She has chronic pain in her upper back that is unchanged. She also reports macular degeneration in her L eye and significant hearing loss in both ears  requiring a hearing aid. She states she has chest pain, but points to her abdomen when asked to localize it. She has been staying in bed most of the day over the past couple weeks due to nausea and weakness. She has not been taking her prescribed potassium pill because she does not like swallowing it.  She is able to bathe and dress herself, however she often has assistance from her assisted living facility. Her sons report multiple falls recently and they believe she should not be bathing without assistance. Her daughter manages her finances. Her sons wonder if she depressed due to many recent deaths of close family members and friends. The patient states she is ready to die.    Hospital Course by problem list: Principal Problem:   Acute renal failure Active Problems:   CAD (coronary artery disease)   History of CVA (cerebrovascular accident)   HTN (hypertension), benign   Severe aortic stenosis   GERD (gastroesophageal reflux disease)   Physical deconditioning   Nausea & vomiting   Chronic diastolic CHF (congestive heart failure)   Protein-calorie malnutrition, severe   Metabolic acidosis   UTI (urinary tract infection)   Ms. Cerney is a 80 yr old woman with a history of aortic stenosis (s/p TAVR in 11/2012), diastolic CHF (grade 2), CAD, CVD, and CKD (stage 3) who was brought to the ED by her daughter after she was found on the floor of her assisted living facility. She reported intractable nausea and vomiting since her TAVR in August of 2014 that limited her ability to eat to drink. She also reported a several day history of diarrhea and diffuse abdominal pian. At home, she had only been taking sips of liquids and had not eaten solid food for months. She had an endoscopy the week prior to admission which revealed a benign appearing esophageal stricture that was subsequently dilated. Her BP on admission was 116/52 lying and 93/42 standing, consistent with orthostasis due to volume depletion.  In the ED, judicious IVF was given with improvement in SBP.  #AKI: On admission, Ms. Mcnab's creatinine was found to be elevated to 1.55 over her baseline of 1.0. After several days of IV fluids, her creatinine down trended and stabilized at 1.0  #UTI: U/A in the ED showed evidence of UTI with +nitrite, large LE, and many bacteria. Culture grew out pansensitive E Coli. She was  asymptomatic apart from some urinary urgency/incontinence. She was started on IV ceftriaxone in the ED and transitioned to oral keflex for a total course of 5 days.  #Chronic nausea/vomiting: Ms. Hammett complained of nausea after eating and had limited food intake while in the hospital. She had 1 episode of vomiting. CT abdomen was negative for any acute processes. She was continued on a PPI BID and given zofran PRN for nausea. She was placed on a mechanical soft diet per recommendations from her outpatient GI doctor. She was seen by nutrition, who recommended addition of Resource Breeze shakes to her daily intake secondary to malnutrition with a serum albumin of 2.6.  #hypokalemia: Patient was found to be hypokalemic on admission with K+ of 3.0. She had not been taking her prescribed K+ tablet because she did not like swallowing the pill. Her K+ was repleted as needed. Mg and Phosphorus levels were normal.  #diarrhea: Ms. Pizzini had nonbloody diarrhea upon admission, which persisted for the duration of her hospital stay but had decreased to 2-3 times per day at the time of discharge. She tested negative for Cdiff and stool pathogen panel was pending at the time of discharge. She did develop a metabolic acidosis, likely secondary to diarrhea and dilution. Her mIVFs were switched from NS to D5 1/2W and her bicarb was improved to 19 at discharge. She was discharged on lomotil.  #chronic diastolic CHF: Patient's lasix was held due to her hypovolemia. She did not have any findings of volume overload on exam throughout the course of her  hospitalization.  #HTN: Patient's home medications were held at admission due to volume depletion and orthostatic hypotension. After receiving 2 days of IVF, she became mildly hypertensive and was restarted on her home atenolol 25mg . Her dose was subsequently cut in half to 12.5mg  due to bradycardia with pulses in the 40's and 50's. She was also restarted on irbesartan once her creatinine normalized.  #CAD Troponins were found to be <0.3 on admission. EKG showed sinus rhythm and RBBB, unchanged from prior with no ST abnormalities. Her home lipitor was discontinued due to limited evidence of benefit given patient's age.  #Depression: Patient's family expressed concern over worsening depression due to recent loss of multiple family members and friends. She did not express any suicidal ideation while in the hospital. She was continued on her home zoloft.    Discharge Vitals:   BP 135/49  Pulse 60  Temp(Src) 97.6 F (36.4 C) (Oral)  Resp 18  Ht 5' (1.524 m)  Wt 122 lb 6.4 oz (55.52 kg)  BMI 23.90 kg/m2  SpO2 100%  Discharge Labs:  Results for orders placed during the hospital encounter of 05/15/13 (from the past 24 hour(s))  BASIC METABOLIC PANEL     Status: Abnormal   Collection Time    05/19/13  3:48 PM      Result Value Range   Sodium 131 (*) 137 - 147 mEq/L   Potassium 3.6 (*) 3.7 - 5.3 mEq/L   Chloride 102  96 - 112 mEq/L   CO2 15 (*) 19 - 32 mEq/L   Glucose, Bld 86  70 - 99 mg/dL   BUN 10  6 - 23 mg/dL   Creatinine, Ser 6.96  0.50 - 1.10 mg/dL   Calcium 7.5 (*) 8.4 - 10.5 mg/dL   GFR calc non Af Amer 53 (*) >90 mL/min   GFR calc Af Amer 61 (*) >90 mL/min  BASIC METABOLIC PANEL     Status: Abnormal  Collection Time    05/20/13  5:47 AM      Result Value Range   Sodium 135 (*) 137 - 147 mEq/L   Potassium 4.1  3.7 - 5.3 mEq/L   Chloride 105  96 - 112 mEq/L   CO2 19  19 - 32 mEq/L   Glucose, Bld 80  70 - 99 mg/dL   BUN 8  6 - 23 mg/dL   Creatinine, Ser 1.61  0.50 - 1.10  mg/dL   Calcium 7.4 (*) 8.4 - 10.5 mg/dL   GFR calc non Af Amer 52 (*) >90 mL/min   GFR calc Af Amer 60 (*) >90 mL/min  GLUCOSE, CAPILLARY     Status: None   Collection Time    05/20/13  5:59 AM      Result Value Range   Glucose-Capillary 81  70 - 99 mg/dL    Signed: Boykin Peek, MD [phone] 05/20/2013, 12:54 PM   Time Spent on Discharge: Services Ordered on Discharge: None Equipment Ordered on Discharge: None

## 2013-05-16 NOTE — Progress Notes (Signed)
Hypoglycemic Event  CBG: 62  Treatment: 15 GM carbohydrate snack and 15 GM gel  Symptoms: None  Follow-up CBG: Time:6:49 AM CBG Result:72  Possible Reasons for Event: Inadequate meal intake  Comments/MD notified:Notified Teaching Service via text page    Yamili Lichtenwalner  Remember to initiate Hypoglycemia Order Set & complete

## 2013-05-16 NOTE — Progress Notes (Signed)
Utilization Review Completed.  

## 2013-05-16 NOTE — Progress Notes (Signed)
Agree with dietetic intern note. Adlynn Lowenstein Barnett RD, LDN Inpatient Clinical Dietitian Pager: [phone] After Hours Pager: [phone]  

## 2013-05-17 LAB — MAGNESIUM: Magnesium: 1.6 mg/dL (ref 1.5–2.5)

## 2013-05-17 LAB — HIV ANTIBODY (ROUTINE TESTING W REFLEX): HIV: NONREACTIVE

## 2013-05-17 LAB — COMPREHENSIVE METABOLIC PANEL
ALT: 20 U/L (ref 0–35)
AST: 35 U/L (ref 0–37)
Albumin: 2.6 g/dL — ABNORMAL LOW (ref 3.5–5.2)
Alkaline Phosphatase: 67 U/L (ref 39–117)
BILIRUBIN TOTAL: 0.7 mg/dL (ref 0.3–1.2)
BUN: 20 mg/dL (ref 6–23)
CO2: 17 meq/L — AB (ref 19–32)
Calcium: 8.1 mg/dL — ABNORMAL LOW (ref 8.4–10.5)
Chloride: 103 mEq/L (ref 96–112)
Creatinine, Ser: 1.14 mg/dL — ABNORMAL HIGH (ref 0.50–1.10)
GFR calc Af Amer: 49 mL/min — ABNORMAL LOW (ref >90)
GFR calc non Af Amer: 42 mL/min — ABNORMAL LOW (ref >90)
Glucose, Bld: 77 mg/dL (ref 70–99)
Potassium: 3.9 mEq/L (ref 3.7–5.3)
SODIUM: 137 meq/L (ref 137–147)
Total Protein: 4.8 g/dL — ABNORMAL LOW (ref 6.0–8.3)

## 2013-05-17 LAB — CLOSTRIDIUM DIFFICILE BY PCR: Toxigenic C. Difficile by PCR: NEGATIVE

## 2013-05-17 LAB — PHOSPHORUS: Phosphorus: 2.3 mg/dL (ref 2.3–4.6)

## 2013-05-17 LAB — HEPATITIS PANEL, ACUTE
HCV Ab: NEGATIVE
Hep A IgM: NONREACTIVE
Hep B C IgM: NONREACTIVE
Hepatitis B Surface Ag: NEGATIVE

## 2013-05-17 LAB — GLUCOSE, CAPILLARY: Glucose-Capillary: 79 mg/dL (ref 70–99)

## 2013-05-17 MED ORDER — CEPHALEXIN 500 MG PO CAPS
500.0000 mg | ORAL_CAPSULE | Freq: Three times a day (TID) | ORAL | Status: DC
  Administered 2013-05-17 – 2013-05-18 (×4): 500 mg via ORAL
  Filled 2013-05-17 (×9): qty 1

## 2013-05-17 MED ORDER — ATENOLOL 25 MG PO TABS
25.0000 mg | ORAL_TABLET | Freq: Every day | ORAL | Status: DC
  Administered 2013-05-17 – 2013-05-18 (×2): 25 mg via ORAL
  Filled 2013-05-17 (×3): qty 1

## 2013-05-17 NOTE — Progress Notes (Signed)
  Date: 05/17/2013  Patient name: Kaylee Schmitt  Medical record number: [id number]  Date of birth: 01/22/1924   This patient has been seen and the plan of care was discussed with the house staff. Please see their note for complete details. I concur with their findings with the following additions/corrections:  Patient looks dramatically better today. We found out that she had been found to have an esophageal stricture on EGD and is sp dilatation. She is NO longer vomiting up food or liquids and better able to eat though not yet in large quantities. She did not have symptoms with standing this am of dizziness and BP while dropping 20 pts dropped to 150s systolic  We will stop  Her IVF, restart beta blocker  WE have changed from rocephin to keflex to rx UTI and would give her NO MORE than 5 days total antibiotics  She will need extensive PT/OT and nutrition and rehab and willwork on placing in SNF    HIV and Hep panel Negative  Randall Hissornelius N Van Dam, MD 05/17/2013, 1:46 PM

## 2013-05-17 NOTE — Progress Notes (Signed)
Subjective:   Pt feels better this AM and is visited by her granddaughter.  She is sitting up in the chair and had some of her breakfast and was nauseated.  She states "it was so much".  She c/o some urinary frequency without dysuria.    Objective:   Vital signs in last 24 hours: Filed Vitals:   05/17/13 0614 05/17/13 0619 05/17/13 1016 05/17/13 1221  BP: 153/57 170/64 148/69 185/49  Pulse:   73 67  Temp:      TempSrc:      Resp:   18   Height:      Weight:      SpO2:   100% 98%   Weight change: 6 lb 10.1 oz (3.009 kg)  Intake/Output Summary (Last 24 hours) at 05/17/13 1437 Last data filed at 05/17/13 1300  Gross per 24 hour  Intake    360 ml  Output      3 ml  Net    357 ml    Physical Exam: Constitutional: Vital signs reviewed.  Patient is in no acute distress and cooperative with exam.   Head: Normocephalic and atraumatic Eyes: EOMI, pale conjunctivae, no scleral icterus  Neck: Supple, Trachea midline Cardiovascular: RRR, S1, S2 present, no MRG, DP 2+ b/l Pulmonary/Chest: normal respiratory effort, CTAB, no wheezes, rales, or rhonchi Abdominal: Soft. +BS, +epigastric pain, non-distended Neurological: A&O x3, cranial nerve II-XII are grossly intact, moving all extremities  Skin: Warm, dry and intact. No rash Psychiatric: Normal mood and affect.   Lab Results:  BMP:  Recent Labs Lab 05/15/13 1854 05/16/13 0343 05/17/13 0325  NA  --  138 137  K  --  4.2 3.9  CL  --  101 103  CO2  --  21 17*  GLUCOSE  --  68* 77  BUN  --  26* 20  CREATININE  --  1.35* 1.14*  CALCIUM  --  7.8* 8.1*  MG 1.6  --  1.6  PHOS 3.1  --  2.3    CBC:  Recent Labs Lab 05/15/13 1140  WBC 6.1  NEUTROABS 4.7  HGB 13.2  HCT 38.1  MCV 87.4  PLT 155    Coagulation: No results found for this basename: LABPROT, INR,  in the last 168 hours  CBG:            Recent Labs Lab 05/16/13 0621 05/16/13 0648 05/17/13 0618  GLUCAP 62* 72 79           HA1C:      No  results found for this basename: HGBA1C,  in the last 168 hours  Lipid Panel: No results found for this basename: CHOL, HDL, LDLCALC, TRIG, CHOLHDL, LDLDIRECT,  in the last 168 hours  LFTs:  Recent Labs Lab 05/15/13 1140 05/17/13 0325  AST 33 35  ALT 22 20  ALKPHOS 68 67  BILITOT 1.1 0.7  PROT 5.2* 4.8*  ALBUMIN 3.0* 2.6*    Pancreatic Enzymes:  Recent Labs Lab 05/15/13 1140  LIPASE 29    Ammonia: No results found for this basename: AMMONIA,  in the last 168 hours  Cardiac Enzymes:  Recent Labs Lab 05/15/13 1140  TROPONINI <0.30   Lab Results  Component Value Date   CKTOTAL 52 01/25/2010   CKMB 0.6 01/25/2010   TROPONINI <0.30 05/15/2013    BNP: No results found for this basename: PROBNP,  in the last 168 hours  D-Dimer: No results found for this basename: DDIMER,  in the last 168 hours  Urinalysis:  Recent Labs Lab 05/15/13 1442  COLORURINE YELLOW  LABSPEC 1.020  PHURINE 5.5  GLUCOSEU NEGATIVE  HGBUR NEGATIVE  BILIRUBINUR MODERATE*  KETONESUR 15*  PROTEINUR NEGATIVE  UROBILINOGEN 1.0  NITRITE POSITIVE*  LEUKOCYTESUR LARGE*    Micro Results: Recent Results (from the past 240 hour(s))  URINE CULTURE     Status: None   Collection Time    05/15/13  2:42 PM      Result Value Range Status   Specimen Description URINE, CATHETERIZED   Final   Special Requests NONE   Final   Culture  Setup Time     Final   Value: 05/15/2013 22:25     Performed at [facility] Count     Final   Value: >=100,000 COLONIES/ML     Performed at [facility]   Culture     Final   Value: ESCHERICHIA COLI     Performed at [facility]   Report Status 05/16/2013 FINAL   Final   Organism ID, Bacteria ESCHERICHIA COLI   Final  MRSA PCR SCREENING     Status: None   Collection Time    05/16/13 12:38 AM      Result Value Range Status   MRSA by PCR NEGATIVE  NEGATIVE Final   Comment:            The GeneXpert MRSA Assay (FDA      approved for NASAL specimens     only), is one component of a     comprehensive MRSA colonization     surveillance program. It is not     intended to diagnose MRSA     infection nor to guide or     monitor treatment for     MRSA infections.  CLOSTRIDIUM DIFFICILE BY PCR     Status: None   Collection Time    05/17/13  3:28 AM      Result Value Range Status   C difficile by pcr NEGATIVE  NEGATIVE Final    Blood Culture:    Component Value Date/Time   SDES URINE, CATHETERIZED 05/15/2013 1442   SPECREQUEST NONE 05/15/2013 1442   CULT  Value: ESCHERICHIA COLI Performed at [facility] 05/15/2013 1442   REPTSTATUS 05/16/2013 FINAL 05/15/2013 1442    Studies/Results: No results found.  Medications:  Scheduled Meds: . aspirin EC  81 mg Oral q morning - 10a  . atenolol  25 mg Oral Daily  . atorvastatin  10 mg Oral Daily  . cephALEXin  500 mg Oral Q8H  . clopidogrel  75 mg Oral Q breakfast  . feeding supplement (RESOURCE BREEZE)  1 Container Oral Q24H  . heparin  5,000 Units Subcutaneous Q8H  . pantoprazole  40 mg Oral Daily  . sertraline  100 mg Oral q morning - 10a  . sucralfate  1 g Oral BID AC   Continuous Infusions: . sodium chloride 75 mL/hr at 05/17/13 1356   PRN Meds:.  Antibiotics: Anti-infectives   Start     Dose/Rate Route Frequency Ordered Stop   05/17/13 1400  cephALEXin (KEFLEX) capsule 500 mg     500 mg Oral 3 times per day 05/17/13 1010     05/15/13 1530  cefTRIAXone (ROCEPHIN) 1 g in dextrose 5 % 50 mL IVPB     1 g 100 mL/hr over 30 Minutes Intravenous  Once 05/15/13 1518 05/15/13 1856     Antibiotics Given (  last 72 hours)   None      Day of Hospitalization:  2 Consults:    Assessment/Plan:   Active Problems:   Acute renal failure   Protein-calorie malnutrition, severe  #Chronic nausea/vomiting/diarrhea - Pt reports feeling better this AM but is still nauseated.  Pt had esophageal stricture dilation on 05/09/13 and EGD revealed benign  appearing intrinsic stenosis with normal stomach and duodenum.  GI recommended mechanical soft diet and return to office PRN.  She may have to get this time to improve given her recent dilation. HIV negative. Acute hepatitis panel negative. C.diff negative.  -mechanical soft diet  -d/c IVF  -continue PPI (protonix); pt on prilosec bid at home  -strict documentation of any episodes of vomiting/diarrhea  -appreciate nutrition consult recs -FT4 wnl  #Acute on CKD3 - Creatinine is trending down from 1.55 on admission to 1.14 today with IVF.  Mg/phos wnl.  -d/c IVF -strict I/Os  -avoid nephrotoxic meds  -BMP in AM   Lab Results  Component Value Date   CREATININE 1.14* 05/17/2013    #UTI - Pt with urinary symptoms of frequency.  UA revealed leukocytes and nitrites.  Culture>100,000 E.coli.  -keflex 500mg  q8h   #HTN - Pt orthostasis resolved and denies dizziness.  Goal BP is more liberal <150/90. --d/c IVF  #Chronic diastolic HF - Pt does not appear volume overloaded on exam  -daily weights  -strict I/Os  #Protein Calorie Malnutrition, Severe -  -appreciate nutrition recs -added nutritional shake supplements to diet   #FEN- d/c IVF  Electrolytes-Replete as needed Diet-Dysphagia 3 per GI recommendations   #Dispo- Disposition is deferred at this time, awaiting improvement of current medical problems.  Anticipated discharge in approximately 1-2 day(s).    LOS: 2 days   Boykin PeekJacquelyn Swannie Milius, MD PGY-1, Internal Medicine  [phone] (7AM-5PM Mon-Fri) 05/17/2013, 2:37 PM

## 2013-05-17 NOTE — Clinical Social Work Placement (Addendum)
     Clinical Social Work Department CLINICAL SOCIAL WORK PLACEMENT NOTE 05/22/2013  Patient:  Kaylee Schmitt,Kaylee Schmitt  Account Number:  [id number] Admit date:  05/15/2013  Clinical Social Worker:  Lupita LeashNNA Jamerion Cabello, LCSWA  Date/time:  05/16/2013 04:00 PM  Clinical Social Work is seeking post-discharge placement for this patient at the following level of care:   SKILLED NURSING   (*CSW will update this form in Epic as items are completed)   05/16/2013  [facility]/family provided with [facility] of Clinical Social Works list of facilities offering this level of care within the geographic area requested by the patient (or if unable, by the patients family).  05/16/2013  Patient/family informed of their freedom to choose among providers that offer the needed level of care, that participate in Medicare, Medicaid or managed care program needed by the patient, have an available bed and are willing to accept the patient.  05/16/2013  Patient/family informed of [facility] ownership interest in [facility], as well as of the fact that they are under no obligation to receive care at this facility.  PASARR submitted to EDS on  PASARR number received from EDS on   FL2 transmitted to all facilities in geographic area requested by pt/family on  05/17/2013 FL2 transmitted to all facilities within larger geographic area on   Patient informed that his/her managed care company has contracts with or will negotiate with  certain facilities, including the following:   NA. Has existing PASARR     Patient/family informed of bed offers received:  05[facility]20/2013 [facility] Physician recommends and patient chooses bed at    [facility]  05/20/2013 Patient to be transferred to facility by Ambulance  [facility])  The following physician request were entered in Epic:   Additional Comments: 05/20/13  [facility]- patient and family  were pleased with d/c plan as this was their choice for admission.  Notified RN to call report. No further CSW needs identified.  Lorri Frederickonna T. Shakoya Gilmore, LCSWA  [phone]

## 2013-05-17 NOTE — Progress Notes (Signed)
Active bed search is in place- patient [facility] and patient related to nursing that she was interested in returning there.  Fl2 on chart for MD's signature.  Plan d/c to SNF when medically stable per MD. Lupita Leashonna T. West PughCrowder, LCSWA  [phone]

## 2013-05-17 NOTE — Progress Notes (Signed)
Physical Therapy Treatment Patient Details Name: Kaylee Schmitt MRN: [id number] DOB: 02/03/1924 Today's Date: 05/17/2013 Time: [id number] PT Time Calculation (min): 23 min  PT Assessment / Plan / Recommendation  History of Present Illness Pt admit with acute renal failure and UTI.     PT Comments   Pt with improving mobility. If ALF can provide assist with mobility pt could return there with HHPT.  If ALF not able to provide assist with mobility will need ST-SNF.  Follow Up Recommendations  Home health PT;SNF     Does the patient have the potential to tolerate intense rehabilitation     Barriers to Discharge        Equipment Recommendations  None recommended by PT    Recommendations for Other Services    Frequency Min 3X/week   Progress towards PT Goals Progress towards PT goals: Progressing toward goals  Plan Discharge plan needs to be updated    Precautions / Restrictions Precautions Precautions: Fall   Pertinent Vitals/Pain No c/o's    Mobility  Bed Mobility Overal bed mobility: Needs Assistance Bed Mobility: Supine to Sit Supine to sit: Min assist;HOB elevated General bed mobility comments: Assist to bring trunk up Transfers Overall transfer level: Needs assistance Equipment used: Rolling walker (2 wheeled) Sit to Stand: Min assist General transfer comment: Assist to bring hips up from sitting. Ambulation/Gait Ambulation/Gait assistance: Min guard Ambulation Distance (Feet): 15 Feet (x 2) Assistive device: Rolling walker (2 wheeled) Gait Pattern/deviations: Step-through pattern;Decreased step length - right;Decreased step length - left;Trunk flexed General Gait Details: Verbal cues to keep both hands on the walker and to steer the walker.    Exercises     PT Diagnosis:    PT Problem List:   PT Treatment Interventions:     PT Goals (current goals can now be found in the care plan section)    Visit Information  Last PT Received On: 05/17/13 Assistance  Needed: +1 History of Present Illness: Pt admit with acute renal failure and UTI.      Subjective Data      Cognition  Cognition Arousal/Alertness: Awake/alert Behavior During Therapy: WFL for tasks assessed/performed Overall Cognitive Status: Within Functional Limits for tasks assessed    Balance  Balance Standing balance support: Bilateral upper extremity supported Standing balance-Leahy Scale: Poor  End of Session PT - End of Session Activity Tolerance: Patient tolerated treatment well Patient left: in chair;with call bell/phone within reach Nurse Communication: Mobility status   GP     [facility] Hospital[facility],Kaylee Schmitt 05/17/2013, 9:47 AM  [facility] [phone]

## 2013-05-17 NOTE — Progress Notes (Signed)
Patient alert and oriented x3-4 throughout shift.  Vital signs stable.  Patient had episode of emesis (yellow, with some undigested food and one pill) earlier in shift.  No PRN for nausea available.  MD notified; no additional orders placed.  Patient denies any questions or concerns at this time.  Family at bedside throughout shift.  Will continue to monitor.

## 2013-05-17 NOTE — Progress Notes (Signed)
Medical Student Daily Progress Note  Subjective: [name] reports feeling better today. She only had one episode of diarrhea yesterday evening and has not yet had a bowel movement today. She experienced some nausea after eating a few bites of breakfast but has not vomiting while here in the hospital. She has been sipping on water but reports minimal food intake.  Objective: Vital signs in last 24 hours: Filed Vitals:   05/17/13 0609 05/17/13 0612 05/17/13 0614 05/17/13 0619  BP: 177/51 163/64 153/57 170/64  Pulse:      Temp:      TempSrc:      Resp:      Height:      Weight:      SpO2:       Weight change: 3.009 kg (6 lb 10.1 oz)  Intake/Output Summary (Last 24 hours) at 05/17/13 1155 Last data filed at 05/17/13 0900  Gross per 24 hour  Intake 1847.5 ml  Output      5 ml  Net 1842.5 ml   Physical Exam: General appearance: alert, in no acute distress, pale  Eyes: conjunctivae/corneas clear. PERRL, EOM's intact  Throat: moist mucus membranes, lips, mucosa, and tongue normal  Lungs: clear to auscultation bilaterally  Heart: regular rate and rhythm, S1, S2 normal, no murmur, click, rub or gallop  Abdomen: soft, nondistended tender to palpation in all 4 quadrants, most significant in RLQ/RUQ. Normal bowel sounds, no masses  Extremities: cold, no evidence of venous stasis  Pulses: 1+ DP pulses bilaterally  Neurologic: CNs grossly intact   Lab Results: Results for orders placed during the hospital encounter of 05/15/13  URINE CULTURE      Result Value Range   Specimen Description URINE, CATHETERIZED     Special Requests NONE     Culture  Setup Time       Value: 05/15/2013 22:25     Performed at [facility]   Colony Count       Value: >=100,000 COLONIES/ML     [facility]formed at [facility]   Culture       Value: ESCHERICHIA COLI     Performed at [facility]   Report Status 05/16/2013 FINAL     Organism ID, Bacteria ESCHERICHIA COLI    MRSA PCR  SCREENING      Result Value Range   MRSA by PCR NEGATIVE  NEGATIVE  CLOSTRIDIUM DIFFICILE BY PCR      Result Value Range   C difficile by pcr NEGATIVE  NEGATIVE  URINALYSIS W MICROSCOPIC + REFLEX CULTURE      Result Value Range   Color, Urine YELLOW  YELLOW   APPearance CLOUDY (*) CLEAR   Specific Gravity, Urine 1.020  1.005 - 1.030   pH 5.5  5.0 - 8.0   Glucose, UA NEGATIVE  NEGATIVE mg/dL   Hgb urine dipstick NEGATIVE  NEGATIVE   Bilirubin Urine MODERATE (*) NEGATIVE   Ketones, ur 15 (*) NEGATIVE mg/dL   Protein, ur NEGATIVE  NEGATIVE mg/dL   Urobilinogen, UA 1.0  0.0 - 1.0 mg/dL   Nitrite POSITIVE (*) NEGATIVE   Leukocytes, UA LARGE (*) NEGATIVE   WBC, UA 21-50  <3 WBC/hpf   Bacteria, UA MANY (*) RARE   Squamous Epithelial / LPF RARE  RARE   Casts HYALINE CASTS (*) NEGATIVE  CBC WITH DIFFERENTIAL      Result Value Range   WBC 6.1  4.0 - 10.5 K/uL   RBC 4.36  3.87 -  5.11 MIL/uL   Hemoglobin 13.2  12.0 - 15.0 g/dL   HCT 16.1  09.6 - 04.5 %   MCV 87.4  78.0 - 100.0 fL   MCH 30.3  26.0 - 34.0 pg   MCHC 34.6  30.0 - 36.0 g/dL   RDW 40.9 (*) 81.1 - 91.4 %   Platelets 155  150 - 400 K/uL   Neutrophils Relative % 77  43 - 77 %   Neutro Abs 4.7  1.7 - 7.7 K/uL   Lymphocytes Relative 13  12 - 46 %   Lymphs Abs 0.8  0.7 - 4.0 K/uL   Monocytes Relative 8  3 - 12 %   Monocytes Absolute 0.5  0.1 - 1.0 K/uL   Eosinophils Relative 2  0 - 5 %   Eosinophils Absolute 0.1  0.0 - 0.7 K/uL   Basophils Relative 0  0 - 1 %   Basophils Absolute 0.0  0.0 - 0.1 K/uL  COMPREHENSIVE METABOLIC PANEL      Result Value Range   Sodium 141  137 - 147 mEq/L   Potassium 3.0 (*) 3.7 - 5.3 mEq/L   Chloride 98  96 - 112 mEq/L   CO2 26  19 - 32 mEq/L   Glucose, Bld 124 (*) 70 - 99 mg/dL   BUN 28 (*) 6 - 23 mg/dL   Creatinine, Ser 7.82 (*) 0.50 - 1.10 mg/dL   Calcium 8.8  8.4 - 95.6 mg/dL   Total Protein 5.2 (*) 6.0 - 8.3 g/dL   Albumin 3.0 (*) 3.5 - 5.2 g/dL   AST 33  0 - 37 U/L   ALT 22  0 - 35  U/L   Alkaline Phosphatase 68  39 - 117 U/L   Total Bilirubin 1.1  0.3 - 1.2 mg/dL   GFR calc non Af Amer 29 (*) >90 mL/min   GFR calc Af Amer 34 (*) >90 mL/min  LIPASE, BLOOD      Result Value Range   Lipase 29  11 - 59 U/L  TROPONIN I      Result Value Range   Troponin I <0.30  <0.30 ng/mL  BASIC METABOLIC PANEL      Result Value Range   Sodium 138  137 - 147 mEq/L   Potassium 4.2  3.7 - 5.3 mEq/L   Chloride 101  96 - 112 mEq/L   CO2 21  19 - 32 mEq/L   Glucose, Bld 68 (*) 70 - 99 mg/dL   BUN 26 (*) 6 - 23 mg/dL   Creatinine, Ser 2.13 (*) 0.50 - 1.10 mg/dL   Calcium 7.8 (*) 8.4 - 10.5 mg/dL   GFR calc non Af Amer 34 (*) >90 mL/min   GFR calc Af Amer 40 (*) >90 mL/min  TSH      Result Value Range   TSH 4.503 (*) 0.350 - 4.500 uIU/mL  MAGNESIUM      Result Value Range   Magnesium 1.6  1.5 - 2.5 mg/dL  PHOSPHORUS      Result Value Range   Phosphorus 3.1  2.3 - 4.6 mg/dL  GLUCOSE, CAPILLARY      Result Value Range   Glucose-Capillary 62 (*) 70 - 99 mg/dL   Comment 1 Notify RN    GLUCOSE, CAPILLARY      Result Value Range   Glucose-Capillary 72  70 - 99 mg/dL  T4, FREE      Result Value Range   Free T4 0.99  0.80 -  1.80 ng/dL  COMPREHENSIVE METABOLIC PANEL      Result Value Range   Sodium 137  137 - 147 mEq/L   Potassium 3.9  3.7 - 5.3 mEq/L   Chloride 103  96 - 112 mEq/L   CO2 17 (*) 19 - 32 mEq/L   Glucose, Bld 77  70 - 99 mg/dL   BUN 20  6 - 23 mg/dL   Creatinine, Ser 5.36 (*) 0.50 - 1.10 mg/dL   Calcium 8.1 (*) 8.4 - 10.5 mg/dL   Total Protein 4.8 (*) 6.0 - 8.3 g/dL   Albumin 2.6 (*) 3.5 - 5.2 g/dL   AST 35  0 - 37 U/L   ALT 20  0 - 35 U/L   Alkaline Phosphatase 67  39 - 117 U/L   Total Bilirubin 0.7  0.3 - 1.2 mg/dL   GFR calc non Af Amer 42 (*) >90 mL/min   GFR calc Af Amer 49 (*) >90 mL/min  MAGNESIUM      Result Value Range   Magnesium 1.6  1.5 - 2.5 mg/dL  PHOSPHORUS      Result Value Range   Phosphorus 2.3  2.3 - 4.6 mg/dL  HIV ANTIBODY  (ROUTINE TESTING)      Result Value Range   HIV NON REACTIVE  NON REACTIVE  HEPATITIS PANEL, ACUTE      Result Value Range   Hepatitis B Surface Ag NEGATIVE  NEGATIVE   HCV Ab NEGATIVE  NEGATIVE   Hep A IgM NON REACTIVE  NON REACTIVE   Hep B C IgM NON REACTIVE  NON REACTIVE  GLUCOSE, CAPILLARY      Result Value Range   Glucose-Capillary 79  70 - 99 mg/dL  CG4 I-STAT (LACTIC ACID)      Result Value Range   Lactic Acid, Venous 2.10  0.5 - 2.2 mmol/L   Micro Results: Recent Results (from the past 240 hour(s))  URINE CULTURE     Status: None   Collection Time    05/15/13  2:42 PM      Result Value Range Status   Specimen Description URINE, CATHETERIZED   Final   Special Requests NONE   Final   Culture  Setup Time     Final   Value: 05/15/2013 22:25     Performed at [facility] Count     Final   Value: >=100,000 COLONIES/ML     Performed at [facility]   Culture     Final   Value: ESCHERICHIA COLI     Performed at [facility]   Report Status 05/16/2013 FINAL   Final   Organism ID, Bacteria ESCHERICHIA COLI   Final  MRSA PCR SCREENING     Status: None   Collection Time    05/16/13 12:38 AM      Result Value Range Status   MRSA by PCR NEGATIVE  NEGATIVE Final   Comment:            The GeneXpert MRSA Assay (FDA     approved for NASAL specimens     only), is one component of a     comprehensive MRSA colonization     surveillance program. It is not     intended to diagnose MRSA     infection nor to guide or     monitor treatment for     MRSA infections.  CLOSTRIDIUM DIFFICILE BY PCR     Status: None   Collection  Time    05/17/13  3:28 AM      Result Value Range Status   C difficile by pcr NEGATIVE  NEGATIVE Final   Studies/Results: Ct Abdomen Pelvis Wo Contrast  05/15/2013   CLINICAL DATA:  Nausea, vomiting and diarrhea.  EXAM: CT ABDOMEN AND PELVIS WITHOUT CONTRAST  TECHNIQUE: Multidetector CT imaging of the abdomen and pelvis was  performed following the standard protocol without intravenous contrast.  COMPARISON:  DG UGI W/KUB dated 04/22/2013; CT CTA ABD/PEL W/CM AND/OR W/O CM dated 11/06/2012  FINDINGS: Lung bases show subpleural reticulation. Heart is at the upper limits of normal in size. No pericardial or pleural effusion.  Liver is unremarkable. Cholecystectomy. Adrenal glands, kidneys, spleen, pancreas, stomach and small bowel are unremarkable. Apparent wall thickening involving the transverse colon may be due to underdistention. Colon is otherwise unremarkable. Air in the bladder is presumably iatrogenic. Uterus and ovaries are visualized. Trace pelvic free fluid. Atherosclerotic calcification of the arterial vasculature without abdominal aortic aneurysm. No pathologically enlarged lymph nodes. No worrisome lytic or sclerotic lesions. Degenerative changes are seen in the spine. Grade 1 anterolisthesis of L4 on L5, as before.  IMPRESSION: No acute findings to explain the patient's given symptoms. The appearance of slight wall thickening involving the transverse colon is likely due to underdistention.   Electronically Signed   By: Leanna Battles M.D.   On: 05/15/2013 14:37   Dg Chest 2 View  05/15/2013   CLINICAL DATA:  Diarrhea with vomiting and weakness.  EXAM: CHEST  2 VIEW  COMPARISON:  12/28/2012.  FINDINGS: Two views study shows no focal airspace consolidation. No pulmonary edema. Interstitial markings are diffusely coarsened with chronic features. The cardio pericardial silhouette is enlarged. Patient is status post aortic valve replacement. Telemetry leads overlie the chest.  IMPRESSION: No active cardiopulmonary disease.   Electronically Signed   By: Kennith Center M.D.   On: 05/15/2013 12:09   Medications: I have reviewed the patient's current medications. Scheduled Meds: . aspirin EC  81 mg Oral q morning - 10a  . atenolol  25 mg Oral Daily  . atorvastatin  10 mg Oral Daily  . cephALEXin  500 mg Oral Q8H  .  clopidogrel  75 mg Oral Q breakfast  . feeding supplement (RESOURCE BREEZE)  1 Container Oral Q24H  . heparin  5,000 Units Subcutaneous Q8H  . pantoprazole  40 mg Oral Daily  . sertraline  100 mg Oral q morning - 10a  . sucralfate  1 g Oral BID AC   Continuous Infusions: . sodium chloride 75 mL/hr at 05/16/13 1253   PRN Meds:. Assessment/Plan: Active Problems:   Acute renal failure   Protein-calorie malnutrition, severe   LOS: 2 days   #acute on chronic kidney disease. Patient with baseline creatinine 1.0, 1.55 on admission. Improved today to 1.14. Likely prerenal due to dehydration from decreased PO intake of fluids. Hyaline casts on urine microscopy further supports prerenal and argue against intrinsic renal causes.  -NS 90mL/hr  -repeat BMP -hold nephrotoxic agents including valsartan   #UTI. U/A with +nitrite, large LE, and many bacteria. Culture grew out pansensitive E coli. Patient currently having some urinary urgency.  -keflex 500mg  TID  #Chronic n/v/d. N/V/D first began after aortic valve repair. Currently followed by GI - had dilation of benign appearing stricture on 1/29. CT abdomen with no acute findings to explain symptoms. Possible psychosocial component, as patient seems to be depressed currently. Unlikely viral illness due to chronic nature of the  problem as well as normal WBC count. Unlikely mechanical obstruction with normal CT scan.  -zofran q1 PRN  -omeprazole 20mg  BID  -mechanical soft diet   #hypokalemia - K+ 3.0 on admission, now 3.9 -resolved with repletion  #chronic diastolic CHF. No evidence of pulmonary edema on exam or CXR. Patient appears euvolemic.  - holding home meds due to decreased PO intake   #HTN - BP 174/25 lying and 170/64 standing. No orthostasis.  -resume home atenolol, -hold home amlodipine. furosemide, hydralazine for now, continue to monitor and will consider restarting home meds if pressure is persistently elevated.   #CAD.  Troponins <0.3 on admission. EKG shows sinus rhythm and RBBB, unchanged from prior. No ST abnormalities.  -continue home Lipitor 10mg  qd   #Depression - mood seems depressed, however unsure of baseline. Patient states she is ready to die.  -continue home zoloft  Dispo: pending SNF placement and PT recommendations   This is a Psychologist, occupational Note.  The care of the patient was discussed with Dr. Delane Ginger and the assessment and plan formulated with their assistance.  Please see their attached note for official documentation of the daily encounter.  Kerby Nora M 05/17/2013, 11:55 AM

## 2013-05-17 NOTE — Clinical Social Work Psychosocial (Addendum)
Clinical Social Work Department BRIEF PSYCHOSOCIAL ASSESSMENT 05/17/2013  Patient:  Kaylee Schmitt, Kaylee Schmitt     Account Number:  [id number]     Admit date:  05/15/2013  Clinical Social Worker:  Donnella Sham, CLINICAL SOCIAL WORKER  Date/Time:  05/16/2013 04:11 PM  Referred by:  Physician  Date Referred:  05/16/2013 Referred for  SNF Placement   Other Referral:   Interview type:  Family Other interview type:    PSYCHOSOCIAL DATA Living Status:  FACILITY Admitted from facility:  [facility] Level of care:  Assisted Living Primary support name:  Kaylee Schmitt (Daughter) [phone] Primary support relationship to patient:  CHILD, ADULT Degree of support available:   Strong    CURRENT CONCERNS Current Concerns  Post-Acute Placement   Other Concerns:    SOCIAL WORK ASSESSMENT / PLAN CSW met with pt and family at bedside. Family present in room was Forensic psychologist (daughter-in-law), Kaylee Schmitt (pt's granddaughter) and Kaylee Schmitt (pts granddaughters husband).  pt is alert and oriented .pt is hard of hearing. CSW met with pt to make her aware that physical therapy was going to be stopping by to make sure the pt would be strong enough to go back to pts facility. CSW explained that if therapy was to recommend SNF, then Kendell Bane, LCSWA would follow up with them to start the bed search process.   Assessment/plan status:   Other assessment/ plan:   Information/referral to community resources:    PATIENT'S/FAMILY'S RESPONSE TO PLAN OF CARE: csw met with pt and pt family at bedside. pt and pt family were very thankful for CSWs visit. pt and pts family were very pleasant and supportive of pt's treatment plan.    05/16/13  I have reviewed and concur with SW intern's assessment.  CSW will follow up with family re: recommendation for SNF per PT's recommendation.  Lorie Phenix. [place], [place]  SW Intern's [place], [place] Intern  [phone]

## 2013-05-18 DIAGNOSIS — N390 Urinary tract infection, site not specified: Secondary | ICD-10-CM | POA: Diagnosis present

## 2013-05-18 DIAGNOSIS — E872 Acidosis, unspecified: Secondary | ICD-10-CM | POA: Diagnosis present

## 2013-05-18 LAB — BASIC METABOLIC PANEL
BUN: 13 mg/dL (ref 6–23)
BUN: 13 mg/dL (ref 6–23)
CALCIUM: 7.8 mg/dL — AB (ref 8.4–10.5)
CO2: 16 mEq/L — ABNORMAL LOW (ref 19–32)
CO2: 22 mEq/L (ref 19–32)
Calcium: 7.9 mg/dL — ABNORMAL LOW (ref 8.4–10.5)
Chloride: 107 mEq/L (ref 96–112)
Chloride: 103 mEq/L (ref 96–112)
Creatinine, Ser: 1 mg/dL (ref 0.50–1.10)
Creatinine, Ser: 1.09 mg/dL (ref 0.50–1.10)
GFR calc Af Amer: 51 mL/min — ABNORMAL LOW (ref >90)
GFR, EST AFRICAN AMERICAN: 57 mL/min — AB (ref >90)
GFR, EST NON AFRICAN AMERICAN: 49 mL/min — AB (ref >90)
GFR, EST NON AFRICAN AMERICAN: 44 mL/min — AB (ref >90)
GLUCOSE: 88 mg/dL (ref 70–99)
Glucose, Bld: 98 mg/dL (ref 70–99)
POTASSIUM: 3.4 meq/L — AB (ref 3.7–5.3)
Potassium: 3.1 mEq/L — ABNORMAL LOW (ref 3.7–5.3)
SODIUM: 138 meq/L (ref 137–147)
Sodium: 137 mEq/L (ref 137–147)

## 2013-05-18 LAB — GLUCOSE, CAPILLARY: Glucose-Capillary: 71 mg/dL (ref 70–99)

## 2013-05-18 MED ORDER — POTASSIUM CHLORIDE CRYS ER 20 MEQ PO TBCR
30.0000 meq | EXTENDED_RELEASE_TABLET | Freq: Two times a day (BID) | ORAL | Status: AC
  Administered 2013-05-18 – 2013-05-19 (×2): 30 meq via ORAL
  Filled 2013-05-18 (×4): qty 1

## 2013-05-18 MED ORDER — DEXTROSE-NACL 5-0.45 % IV SOLN
INTRAVENOUS | Status: AC
  Administered 2013-05-18: 14:00:00 via INTRAVENOUS

## 2013-05-18 NOTE — Evaluation (Signed)
Occupational Therapy Evaluation Patient Details Name: Kaylee Schmitt MRN: [id number] DOB: Oct 03, 1923 Today's Date: 05/18/2013 Time: 9381-0175 OT Time Calculation (min): 40 min  OT Assessment / Plan / Recommendation History of present illness Pt admit with acute renal failure and UTI.     Clinical Impression   Pt demonstrates generalized weakness and balance deficits interfering with ability to perform ADL and ADL transfers.  Pt will benefit from post acute rehab prior to return to her ALF.  Will defer OT to SNF.   OT Assessment  All further OT needs can be met in the next venue of care    Follow Up Recommendations  SNF    Barriers to Discharge      Equipment Recommendations  None recommended by OT    Recommendations for Other Services    Frequency       Precautions / Restrictions Precautions Precautions: Fall Restrictions Weight Bearing Restrictions: No   Pertinent Vitals/Pain VSS, pain with clean up of BM, RN notified    ADL  Eating/Feeding: Independent Where Assessed - Eating/Feeding: Chair Grooming: Wash/dry hands;Min guard Where Assessed - Grooming: Supported standing (leaning on sink) Upper Body Bathing: Minimal assistance Where Assessed - Upper Body Bathing: Unsupported sitting Lower Body Bathing: Maximal assistance Where Assessed - Lower Body Bathing: Unsupported sitting;Supported sit to stand Upper Body Dressing: Set up Where Assessed - Upper Body Dressing: Unsupported sitting Lower Body Dressing: Maximal assistance Where Assessed - Lower Body Dressing: Unsupported sitting;Supported sit to stand Toilet Transfer: Minimal assistance Toilet Transfer Method: Sit to stand Toilet Transfer Equipment: Regular height toilet;Grab bars Toileting - Clothing Manipulation and Hygiene: +1 Total assistance Where Assessed - Toileting Clothing Manipulation and Hygiene: Sit to stand from 3-in-1 or toilet Equipment Used: Gait belt;Rolling walker Transfers/Ambulation Related to  ADLs: min assist with RW, verbal cues to keep hands on walker, feels safer holding furniture ADL Comments: Pt with diarrhea.  Assisted with clean up and change of undergarment.    OT Diagnosis: Generalized weakness  OT Problem List: Decreased strength;Decreased activity tolerance;Impaired balance (sitting and/or standing);Decreased safety awareness;Decreased knowledge of use of DME or AE OT Treatment Interventions:     OT Goals(Current goals can be found in the care plan section) Acute Rehab OT Goals Patient Stated Goal: to get therapy and go back to [facility]  Visit Information  Last OT Received On: 05/18/13 Assistance Needed: +1 History of Present Illness: Pt admit with acute renal failure and UTI.         Prior Functioning     Home Living Family/patient expects to be discharged to:: Assisted living Home Equipment: Shower seat;Bedside commode;Wheelchair - manual;Grab bars - toilet;Grab bars - tub/shower;Hand held shower head;Cane - single point;Walker - 4 wheels Additional Comments: Assist with bathing 2x week, dressing assist prn, facility prepares medications and cooks.   Prior Function Level of Independence: Needs assistance Comments: uses walker; meals across hall  - doesnt walk long distances. Communication Communication: HOH Dominant Hand: Right         Vision/Perception Vision - History Baseline Vision: Wears glasses all the time Visual History: Macular degeneration Patient Visual Report: No change from baseline   Cognition  Cognition Arousal/Alertness: Awake/alert Behavior During Therapy: WFL for tasks assessed/performed Overall Cognitive Status: Within Functional Limits for tasks assessed    Extremity/Trunk Assessment Upper Extremity Assessment Upper Extremity Assessment: Generalized weakness Lower Extremity Assessment Lower Extremity Assessment: Defer to PT evaluation Cervical / Trunk Assessment Cervical / Trunk Assessment: Kyphotic      Mobility  Bed Mobility Overal bed mobility: Needs Assistance Bed Mobility: Supine to Sit Supine to sit: Min assist;HOB elevated General bed mobility comments: Assist to bring trunk up Transfers Overall transfer level: Needs assistance Equipment used: Rolling walker (2 wheeled) Transfers: Sit to/from Stand Sit to Stand: Min assist General transfer comment: Assist to bring hips up from sitting.     Exercise     Balance Balance Overall balance assessment: Needs assistance Sitting-balance support: Feet supported Sitting balance-Leahy Scale: Fair Postural control: Posterior lean Standing balance support: Bilateral upper extremity supported Standing balance-Leahy Scale: Poor   End of Session OT - End of Session Equipment Utilized During Treatment: Rolling walker;Gait belt Activity Tolerance: Patient tolerated treatment well Patient left: in chair;with call bell/phone within reach Nurse Communication: Mobility status (diarrhea)  GO     Malka So 05/18/2013, 3:28 PM [phone]

## 2013-05-18 NOTE — Progress Notes (Signed)
Patient alert and oriented x4 throughout shift.  Patient denies any pain or shortness of breath.  Denied nausea throughout shift, but still had minimal PO food intake.  Patient denies any questions or concerns at this time.  Will continue to monitor.

## 2013-05-18 NOTE — Progress Notes (Signed)
Subjective:   Reports one of vomiting last night. She also continues to complain of nonbloody diarrhea, at least one episode last night. However, she is tolerating her meals. No new complaints. No fevers.  Objective:   Vital signs in last 24 hours: Filed Vitals:   05/17/13 2032 05/18/13 0538 05/18/13 1007 05/18/13 1300  BP: 158/45 159/60 153/48 132/45  Pulse: 72 92 68 59  Temp: 97.5 F (36.4 C) 94.6 F (34.8 C) 97.5 F (36.4 C) 97.5 F (36.4 C)  TempSrc: Oral Oral Oral Oral  Resp: 17 18 20 20   Height:      Weight:  117 lb 8.1 oz (53.3 kg)    SpO2: 100% 100% 100% 98%   Weight change: 2 lb 6.5 oz (1.091 kg)  Intake/Output Summary (Last 24 hours) at 05/18/13 1454 Last data filed at 05/18/13 1300  Gross per 24 hour  Intake    840 ml  Output      0 ml  Net    840 ml    Physical Exam: Constitutional: Vital signs reviewed.  Seated in a chair Cardiovascular: RRR, S1, S2 present Pulmonary/Chest: normal respiratory effort, CTAB, no wheezes, rales, or rhonchi Abdominal: Soft. +BS, +epigastric pain, non-distended Extremities: Good peripheral pulses. No pedal edema. Neurological: A&O x3, moving all extremities  Skin: Warm, dry and intact. No rash Psychiatric: Normal mood and affect.   Lab Results:  BMP:  Recent Labs Lab 05/15/13 1854  05/17/13 0325 05/18/13 1210  NA  --   < > 137 138  K  --   < > 3.9 3.4*  CL  --   < > 103 107  CO2  --   < > 17* 16*  GLUCOSE  --   < > 77 88  BUN  --   < > 20 13  CREATININE  --   < > 1.14* 1.00  CALCIUM  --   < > 8.1* 7.9*  MG 1.6  --  1.6  --   PHOS 3.1  --  2.3  --   < > = values in this interval not displayed.  CBC:  Recent Labs Lab 05/15/13 1140  WBC 6.1  NEUTROABS 4.7  HGB 13.2  HCT 38.1  MCV 87.4  PLT 155    CBG:            Recent Labs Lab 05/16/13 0621 05/16/13 0648 05/17/13 0618 05/18/13 0654  GLUCAP 62* 72 79 71           LFTs:  Recent Labs Lab 05/15/13 1140 05/17/13 0325  AST 33 35  ALT  22 20  ALKPHOS 68 67  BILITOT 1.1 0.7  PROT 5.2* 4.8*  ALBUMIN 3.0* 2.6*    Pancreatic Enzymes:  Recent Labs Lab 05/15/13 1140  LIPASE 29   Cardiac Enzymes:  Recent Labs Lab 05/15/13 1140  TROPONINI <0.30     Urinalysis:  Recent Labs Lab 05/15/13 1442  COLORURINE YELLOW  LABSPEC 1.020  PHURINE 5.5  GLUCOSEU NEGATIVE  HGBUR NEGATIVE  BILIRUBINUR MODERATE*  KETONESUR 15*  PROTEINUR NEGATIVE  UROBILINOGEN 1.0  NITRITE POSITIVE*  LEUKOCYTESUR LARGE*    Micro Results: Recent Results (from the past 240 hour(s))  URINE CULTURE     Status: None   Collection Time    05/15/13  2:42 PM      Result Value Range Status   Specimen Description URINE, CATHETERIZED   Final   Special Requests NONE   Final   Culture  Setup  Time     Final   Value: 05/15/2013 22:25     Performed at [facility] Count     Final   Value: >=100,000 COLONIES/ML     Performed at [facility]   Culture     Final   Value: ESCHERICHIA COLI     Performed at [facility]   Report Status 05/16/2013 FINAL   Final   Organism ID, Bacteria ESCHERICHIA COLI   Final  MRSA PCR SCREENING     Status: None   Collection Time    05/16/13 12:38 AM      Result Value Range Status   MRSA by PCR NEGATIVE  NEGATIVE Final   Comment:            The GeneXpert MRSA Assay (FDA     approved for NASAL specimens     only), is one component of a     comprehensive MRSA colonization     surveillance program. It is not     intended to diagnose MRSA     infection nor to guide or     monitor treatment for     MRSA infections.  CLOSTRIDIUM DIFFICILE BY PCR     Status: None   Collection Time    05/17/13  3:28 AM      Result Value Range Status   C difficile by pcr NEGATIVE  NEGATIVE Final    Blood Culture:    Component Value Date/Time   SDES URINE, CATHETERIZED 05/15/2013 1442   SPECREQUEST NONE 05/15/2013 1442   CULT  Value: ESCHERICHIA COLI Performed at [facility] 05/15/2013  1442   REPTSTATUS 05/16/2013 FINAL 05/15/2013 1442    Studies/Results: No results found.  Medications:  Scheduled Meds: . aspirin EC  81 mg Oral q morning - 10a  . atenolol  25 mg Oral Daily  . atorvastatin  10 mg Oral Daily  . cephALEXin  500 mg Oral Q8H  . clopidogrel  75 mg Oral Q breakfast  . feeding supplement (RESOURCE BREEZE)  1 Container Oral Q24H  . heparin  5,000 Units Subcutaneous Q8H  . pantoprazole  40 mg Oral Daily  . potassium chloride  30 mEq Oral BID  . sertraline  100 mg Oral q morning - 10a  . sucralfate  1 g Oral BID AC   Continuous Infusions: . dextrose 5 % and 0.45% NaCl 75 mL/hr at 05/18/13 1351   PRN Meds:.  Antibiotics: Anti-infectives   Start     Dose/Rate Route Frequency Ordered Stop   05/17/13 1400  cephALEXin (KEFLEX) capsule 500 mg     500 mg Oral 3 times per day 05/17/13 1010     05/15/13 1530  cefTRIAXone (ROCEPHIN) 1 g in dextrose 5 % 50 mL IVPB     1 g 100 mL/hr over 30 Minutes Intravenous  Once 05/15/13 1518 05/15/13 1856     Antibiotics Given (last 72 hours)   Date/Time Action Medication Dose   05/17/13 1818 Given   cephALEXin (KEFLEX) capsule 500 mg 500 mg   05/18/13 0859 Given   cephALEXin (KEFLEX) capsule 500 mg 500 mg   05/18/13 1241 Given   cephALEXin (KEFLEX) capsule 500 mg 500 mg      Day of Hospitalization:  3 Consults:    Assessment/Plan:   Principal Problem:   Acute renal failure Active Problems:   CAD (coronary artery disease)   History of CVA (cerebrovascular accident)   HTN (hypertension), benign  Severe aortic stenosis   GERD (gastroesophageal reflux disease)   Physical deconditioning   Nausea & vomiting   Chronic diastolic CHF (congestive heart failure)   Protein-calorie malnutrition, severe   Metabolic acidosis   UTI (urinary tract infection)  #Chronic nausea/vomiting/diarrhea - Improving. She feels better today and tolerating meals. Pt had esophageal stricture dilation on 05/09/13.  EGD revealed  benign appearing intrinsic stenosis with normal stomach and duodenum.  GI recommended mechanical soft diet and return to office PRN. She also reports GERD symptoms.  Plan  -mechanical soft diet  -Change fluids to half normal fluids to D5 half-normal at 75 cc per hour to help with a metabolic acidosis. -continue PPI (protonix); pt on prilosec bid at home  -appreciate nutrition consult recs  # Metabolic acidosis: likely related to diarrhea which she continues to complain about. Bicarbonate level, slightly declining from 26 on admission to 16. Anion gap of 15. C. difficile was negative.  Plan. - D5 half-normal at 75 cca rate  - Check BMP tomorrow - If bicarbonate level continues to decline, will consider doing ABG to further evaluate her acidosis.  #AKI: resolved. Creatinine 1.0. continue NS 25ml/hr  - strict I/Os  - monitor electrolytes and replete as required. Potassium today was 3.4 likely caused by diarrhea. Will replace. - BMP in AM   #Urine tract infection: Pt without urinary symptoms however, UA revealed leukocytes and nitrites  and urine cultures with pansensitive Escherichia coli . Initially, treated with IV Rocephin in x2. Change to Keflex on 05/17/2013.  Plan. -Continue with the Keflex 500 mg 3 times a day. End date: 03/19/2014  #HTN - Pt orthostatic on exam.  Goal BP is <140/90. Negative orthostatics  -hold BP meds, but will restart if blood pressure continues to be elevated.   -continue IVF  #Chronic diastolic HF - Pt does not appear volume overloaded on exam  -Hold home  lasix  -daily weights  -strict I/Os  #Protein Calorie Malnutrition, Severe - , serum albumin in of 2.6. -appreciate nutrition recs - continue with  shake supplements to diet   #FEN-  D5 half NS-39ml/hr  Electrolytes-Replete as needed Diet-Clear liquids    #Dispo- Disposition is deferred at this time, awaiting improvement of current medical problems.  Patient will be discharged to his skilled  nursing facility after her metabolic acidosis, resolved and stable.     LOS: 3 days   Signed:  Dow Adolph, MD PGY-2 Internal Medicine Teaching Service Pager: [phone] 05/18/2013, 3:33 PM

## 2013-05-19 LAB — BASIC METABOLIC PANEL
BUN: 11 mg/dL (ref 6–23)
BUN: 10 mg/dL (ref 6–23)
CALCIUM: 7.6 mg/dL — AB (ref 8.4–10.5)
CO2: 19 mEq/L (ref 19–32)
CO2: 15 mEq/L — ABNORMAL LOW (ref 19–32)
CREATININE: 1.02 mg/dL (ref 0.50–1.10)
Calcium: 7.5 mg/dL — ABNORMAL LOW (ref 8.4–10.5)
Chloride: 103 mEq/L (ref 96–112)
Chloride: 102 mEq/L (ref 96–112)
Creatinine, Ser: 0.94 mg/dL (ref 0.50–1.10)
GFR calc Af Amer: 56 mL/min — ABNORMAL LOW (ref >90)
GFR, EST AFRICAN AMERICAN: 61 mL/min — AB (ref >90)
GFR, EST NON AFRICAN AMERICAN: 48 mL/min — AB (ref >90)
GFR, EST NON AFRICAN AMERICAN: 53 mL/min — AB (ref >90)
Glucose, Bld: 90 mg/dL (ref 70–99)
Glucose, Bld: 86 mg/dL (ref 70–99)
Potassium: 3.4 mEq/L — ABNORMAL LOW (ref 3.7–5.3)
Potassium: 3.6 mEq/L — ABNORMAL LOW (ref 3.7–5.3)
SODIUM: 134 meq/L — AB (ref 137–147)
SODIUM: 131 meq/L — AB (ref 137–147)

## 2013-05-19 MED ORDER — CEPHALEXIN 500 MG PO CAPS
500.0000 mg | ORAL_CAPSULE | Freq: Two times a day (BID) | ORAL | Status: DC
  Administered 2013-05-19 – 2013-05-20 (×2): 500 mg via ORAL
  Filled 2013-05-19 (×3): qty 1

## 2013-05-19 MED ORDER — IRBESARTAN 75 MG PO TABS
75.0000 mg | ORAL_TABLET | Freq: Every day | ORAL | Status: DC
  Administered 2013-05-19 – 2013-05-20 (×2): 75 mg via ORAL
  Filled 2013-05-19 (×2): qty 1

## 2013-05-19 MED ORDER — DEXTROSE-NACL 5-0.45 % IV SOLN: INTRAVENOUS | Status: DC

## 2013-05-19 MED ORDER — DEXTROSE-NACL 5-0.45 % IV SOLN
INTRAVENOUS | Status: DC
  Administered 2013-05-19 – 2013-05-20 (×2): via INTRAVENOUS

## 2013-05-19 MED ORDER — ATENOLOL 12.5 MG HALF TABLET
12.5000 mg | ORAL_TABLET | Freq: Every day | ORAL | Status: DC
  Administered 2013-05-20: 12.5 mg via ORAL
  Filled 2013-05-19: qty 1

## 2013-05-19 NOTE — Progress Notes (Addendum)
Subjective:   Pt reports feeling better this AM.  She only had one BM yesterday.  She denies any urinary symptoms.    Objective:   Vital signs in last 24 hours: Filed Vitals:   05/18/13 1300 05/18/13 2023 05/19/13 0534 05/19/13 0900  BP: 132/45 156/45 142/58 119/37  Pulse: 59 54 51 49  Temp: 97.5 F (36.4 C) 97.3 F (36.3 C) 97.2 F (36.2 C) 97.8 F (36.6 C)  TempSrc: Oral Oral Oral Oral  Resp: 20 18 16 18   Height:      Weight:   119 lb 12.8 oz (54.341 kg)   SpO2: 98% 99% 96% 100%   Weight change: 2 lb 4.7 oz (1.041 kg)  Intake/Output Summary (Last 24 hours) at 05/19/13 1008 Last data filed at 05/19/13 0900  Gross per 24 hour  Intake 686.25 ml  Output      0 ml  Net 686.25 ml    Physical Exam: Constitutional: Vital signs reviewed.  Patient is resting comfortably in bed in no acute distress and cooperative with exam.   Head: Normocephalic and atraumatic Eyes: EOMI, pale conjunctivae, no scleral icterus  Neck: Supple, Trachea midline Cardiovascular: RRR, S1, S2 present, no MRG, DP 2+ b/l Pulmonary/Chest: normal respiratory effort, CTAB, no wheezes, rales, or rhonchi Abdominal: Soft. +BS, non-tender, non-distended Neurological: A&O x3, cranial nerve II-XII are grossly intact, moving all extremities  Skin: Warm, dry and intact. No rash  Lab Results:  BMP:  Recent Labs Lab 05/15/13 1854  05/17/13 0325  05/18/13 2054 05/19/13 0422  NA  --   < > 137  < > 137 134*  K  --   < > 3.9  < > 3.1* 3.4*  CL  --   < > 103  < > 103 103  CO2  --   < > 17*  < > 22 19  GLUCOSE  --   < > 77  < > 98 90  BUN  --   < > 20  < > 13 11  CREATININE  --   < > 1.14*  < > 1.09 1.02  CALCIUM  --   < > 8.1*  < > 7.8* 7.6*  MG 1.6  --  1.6  --   --   --   PHOS 3.1  --  2.3  --   --   --   < > = values in this interval not displayed.  CBC:  Recent Labs Lab 05/15/13 1140  WBC 6.1  NEUTROABS 4.7  HGB 13.2  HCT 38.1  MCV 87.4  PLT 155    Coagulation: No results found  for this basename: LABPROT, INR,  in the last 168 hours  CBG:            Recent Labs Lab 05/16/13 0621 05/16/13 0648 05/17/13 0618 05/18/13 0654  GLUCAP 62* 72 79 71           HA1C:      No results found for this basename: HGBA1C,  in the last 168 hours  Lipid Panel: No results found for this basename: CHOL, HDL, LDLCALC, TRIG, CHOLHDL, LDLDIRECT,  in the last 168 hours  LFTs:  Recent Labs Lab 05/15/13 1140 05/17/13 0325  AST 33 35  ALT 22 20  ALKPHOS 68 67  BILITOT 1.1 0.7  PROT 5.2* 4.8*  ALBUMIN 3.0* 2.6*    Pancreatic Enzymes:  Recent Labs Lab 05/15/13 1140  LIPASE 29    Ammonia: No results found  for this basename: AMMONIA,  in the last 168 hours  Cardiac Enzymes:  Recent Labs Lab 05/15/13 1140  TROPONINI <0.30   Lab Results  Component Value Date   CKTOTAL 52 01/25/2010   CKMB 0.6 01/25/2010   TROPONINI <0.30 05/15/2013    BNP: No results found for this basename: PROBNP,  in the last 168 hours  D-Dimer: No results found for this basename: DDIMER,  in the last 168 hours  Urinalysis:  Recent Labs Lab 05/15/13 1442  COLORURINE YELLOW  LABSPEC 1.020  PHURINE 5.5  GLUCOSEU NEGATIVE  HGBUR NEGATIVE  BILIRUBINUR MODERATE*  KETONESUR 15*  PROTEINUR NEGATIVE  UROBILINOGEN 1.0  NITRITE POSITIVE*  LEUKOCYTESUR LARGE*    Micro Results: Recent Results (from the past 240 hour(s))  URINE CULTURE     Status: None   Collection Time    05/15/13  2:42 PM      Result Value Range Status   Specimen Description URINE, CATHETERIZED   Final   Special Requests NONE   Final   Culture  Setup Time     Final   Value: 05/15/2013 22:25     Performed at [facility] Count     Final   Value: >=100,000 COLONIES/ML     Performed at [facility]   Culture     Final   Value: ESCHERICHIA COLI     Performed at [facility]   Report Status 05/16/2013 FINAL   Final   Organism ID, Bacteria ESCHERICHIA COLI   Final  MRSA PCR  SCREENING     Status: None   Collection Time    05/16/13 12:38 AM      Result Value Range Status   MRSA by PCR NEGATIVE  NEGATIVE Final   Comment:            The GeneXpert MRSA Assay (FDA     approved for NASAL specimens     only), is one component of a     comprehensive MRSA colonization     surveillance program. It is not     intended to diagnose MRSA     infection nor to guide or     monitor treatment for     MRSA infections.  CLOSTRIDIUM DIFFICILE BY PCR     Status: None   Collection Time    05/17/13  3:28 AM      Result Value Range Status   C difficile by pcr NEGATIVE  NEGATIVE Final    Blood Culture:    Component Value Date/Time   SDES URINE, CATHETERIZED 05/15/2013 1442   SPECREQUEST NONE 05/15/2013 1442   CULT  Value: ESCHERICHIA COLI Performed at [facility] 05/15/2013 1442   REPTSTATUS 05/16/2013 FINAL 05/15/2013 1442    Studies/Results: No results found.  Medications:  Scheduled Meds: . aspirin EC  81 mg Oral q morning - 10a  . [START ON 05/20/2013] atenolol  12.5 mg Oral Daily  . atorvastatin  10 mg Oral Daily  . cephALEXin  500 mg Oral Q8H  . clopidogrel  75 mg Oral Q breakfast  . feeding supplement (RESOURCE BREEZE)  1 Container Oral Q24H  . heparin  5,000 Units Subcutaneous Q8H  . pantoprazole  40 mg Oral Daily  . potassium chloride  30 mEq Oral BID  . sertraline  100 mg Oral q morning - 10a  . sucralfate  1 g Oral BID AC   Continuous Infusions: . dextrose 5 % and 0.45% NaCl  PRN Meds:.  Antibiotics: Anti-infectives   Start     Dose/Rate Route Frequency Ordered Stop   05/17/13 1400  cephALEXin (KEFLEX) capsule 500 mg     500 mg Oral 3 times per day 05/17/13 1010     05/15/13 1530  cefTRIAXone (ROCEPHIN) 1 g in dextrose 5 % 50 mL IVPB     1 g 100 mL/hr over 30 Minutes Intravenous  Once 05/15/13 1518 05/15/13 1856     Antibiotics Given (last 72 hours)   Date/Time Action Medication Dose   05/17/13 1818 Given   cephALEXin (KEFLEX) capsule  500 mg 500 mg   05/18/13 0859 Given   cephALEXin (KEFLEX) capsule 500 mg 500 mg   05/18/13 1241 Given   cephALEXin (KEFLEX) capsule 500 mg 500 mg   05/18/13 1829 Given  [not available from pharmacy at scheduled time]   cephALEXin (KEFLEX) capsule 500 mg 500 mg      Day of Hospitalization:  4 Consults:    Assessment/Plan:   Principal Problem:   Acute renal failure Active Problems:   CAD (coronary artery disease)   History of CVA (cerebrovascular accident)   HTN (hypertension), benign   Severe aortic stenosis   GERD (gastroesophageal reflux disease)   Physical deconditioning   Nausea & vomiting   Chronic diastolic CHF (congestive heart failure)   Protein-calorie malnutrition, severe   Metabolic acidosis   UTI (urinary tract infection)  #Chronic nausea/vomiting/diarrhea - Pt reports feeling better this AM.  She had one episode of diarrhea yesterday. Bicarbonate was down yesterday and restarted D5 1/2 NS @75ml /hr.   -continue mechanical soft diet  -continue -continue PPI (protonix); pt on prilosec bid at home  -strict documentation of any episodes of vomiting/diarrhea  -appreciate nutrition consult recs -check orthostatics -recheck BMP in AM  #Acute on CKD3 - Resolved. Creatinine is trended down from 1.55 on admission to 1.02 today with IVF.  Mg/phos wnl.  -d/c IVF -strict I/Os  -avoid nephrotoxic meds    Lab Results  Component Value Date   CREATININE 1.02 05/19/2013   #Metabolic acidosis - Pt bicarb has declined most likely d/t dilutional acidosis.  Pt does not have significant diarrhea that could be contributing.   -BMP in AM  #UTI - Pt denies urinary symptoms.  UA revealed leukocytes and nitrites.  Culture>100,000 E.coli.  -keflex 500mg  q12h x 7 days   #HTN - Pt orthostasis resolved and denies dizziness.  Goal BP is more liberal <150/90. -d/c IVF -d/c atorvastatin which may exacerbate dyspepsia -decrease atenolol to 12.5mg  daily d/t bradycardia -begin  irbesartan 75mg  daily   #Chronic diastolic HF - Pt does not appear volume overloaded on exam. -daily weights  -strict I/Os  #Protein Calorie Malnutrition, Severe -  -appreciate nutrition recs -added nutritional shake supplements to diet  #FEN- d/c IVF  Electrolytes-Replete as needed Diet-Dysphagia 3 per GI recommendations   #Dispo- Disposition is deferred at this time, awaiting improvement of current medical problems.  Anticipated discharge in approximately 1-2 day(s).    LOS: 4 days   Boykin Peek, MD PGY-1, Internal Medicine  [phone] (7AM-5PM Mon-Fri) 05/19/2013, 10:08 AM

## 2013-05-19 NOTE — Progress Notes (Signed)
Medical Student Daily Progress Note  Subjective: [name] has no complaints this morning and is feeling well. She had 1 episode of loose stools yesterday but no vomiting. She continues to feel nauseas after eating but is going to try to eat more today.   Objective: Vital signs in last 24 hours: Filed Vitals:   05/18/13 1300 05/18/13 2023 05/19/13 0534 05/19/13 0900  BP: 132/45 156/45 142/58 119/37  Pulse: 59 54 51 49  Temp: 97.5 F (36.4 C) 97.3 F (36.3 C) 97.2 F (36.2 C) 97.8 F (36.6 C)  TempSrc: Oral Oral Oral Oral  Resp: 20 18 16 18   Height:      Weight:   54.341 kg (119 lb 12.8 oz)   SpO2: 98% 99% 96% 100%   Weight change: 1.041 kg (2 lb 4.7 oz)  Intake/Output Summary (Last 24 hours) at 05/19/13 1000 Last data filed at 05/19/13 0900  Gross per 24 hour  Intake 686.25 ml  Output      0 ml  Net 686.25 ml   Physical Exam: General appearance: alert, in no acute distress, pale  Eyes: conjunctivae/corneas clear. PERRL, EOM's intact  Throat: moist mucus membranes, lips, mucosa, and tongue normal  Lungs: clear to auscultation bilaterally  Heart: regular rate and rhythm, S1, S2 normal, no murmur, click, rub or gallop  Abdomen: soft, nondistended tender to palpation in all 4 quadrants, most significant in RLQ/RUQ. Normal bowel sounds, no masses  Extremities: cold, no evidence of venous stasis  Pulses: 1+ DP pulses bilaterally  Neurologic: CNs grossly intact   Lab Results: Results for orders placed during the hospital encounter of 05/15/13  URINE CULTURE      Result Value Range   Specimen Description URINE, CATHETERIZED     Special Requests NONE     Culture  Setup Time       Value: 05/15/2013 22:25     Performed at [facility]   Colony Count       Value: >=100,000 COLONIES/ML     Performed at [facility]   Culture       Value: ESCHERICHIA COLI     Performed at [facility]   Report Status 05/16/2013 FINAL     Organism ID, Bacteria  ESCHERICHIA COLI    MRSA PCR SCREENING      Result Value Range   MRSA by PCR NEGATIVE  NEGATIVE  CLOSTRIDIUM DIFFICILE BY PCR      Result Value Range   C difficile by pcr NEGATIVE  NEGATIVE  URINALYSIS W MICROSCOPIC + REFLEX CULTURE      Result Value Range   Color, Urine YELLOW  YELLOW   APPearance CLOUDY (*) CLEAR   Specific Gravity, Urine 1.020  1.005 - 1.030   pH 5.5  5.0 - 8.0   Glucose, UA NEGATIVE  NEGATIVE mg/dL   Hgb urine dipstick NEGATIVE  NEGATIVE   Bilirubin Urine MODERATE (*) NEGATIVE   Ketones, ur 15 (*) NEGATIVE mg/dL   Protein, ur NEGATIVE  NEGATIVE mg/dL   Urobilinogen, UA 1.0  0.0 - 1.0 mg/dL   Nitrite POSITIVE (*) NEGATIVE   Leukocytes, UA LARGE (*) NEGATIVE   WBC, UA 21-50  <3 WBC/hpf   Bacteria, UA MANY (*) RARE   Squamous Epithelial / LPF RARE  RARE   Casts HYALINE CASTS (*) NEGATIVE  CBC WITH DIFFERENTIAL      Result Value Range   WBC 6.1  4.0 - 10.5 K/uL   RBC 4.36  3.87 -  5.11 MIL/uL   Hemoglobin 13.2  12.0 - 15.0 g/dL   HCT 16.1  09.6 - 04.5 %   MCV 87.4  78.0 - 100.0 fL   MCH 30.3  26.0 - 34.0 pg   MCHC 34.6  30.0 - 36.0 g/dL   RDW 40.9 (*) 81.1 - 91.4 %   Platelets 155  150 - 400 K/uL   Neutrophils Relative % 77  43 - 77 %   Neutro Abs 4.7  1.7 - 7.7 K/uL   Lymphocytes Relative 13  12 - 46 %   Lymphs Abs 0.8  0.7 - 4.0 K/uL   Monocytes Relative 8  3 - 12 %   Monocytes Absolute 0.5  0.1 - 1.0 K/uL   Eosinophils Relative 2  0 - 5 %   Eosinophils Absolute 0.1  0.0 - 0.7 K/uL   Basophils Relative 0  0 - 1 %   Basophils Absolute 0.0  0.0 - 0.1 K/uL  COMPREHENSIVE METABOLIC PANEL      Result Value Range   Sodium 141  137 - 147 mEq/L   Potassium 3.0 (*) 3.7 - 5.3 mEq/L   Chloride 98  96 - 112 mEq/L   CO2 26  19 - 32 mEq/L   Glucose, Bld 124 (*) 70 - 99 mg/dL   BUN 28 (*) 6 - 23 mg/dL   Creatinine, Ser 7.82 (*) 0.50 - 1.10 mg/dL   Calcium 8.8  8.4 - 95.6 mg/dL   Total Protein 5.2 (*) 6.0 - 8.3 g/dL   Albumin 3.0 (*) 3.5 - 5.2 g/dL   AST 33   0 - 37 U/L   ALT 22  0 - 35 U/L   Alkaline Phosphatase 68  39 - 117 U/L   Total Bilirubin 1.1  0.3 - 1.2 mg/dL   GFR calc non Af Amer 29 (*) >90 mL/min   GFR calc Af Amer 34 (*) >90 mL/min  LIPASE, BLOOD      Result Value Range   Lipase 29  11 - 59 U/L  TROPONIN I      Result Value Range   Troponin I <0.30  <0.30 ng/mL  BASIC METABOLIC PANEL      Result Value Range   Sodium 138  137 - 147 mEq/L   Potassium 4.2  3.7 - 5.3 mEq/L   Chloride 101  96 - 112 mEq/L   CO2 21  19 - 32 mEq/L   Glucose, Bld 68 (*) 70 - 99 mg/dL   BUN 26 (*) 6 - 23 mg/dL   Creatinine, Ser 2.13 (*) 0.50 - 1.10 mg/dL   Calcium 7.8 (*) 8.4 - 10.5 mg/dL   GFR calc non Af Amer 34 (*) >90 mL/min   GFR calc Af Amer 40 (*) >90 mL/min  TSH      Result Value Range   TSH 4.503 (*) 0.350 - 4.500 uIU/mL  MAGNESIUM      Result Value Range   Magnesium 1.6  1.5 - 2.5 mg/dL  PHOSPHORUS      Result Value Range   Phosphorus 3.1  2.3 - 4.6 mg/dL  GLUCOSE, CAPILLARY      Result Value Range   Glucose-Capillary 62 (*) 70 - 99 mg/dL   Comment 1 Notify RN    GLUCOSE, CAPILLARY      Result Value Range   Glucose-Capillary 72  70 - 99 mg/dL  T4, FREE      Result Value Range   Free T4 0.99  0.80 -  1.80 ng/dL  COMPREHENSIVE METABOLIC PANEL      Result Value Range   Sodium 137  137 - 147 mEq/L   Potassium 3.9  3.7 - 5.3 mEq/L   Chloride 103  96 - 112 mEq/L   CO2 17 (*) 19 - 32 mEq/L   Glucose, Bld 77  70 - 99 mg/dL   BUN 20  6 - 23 mg/dL   Creatinine, Ser 1.61 (*) 0.50 - 1.10 mg/dL   Calcium 8.1 (*) 8.4 - 10.5 mg/dL   Total Protein 4.8 (*) 6.0 - 8.3 g/dL   Albumin 2.6 (*) 3.5 - 5.2 g/dL   AST 35  0 - 37 U/L   ALT 20  0 - 35 U/L   Alkaline Phosphatase 67  39 - 117 U/L   Total Bilirubin 0.7  0.3 - 1.2 mg/dL   GFR calc non Af Amer 42 (*) >90 mL/min   GFR calc Af Amer 49 (*) >90 mL/min  MAGNESIUM      Result Value Range   Magnesium 1.6  1.5 - 2.5 mg/dL  PHOSPHORUS      Result Value Range   Phosphorus 2.3  2.3 -  4.6 mg/dL  HIV ANTIBODY (ROUTINE TESTING)      Result Value Range   HIV NON REACTIVE  NON REACTIVE  HEPATITIS PANEL, ACUTE      Result Value Range   Hepatitis B Surface Ag NEGATIVE  NEGATIVE   HCV Ab NEGATIVE  NEGATIVE   Hep A IgM NON REACTIVE  NON REACTIVE   Hep B C IgM NON REACTIVE  NON REACTIVE  GLUCOSE, CAPILLARY      Result Value Range   Glucose-Capillary 79  70 - 99 mg/dL  GLUCOSE, CAPILLARY      Result Value Range   Glucose-Capillary 71  70 - 99 mg/dL  BASIC METABOLIC PANEL      Result Value Range   Sodium 138  137 - 147 mEq/L   Potassium 3.4 (*) 3.7 - 5.3 mEq/L   Chloride 107  96 - 112 mEq/L   CO2 16 (*) 19 - 32 mEq/L   Glucose, Bld 88  70 - 99 mg/dL   BUN 13  6 - 23 mg/dL   Creatinine, Ser 0.96  0.50 - 1.10 mg/dL   Calcium 7.9 (*) 8.4 - 10.5 mg/dL   GFR calc non Af Amer 49 (*) >90 mL/min   GFR calc Af Amer 57 (*) >90 mL/min  BASIC METABOLIC PANEL      Result Value Range   Sodium 137  137 - 147 mEq/L   Potassium 3.1 (*) 3.7 - 5.3 mEq/L   Chloride 103  96 - 112 mEq/L   CO2 22  19 - 32 mEq/L   Glucose, Bld 98  70 - 99 mg/dL   BUN 13  6 - 23 mg/dL   Creatinine, Ser 0.45  0.50 - 1.10 mg/dL   Calcium 7.8 (*) 8.4 - 10.5 mg/dL   GFR calc non Af Amer 44 (*) >90 mL/min   GFR calc Af Amer 51 (*) >90 mL/min  BASIC METABOLIC PANEL      Result Value Range   Sodium 134 (*) 137 - 147 mEq/L   Potassium 3.4 (*) 3.7 - 5.3 mEq/L   Chloride 103  96 - 112 mEq/L   CO2 19  19 - 32 mEq/L   Glucose, Bld 90  70 - 99 mg/dL   BUN 11  6 - 23 mg/dL   Creatinine, Ser  1.02  0.50 - 1.10 mg/dL   Calcium 7.6 (*) 8.4 - 10.5 mg/dL   GFR calc non Af Amer 48 (*) >90 mL/min   GFR calc Af Amer 56 (*) >90 mL/min  CG4 I-STAT (LACTIC ACID)      Result Value Range   Lactic Acid, Venous 2.10  0.5 - 2.2 mmol/L   Micro Results: Recent Results (from the past 240 hour(s))  URINE CULTURE     Status: None   Collection Time    05/15/13  2:42 PM      Result Value Range Status   Specimen Description  URINE, CATHETERIZED   Final   Special Requests NONE   Final   Culture  Setup Time     Final   Value: 05/15/2013 22:25     Performed at [facility] Count     Final   Value: >=100,000 COLONIES/ML     Performed at [facility]   Culture     Final   Value: ESCHERICHIA COLI     Performed at [facility]   Report Status 05/16/2013 FINAL   Final   Organism ID, Bacteria ESCHERICHIA COLI   Final  MRSA PCR SCREENING     Status: None   Collection Time    05/16/13 12:38 AM      Result Value Range Status   MRSA by PCR NEGATIVE  NEGATIVE Final   Comment:            The GeneXpert MRSA Assay (FDA     approved for NASAL specimens     only), is one component of a     comprehensive MRSA colonization     surveillance program. It is not     intended to diagnose MRSA     infection nor to guide or     monitor treatment for     MRSA infections.  CLOSTRIDIUM DIFFICILE BY PCR     Status: None   Collection Time    05/17/13  3:28 AM      Result Value Range Status   C difficile by pcr NEGATIVE  NEGATIVE Final   Studies/Results: No results found. Medications: I have reviewed the patient's current medications. Scheduled Meds: . aspirin EC  81 mg Oral q morning - 10a  . atenolol  25 mg Oral Daily  . atorvastatin  10 mg Oral Daily  . cephALEXin  500 mg Oral Q8H  . clopidogrel  75 mg Oral Q breakfast  . feeding supplement (RESOURCE BREEZE)  1 Container Oral Q24H  . heparin  5,000 Units Subcutaneous Q8H  . pantoprazole  40 mg Oral Daily  . potassium chloride  30 mEq Oral BID  . sertraline  100 mg Oral q morning - 10a  . sucralfate  1 g Oral BID AC   Continuous Infusions: . dextrose 5 % and 0.45% NaCl     PRN Meds:. Assessment/Plan: Principal Problem:   Acute renal failure Active Problems:   CAD (coronary artery disease)   History of CVA (cerebrovascular accident)   HTN (hypertension), benign   Severe aortic stenosis   GERD (gastroesophageal reflux  disease)   Physical deconditioning   Nausea & vomiting   Chronic diastolic CHF (congestive heart failure)   Protein-calorie malnutrition, severe   Metabolic acidosis   UTI (urinary tract infection)   LOS: 4 days   #Chronic n/v/d - improving. Currently followed by GI - had dilation of benign appearing stricture on 1/29.  -zofran  q1 PRN  -protonix 20mg  BID  -mechanical soft diet  -continue D5 1/2 NS at 75 cc/hr to help with a metabolic acidosis.  #UTI. U/A with +nitrite, large LE, and many bacteria. Culture grew out pansensitive E coli. Patient currently having some urinary urgency.  -keflex 500mg  TID   #AKI - resolved. Patient's creatinine returned to baseline of 1.0 -D5 1/2 NS 75 cc/hr -repeat BMP -hold nephrotoxic agents including valsartan   #hypokalemia - K+ 3.4 with normal Mg/Phos -replete as needed  #chronic diastolic CHF. No evidence of pulmonary edema on exam or CXR. Patient appears euvolemic.  - holding home lasix  #HTN - BP 119/37. Orthostatics pending. Pulse in the 40's-50's -Decrease dosage of home atenolol to 12.5 qd -hold home amlodipine. furosemide, hydralazine for now  #CAD. Troponins <0.3 on admission. EKG shows sinus rhythm and RBBB, unchanged from prior. No ST abnormalities.  -continue home Lipitor 10mg  qd   #Depression - mood seems depressed, however unsure of baseline. Patient states she is ready to die.  -continue home zoloft  Dispo: pending SNF placement and PT recommendations   This is a Psychologist, occupational Note.  The care of the patient was discussed with Dr. Delane Ginger and the assessment and plan formulated with their assistance.  Please see their attached note for official documentation of the daily encounter.  Kerby Nora M 05/19/2013, 10:00 AM

## 2013-05-19 NOTE — Progress Notes (Signed)
Patient sustaining the the 40's. She is asleep but arousable and otherwise asymptomatic. MD on call paged, awaiting call back. Will continue to monitor. Blood pressure 142/58, pulse 51, temperature 97.2 F (36.2 C), temperature source Oral, resp. rate 16, height 5' (1.524 m), weight 54.341 kg (119 lb 12.8 oz), SpO2 96.00%. Troy SineWalker, Lyrah Bradt M

## 2013-05-20 DIAGNOSIS — E872 Acidosis, unspecified: Secondary | ICD-10-CM

## 2013-05-20 LAB — BASIC METABOLIC PANEL
BUN: 8 mg/dL (ref 6–23)
CO2: 19 mEq/L (ref 19–32)
Calcium: 7.4 mg/dL — ABNORMAL LOW (ref 8.4–10.5)
Chloride: 105 mEq/L (ref 96–112)
Creatinine, Ser: 0.96 mg/dL (ref 0.50–1.10)
GFR calc Af Amer: 60 mL/min — ABNORMAL LOW (ref >90)
GFR, EST NON AFRICAN AMERICAN: 52 mL/min — AB (ref >90)
Glucose, Bld: 80 mg/dL (ref 70–99)
POTASSIUM: 4.1 meq/L (ref 3.7–5.3)
SODIUM: 135 meq/L — AB (ref 137–147)

## 2013-05-20 LAB — GI PATHOGEN PANEL BY PCR, STOOL
C DIFFICILE TOXIN A/B: NEGATIVE
CRYPTOSPORIDIUM BY PCR: NEGATIVE
Campylobacter by PCR: NEGATIVE
E COLI (ETEC) LT/ST: NEGATIVE
E coli (STEC): NEGATIVE
E coli 0157 by PCR: NEGATIVE
G LAMBLIA BY PCR: NEGATIVE
Norovirus GI/GII: NEGATIVE
Rotavirus A by PCR: NEGATIVE
Salmonella by PCR: NEGATIVE
Shigella by PCR: NEGATIVE

## 2013-05-20 LAB — GLUCOSE, CAPILLARY: GLUCOSE-CAPILLARY: 81 mg/dL (ref 70–99)

## 2013-05-20 MED ORDER — IRBESARTAN 75 MG PO TABS: 75.0000 mg | ORAL_TABLET | Freq: Every day | ORAL | Status: DC

## 2013-05-20 MED ORDER — DIPHENOXYLATE-ATROPINE 2.5-0.025 MG PO TABS: 1.0000 | ORAL_TABLET | Freq: Four times a day (QID) | ORAL | Status: DC | PRN

## 2013-05-20 MED ORDER — ATENOLOL 12.5 MG HALF TABLET: 25.0000 mg | ORAL_TABLET | Freq: Every day | ORAL | Status: AC

## 2013-05-20 NOTE — Progress Notes (Signed)
IV site in left hand leaking.  Patient remains on continuous fluids.  IV removed.  Patient's arms assessed for additional insertion sites by two RNs, one unsuccessful attempt in left forearm.  IV team paged.  Will continue to monitor.

## 2013-05-20 NOTE — Progress Notes (Signed)
  Date: 05/20/2013  Patient name: Kaylee Schmitt  Medical record number: [id number]  Date of birth: 12/17/1923   This patient has been seen and the plan of care was discussed with the house staff. Please see their note for complete details. I concur with their findings with the following additions/corrections:   Patient continues to improve symptomatically she is comfortable lying in bed. She states she still is having 2-3 very loose bowel movements per day.  She is C. difficile PCR negative. She hasn't had problems with metabolic acidosis that we thought might be related to her diarrhea.  I would start her on some Lomotil as needed for diarrhea we'll stop her D5 half-normal saline.  I feel that she should be stable for discharge today but will need a repeat metabolic panel performed later in the week.  Randall Hissornelius N Van Dam, MD 05/20/2013, 12:15 PM

## 2013-05-20 NOTE — Progress Notes (Signed)
Medicare Important Message given. Shalayne Leach J. Arcelia Pals, RN, BSN, NCM [phone].   

## 2013-05-20 NOTE — Progress Notes (Signed)
I repeated the critical or key portions of the exam.  I confirmed/revised the medical student's history, exam, assessment and plan.   

## 2013-05-20 NOTE — Discharge Summary (Signed)
  Date: 05/20/2013  Patient name: Kaylee Schmitt  Medical record number: [id number]  Date of birth: 05/27/1923   This patient has been seen and the plan of care was discussed with the house staff. Please see their note for complete details. I concur with their findings with the following additions/corrections:  See my addendum from note on date of discharge.  Randall Hissornelius N Van Dam, MD 05/20/2013, 3:29 PM

## 2013-05-20 NOTE — Progress Notes (Signed)
Patient alert and oriented x4 throughout shift.  Vital signs stable.  Patient's daughter in law at bedside.  Patient to be discharged [facility].  Family aware.  IV removed prior to discharge; IV site clean, dry, and intact.  Report called to [facility] at [facility].  Patient and family state they have no questions or concerns at this time.  Family transporting patient to [facility].

## 2013-05-20 NOTE — Progress Notes (Signed)
Medical Student Daily Progress Note  Subjective: Kaylee Schmitt has no complaints this morning and is feeling well. She had 2-3 episode of diarrhea yesterday but no vomiting. She continues to feel nauseas after eating only ate a couple bites of her breakfast.   Objective: Vital signs in last 24 hours: Filed Vitals:   05/19/13 1300 05/19/13 2111 05/20/13 0555 05/20/13 0926  BP: 138/32 131/49 122/57 135/49  Pulse: 55 55 57 60  Temp:  97.8 F (36.6 C) 97.6 F (36.4 C)   TempSrc: Oral Oral Oral   Resp: 18 18 17 18   Height:      Weight:   55.52 kg (122 lb 6.4 oz)   SpO2: 100% 98% 98% 100%   Weight change: 1.179 kg (2 lb 9.6 oz)  Intake/Output Summary (Last 24 hours) at 05/20/13 1610 Last data filed at 05/20/13 0932  Gross per 24 hour  Intake 1773.75 ml  Output      0 ml  Net 1773.75 ml   Physical Exam: General appearance: alert, in no acute distress, pale  Eyes: conjunctivae/corneas clear. PERRL, EOM's intact  Throat: moist mucus membranes, lips, mucosa, and tongue normal  Lungs: clear to auscultation bilaterally  Heart: regular rate and rhythm, S1, S2 normal, no murmur, click, rub or gallop  Abdomen: soft, nondistended tender to palpation in all 4 quadrants, most significant in RLQ/RUQ. Normal bowel sounds, no masses  Extremities: cold, no evidence of venous stasis  Pulses: 1+ DP pulses bilaterally  Neurologic: CNs grossly intact   Lab Results: Results for orders placed during the hospital encounter of 05/15/13  URINE CULTURE      Result Value Range   Specimen Description URINE, CATHETERIZED     Special Requests NONE     Culture  Setup Time       Value: 05/15/2013 22:25     Performed at [facility] Count       Value: >=100,000 COLONIES/ML     Performed at [facility]   Culture       Value: ESCHERICHIA COLI     Performed at [facility]   Report Status 05/16/2013 FINAL     Organism ID, Bacteria ESCHERICHIA COLI    MRSA PCR SCREENING       Result Value Range   MRSA by PCR NEGATIVE  NEGATIVE  CLOSTRIDIUM DIFFICILE BY PCR      Result Value Range   C difficile by pcr NEGATIVE  NEGATIVE  URINALYSIS W MICROSCOPIC + REFLEX CULTURE      Result Value Range   Color, Urine YELLOW  YELLOW   APPearance CLOUDY (*) CLEAR   Specific Gravity, Urine 1.020  1.005 - 1.030   pH 5.5  5.0 - 8.0   Glucose, UA NEGATIVE  NEGATIVE mg/dL   Hgb urine dipstick NEGATIVE  NEGATIVE   Bilirubin Urine MODERATE (*) NEGATIVE   Ketones, ur 15 (*) NEGATIVE mg/dL   Protein, ur NEGATIVE  NEGATIVE mg/dL   Urobilinogen, UA 1.0  0.0 - 1.0 mg/dL   Nitrite POSITIVE (*) NEGATIVE   Leukocytes, UA LARGE (*) NEGATIVE   WBC, UA 21-50  <3 WBC/hpf   Bacteria, UA MANY (*) RARE   Squamous Epithelial / LPF RARE  RARE   Casts HYALINE CASTS (*) NEGATIVE  CBC WITH DIFFERENTIAL      Result Value Range   WBC 6.1  4.0 - 10.5 K/uL   RBC 4.36  3.87 - 5.11 MIL/uL   Hemoglobin 13.2  12.0 - 15.0 g/dL   HCT 16.138.1  09.636.0 - 04.546.0 %   MCV 87.4  78.0 - 100.0 fL   MCH 30.3  26.0 - 34.0 pg   MCHC 34.6  30.0 - 36.0 g/dL   RDW 40.917.6 (*) 81.111.5 - 91.415.5 %   Platelets 155  150 - 400 K/uL   Neutrophils Relative % 77  43 - 77 %   Neutro Abs 4.7  1.7 - 7.7 K/uL   Lymphocytes Relative 13  12 - 46 %   Lymphs Abs 0.8  0.7 - 4.0 K/uL   Monocytes Relative 8  3 - 12 %   Monocytes Absolute 0.5  0.1 - 1.0 K/uL   Eosinophils Relative 2  0 - 5 %   Eosinophils Absolute 0.1  0.0 - 0.7 K/uL   Basophils Relative 0  0 - 1 %   Basophils Absolute 0.0  0.0 - 0.1 K/uL  COMPREHENSIVE METABOLIC PANEL      Result Value Range   Sodium 141  137 - 147 mEq/L   Potassium 3.0 (*) 3.7 - 5.3 mEq/L   Chloride 98  96 - 112 mEq/L   CO2 26  19 - 32 mEq/L   Glucose, Bld 124 (*) 70 - 99 mg/dL   BUN 28 (*) 6 - 23 mg/dL   Creatinine, Ser 7.821.55 (*) 0.50 - 1.10 mg/dL   Calcium 8.8  8.4 - 95.610.5 mg/dL   Total Protein 5.2 (*) 6.0 - 8.3 g/dL   Albumin 3.0 (*) 3.5 - 5.2 g/dL   AST 33  0 - 37 U/L   ALT 22  0 - 35 U/L    Alkaline Phosphatase 68  39 - 117 U/L   Total Bilirubin 1.1  0.3 - 1.2 mg/dL   GFR calc non Af Amer 29 (*) >90 mL/min   GFR calc Af Amer 34 (*) >90 mL/min  LIPASE, BLOOD      Result Value Range   Lipase 29  11 - 59 U/L  TROPONIN I      Result Value Range   Troponin I <0.30  <0.30 ng/mL  BASIC METABOLIC PANEL      Result Value Range   Sodium 138  137 - 147 mEq/L   Potassium 4.2  3.7 - 5.3 mEq/L   Chloride 101  96 - 112 mEq/L   CO2 21  19 - 32 mEq/L   Glucose, Bld 68 (*) 70 - 99 mg/dL   BUN 26 (*) 6 - 23 mg/dL   Creatinine, Ser 2.131.35 (*) 0.50 - 1.10 mg/dL   Calcium 7.8 (*) 8.4 - 10.5 mg/dL   GFR calc non Af Amer 34 (*) >90 mL/min   GFR calc Af Amer 40 (*) >90 mL/min  TSH      Result Value Range   TSH 4.503 (*) 0.350 - 4.500 uIU/mL  MAGNESIUM      Result Value Range   Magnesium 1.6  1.5 - 2.5 mg/dL  PHOSPHORUS      Result Value Range   Phosphorus 3.1  2.3 - 4.6 mg/dL  GLUCOSE, CAPILLARY      Result Value Range   Glucose-Capillary 62 (*) 70 - 99 mg/dL   Comment 1 Notify RN    GLUCOSE, CAPILLARY      Result Value Range   Glucose-Capillary 72  70 - 99 mg/dL  T4, FREE      Result Value Range   Free T4 0.99  0.80 - 1.80 ng/dL  COMPREHENSIVE METABOLIC PANEL  Result Value Range   Sodium 137  137 - 147 mEq/L   Potassium 3.9  3.7 - 5.3 mEq/L   Chloride 103  96 - 112 mEq/L   CO2 17 (*) 19 - 32 mEq/L   Glucose, Bld 77  70 - 99 mg/dL   BUN 20  6 - 23 mg/dL   Creatinine, Ser 1.61 (*) 0.50 - 1.10 mg/dL   Calcium 8.1 (*) 8.4 - 10.5 mg/dL   Total Protein 4.8 (*) 6.0 - 8.3 g/dL   Albumin 2.6 (*) 3.5 - 5.2 g/dL   AST 35  0 - 37 U/L   ALT 20  0 - 35 U/L   Alkaline Phosphatase 67  39 - 117 U/L   Total Bilirubin 0.7  0.3 - 1.2 mg/dL   GFR calc non Af Amer 42 (*) >90 mL/min   GFR calc Af Amer 49 (*) >90 mL/min  MAGNESIUM      Result Value Range   Magnesium 1.6  1.5 - 2.5 mg/dL  PHOSPHORUS      Result Value Range   Phosphorus 2.3  2.3 - 4.6 mg/dL  HIV ANTIBODY (ROUTINE  TESTING)      Result Value Range   HIV NON REACTIVE  NON REACTIVE  HEPATITIS PANEL, ACUTE      Result Value Range   Hepatitis B Surface Ag NEGATIVE  NEGATIVE   HCV Ab NEGATIVE  NEGATIVE   Hep A IgM NON REACTIVE  NON REACTIVE   Hep B C IgM NON REACTIVE  NON REACTIVE  GLUCOSE, CAPILLARY      Result Value Range   Glucose-Capillary 79  70 - 99 mg/dL  GLUCOSE, CAPILLARY      Result Value Range   Glucose-Capillary 71  70 - 99 mg/dL  BASIC METABOLIC PANEL      Result Value Range   Sodium 138  137 - 147 mEq/L   Potassium 3.4 (*) 3.7 - 5.3 mEq/L   Chloride 107  96 - 112 mEq/L   CO2 16 (*) 19 - 32 mEq/L   Glucose, Bld 88  70 - 99 mg/dL   BUN 13  6 - 23 mg/dL   Creatinine, Ser 0.96  0.50 - 1.10 mg/dL   Calcium 7.9 (*) 8.4 - 10.5 mg/dL   GFR calc non Af Amer 49 (*) >90 mL/min   GFR calc Af Amer 57 (*) >90 mL/min  BASIC METABOLIC PANEL      Result Value Range   Sodium 137  137 - 147 mEq/L   Potassium 3.1 (*) 3.7 - 5.3 mEq/L   Chloride 103  96 - 112 mEq/L   CO2 22  19 - 32 mEq/L   Glucose, Bld 98  70 - 99 mg/dL   BUN 13  6 - 23 mg/dL   Creatinine, Ser 0.45  0.50 - 1.10 mg/dL   Calcium 7.8 (*) 8.4 - 10.5 mg/dL   GFR calc non Af Amer 44 (*) >90 mL/min   GFR calc Af Amer 51 (*) >90 mL/min  BASIC METABOLIC PANEL      Result Value Range   Sodium 134 (*) 137 - 147 mEq/L   Potassium 3.4 (*) 3.7 - 5.3 mEq/L   Chloride 103  96 - 112 mEq/L   CO2 19  19 - 32 mEq/L   Glucose, Bld 90  70 - 99 mg/dL   BUN 11  6 - 23 mg/dL   Creatinine, Ser 4.09  0.50 - 1.10 mg/dL   Calcium 7.6 (*)  8.4 - 10.5 mg/dL   GFR calc non Af Amer 48 (*) >90 mL/min   GFR calc Af Amer 56 (*) >90 mL/min  BASIC METABOLIC PANEL      Result Value Range   Sodium 131 (*) 137 - 147 mEq/L   Potassium 3.6 (*) 3.7 - 5.3 mEq/L   Chloride 102  96 - 112 mEq/L   CO2 15 (*) 19 - 32 mEq/L   Glucose, Bld 86  70 - 99 mg/dL   BUN 10  6 - 23 mg/dL   Creatinine, Ser 1.61  0.50 - 1.10 mg/dL   Calcium 7.5 (*) 8.4 - 10.5 mg/dL   GFR  calc non Af Amer 53 (*) >90 mL/min   GFR calc Af Amer 61 (*) >90 mL/min  BASIC METABOLIC PANEL      Result Value Range   Sodium 135 (*) 137 - 147 mEq/L   Potassium 4.1  3.7 - 5.3 mEq/L   Chloride 105  96 - 112 mEq/L   CO2 19  19 - 32 mEq/L   Glucose, Bld 80  70 - 99 mg/dL   BUN 8  6 - 23 mg/dL   Creatinine, Ser 0.96  0.50 - 1.10 mg/dL   Calcium 7.4 (*) 8.4 - 10.5 mg/dL   GFR calc non Af Amer 52 (*) >90 mL/min   GFR calc Af Amer 60 (*) >90 mL/min  GLUCOSE, CAPILLARY      Result Value Range   Glucose-Capillary 81  70 - 99 mg/dL  CG4 I-STAT (LACTIC ACID)      Result Value Range   Lactic Acid, Venous 2.10  0.5 - 2.2 mmol/L   Micro Results: Recent Results (from the past 240 hour(s))  URINE CULTURE     Status: None   Collection Time    05/15/13  2:42 PM      Result Value Range Status   Specimen Description URINE, CATHETERIZED   Final   Special Requests NONE   Final   Culture  Setup Time     Final   Value: 05/15/2013 22:25     Performed at [facility] Count     Final   Value: >=100,000 COLONIES/ML     Performed at [facility]   Culture     Final   Value: ESCHERICHIA COLI     Performed at [facility]   Report Status 05/16/2013 FINAL   Final   Organism ID, Bacteria ESCHERICHIA COLI   Final  MRSA PCR SCREENING     Status: None   Collection Time    05/16/13 12:38 AM      Result Value Range Status   MRSA by PCR NEGATIVE  NEGATIVE Final   Comment:            The GeneXpert MRSA Assay (FDA     approved for NASAL specimens     only), is one component of a     comprehensive MRSA colonization     surveillance program. It is not     intended to diagnose MRSA     infection nor to guide or     monitor treatment for     MRSA infections.  CLOSTRIDIUM DIFFICILE BY PCR     Status: None   Collection Time    05/17/13  3:28 AM      Result Value Range Status   C difficile by pcr NEGATIVE  NEGATIVE Final   Studies/Results: No results  found. Medications:  I have reviewed the patient's current medications. Scheduled Meds: . aspirin EC  81 mg Oral q morning - 10a  . atenolol  12.5 mg Oral Daily  . cephALEXin  500 mg Oral Q12H  . clopidogrel  75 mg Oral Q breakfast  . feeding supplement (RESOURCE BREEZE)  1 Container Oral Q24H  . heparin  5,000 Units Subcutaneous Q8H  . irbesartan  75 mg Oral Daily  . pantoprazole  40 mg Oral Daily  . potassium chloride  30 mEq Oral BID  . sertraline  100 mg Oral q morning - 10a  . sucralfate  1 g Oral BID AC   Continuous Infusions: . dextrose 5 % and 0.45% NaCl Stopped (05/20/13 0917)   PRN Meds:. Assessment/Plan: Principal Problem:   Acute renal failure Active Problems:   CAD (coronary artery disease)   History of CVA (cerebrovascular accident)   HTN (hypertension), benign   Severe aortic stenosis   GERD (gastroesophageal reflux disease)   Physical deconditioning   Nausea & vomiting   Chronic diastolic CHF (congestive heart failure)   Protein-calorie malnutrition, severe   Metabolic acidosis   UTI (urinary tract infection)   LOS: 5 days   #Chronic n/v/d - improving. Currently followed by GI - had dilation of benign appearing stricture on 1/29.  -zofran q1 PRN  -protonix 20mg  BID  -mechanical soft diet  -f/u stool pathogen test  #Metabolic acidosis - Bicarb hs decreased to 17, differential diagnosis includes diarrhea, ketoacidosis from starvation, dilutional, RTA.  -monitor stool output -consider ABG if worsening acidosis  -d/c'ed IVFs  #UTI. U/A with +nitrite, large LE, and many bacteria. Culture grew out pansensitive E coli.  -s/p keflex 500mg  TID   #AKI - resolved. Patient's creatinine returned to baseline of 1.0  #hypokalemia - K+ 4.1 with normal Mg/Phos. ?RTA with low bicarb and relapsing hypokalemia  -replete as needed -patient will need follow up with nephrologist after discharge   #chronic diastolic CHF. No evidence of pulmonary edema on exam or CXR.  Patient appears euvolemic.  - holding home lasix  #HTN - BP 135/49. Not orthostatic. Pulse in the 50's-60's -atenolol 12.5 qd -irbesartan 75mg  daily  -hold home amlodipine. furosemide, hydralazine for now  #CAD. Troponins <0.3 on admission. EKG shows sinus rhythm and RBBB, unchanged from prior. No ST abnormalities.  -continue home Lipitor 10mg  qd   #Depression - mood seems depressed, however unsure of baseline. Patient states she is ready to die.  -continue home zoloft  #Dispo- Disposition is deferred at this time, awaiting improvement of current medical problems. Anticipated discharge in approximately 1-2 day(s).    This is a Psychologist, occupational Note.  The care of the patient was discussed with Dr. Delane Ginger and the assessment and plan formulated with their assistance.  Please see their attached note for official documentation of the daily encounter.  Kerby Nora M 05/20/2013, 9:38 AM

## 2013-05-20 NOTE — Progress Notes (Signed)
Subjective:   Pt reports feeling better this AM.  She is having a BM every time she eats.    Objective:   Vital signs in last 24 hours: Filed Vitals:   05/20/13 0555 05/20/13 0926 05/20/13 1256 05/20/13 1624  BP: 122/57 135/49 137/65 128/38  Pulse: 57 60 61 58  Temp: 97.6 F (36.4 C)  97.6 F (36.4 C)   TempSrc: Oral  Oral   Resp: 17 18 18 16   Height:      Weight: 122 lb 6.4 oz (55.52 kg)     SpO2: 98% 100% 100% 99%   Weight change: 2 lb 9.6 oz (1.179 kg)  Intake/Output Summary (Last 24 hours) at 05/20/13 2033 Last data filed at 05/20/13 1256  Gross per 24 hour  Intake 1128.75 ml  Output      0 ml  Net 1128.75 ml    Physical Exam: Constitutional: Vital signs reviewed.  Patient is resting comfortably in bed in no acute distress and cooperative with exam.   Head: Normocephalic and atraumatic Eyes: EOMI, pale conjunctivae, no scleral icterus  Neck: Supple, Trachea midline Cardiovascular: RRR, S1, S2 present, no MRG, DP 2+ b/l Pulmonary/Chest: normal respiratory effort, CTAB, no wheezes, rales, or rhonchi Abdominal: Soft. +BS, non-tender, non-distended Neurological: A&O x3, cranial nerve II-XII are grossly intact, moving all extremities  Skin: Warm, dry and intact. No rash  Lab Results:  BMP:  Recent Labs Lab 05/15/13 1854  05/17/13 0325  05/19/13 1548 05/20/13 0547  NA  --   < > 137  < > 131* 135*  K  --   < > 3.9  < > 3.6* 4.1  CL  --   < > 103  < > 102 105  CO2  --   < > 17*  < > 15* 19  GLUCOSE  --   < > 77  < > 86 80  BUN  --   < > 20  < > 10 8  CREATININE  --   < > 1.14*  < > 0.94 0.96  CALCIUM  --   < > 8.1*  < > 7.5* 7.4*  MG 1.6  --  1.6  --   --   --   PHOS 3.1  --  2.3  --   --   --   < > = values in this interval not displayed.  CBC:  Recent Labs Lab 05/15/13 1140  WBC 6.1  NEUTROABS 4.7  HGB 13.2  HCT 38.1  MCV 87.4  PLT 155    Coagulation: No results found for this basename: LABPROT, INR,  in the last 168  hours  CBG:            Recent Labs Lab 05/16/13 0621 05/16/13 0648 05/17/13 0618 05/18/13 0654 05/20/13 0559  GLUCAP 62* 72 79 71 81           HA1C:      No results found for this basename: HGBA1C,  in the last 168 hours  Lipid Panel: No results found for this basename: CHOL, HDL, LDLCALC, TRIG, CHOLHDL, LDLDIRECT,  in the last 168 hours  LFTs:  Recent Labs Lab 05/15/13 1140 05/17/13 0325  AST 33 35  ALT 22 20  ALKPHOS 68 67  BILITOT 1.1 0.7  PROT 5.2* 4.8*  ALBUMIN 3.0* 2.6*    Pancreatic Enzymes:  Recent Labs Lab 05/15/13 1140  LIPASE 29    Ammonia: No results found for this basename: AMMONIA,  in the last 168 hours  Cardiac Enzymes:  Recent Labs Lab 05/15/13 1140  TROPONINI <0.30   Lab Results  Component Value Date   CKTOTAL 52 01/25/2010   CKMB 0.6 01/25/2010   TROPONINI <0.30 05/15/2013    BNP: No results found for this basename: PROBNP,  in the last 168 hours  D-Dimer: No results found for this basename: DDIMER,  in the last 168 hours  Urinalysis:  Recent Labs Lab 05/15/13 1442  COLORURINE YELLOW  LABSPEC 1.020  PHURINE 5.5  GLUCOSEU NEGATIVE  HGBUR NEGATIVE  BILIRUBINUR MODERATE*  KETONESUR 15*  PROTEINUR NEGATIVE  UROBILINOGEN 1.0  NITRITE POSITIVE*  LEUKOCYTESUR LARGE*    Micro Results: Recent Results (from the past 240 hour(s))  URINE CULTURE     Status: None   Collection Time    05/15/13  2:42 PM      Result Value Range Status   Specimen Description URINE, CATHETERIZED   Final   Special Requests NONE   Final   Culture  Setup Time     Final   Value: 05/15/2013 22:25     [facility]   Colony Count     Final   Value: >=100,000 COLONIES/ML     [facility]formed [facility]   Culture     Final   Value: ESCHERICHIA COLI     [facility] at [facility]   Report Status 05/16/2013 FINAL   Final   Organism ID, Bacteria ESCHERICHIA COLI   Final  MRSA PCR SCREENING     Status: None    Collection Time    05/16/13 12:38 AM      Result Value Range Status   MRSA by PCR NEGATIVE  NEGATIVE Final   Comment:            The GeneXpert MRSA Assay (FDA     approved for NASAL specimens     only), is one component of a     comprehensive MRSA colonization     surveillance program. It is not     intended to diagnose MRSA     infection nor to guide or     monitor treatment for     MRSA infections.  CLOSTRIDIUM DIFFICILE BY PCR     Status: None   Collection Time    05/17/13  3:28 AM      Result Value Range Status   C difficile by pcr NEGATIVE  NEGATIVE Final    Blood Culture:    Component Value Date/Time   SDES URINE, CATHETERIZED 05/15/2013 1442   SPECREQUEST NONE 05/15/2013 1442   CULT  Value: ESCHERICHIA COLI [facility] 05/15/2013 1442   REPTSTATUS 05/16/2013 FINAL 05/15/2013 1442    Studies/Results: No results found.  Medications:  Scheduled Meds:  Continuous Infusions:  PRN Meds:.  Antibiotics: Anti-infectives   Start     Dose/Rate Route Frequency Ordered Stop   05/19/13 2200  cephALEXin (KEFLEX) capsule 500 mg  Status:  Discontinued     500 mg Oral Every 12 hours 05/19/13 1014 05/20/13 2021   05/17/13 1400  cephALEXin (KEFLEX) capsule 500 mg  Status:  Discontinued     500 mg Oral 3 times per day 05/17/13 1010 05/19/13 1014   05/15/13 1530  cefTRIAXone (ROCEPHIN) 1 g in dextrose 5 % 50 mL IVPB     1 g 100 mL/hr over 30 Minutes Intravenous  Once 05/15/13 1518 05/15/13 1856     Antibiotics Given (last 72  hours)   Date/Time Action Medication Dose   05/18/13 0859 Given   cephALEXin (KEFLEX) capsule 500 mg 500 mg   05/18/13 1241 Given   cephALEXin (KEFLEX) capsule 500 mg 500 mg   05/18/13 1829 Given  [not available from pharmacy at scheduled time]   cephALEXin (KEFLEX) capsule 500 mg 500 mg   05/19/13 2257 Given   cephALEXin (KEFLEX) capsule 500 mg 500 mg   05/20/13 4098 Given   cephALEXin (KEFLEX) capsule 500 mg 500 mg      Day of  Hospitalization:  5 Consults:    Assessment/Plan:   Principal Problem:   Acute renal failure Active Problems:   CAD (coronary artery disease)   History of CVA (cerebrovascular accident)   HTN (hypertension), benign   Severe aortic stenosis   GERD (gastroesophageal reflux disease)   Physical deconditioning   Nausea & vomiting   Chronic diastolic CHF (congestive heart failure)   Protein-calorie malnutrition, severe   Metabolic acidosis   UTI (urinary tract infection)  #Chronic nausea/vomiting/diarrhea - Pt reports feeling better this AM.  She reports having diarrhea every time she eats.  -d/c D5 1/2 NS @75ml /hr.   -continue mechanical soft diet  -continue PPI (protonix); pt on prilosec bid at home  -strict documentation of any episodes of vomiting/diarrhea  -appreciate nutrition consult recs -d/c to SNF today  #Acute on CKD3 - Resolved. Creatinine is trended down.  Mg/phos wnl.  -d/c IVF -strict I/Os  -avoid nephrotoxic meds    Lab Results  Component Value Date   CREATININE 0.96 05/20/2013   #Metabolic acidosis - Resolved.  Pt bicarb has declined most likely d/t diarrhea.    -continue to monitor at SNF -add lomotil  #UTI - Pt denies urinary symptoms.  UA revealed leukocytes and nitrites.  Culture>100,000 E.coli.  -keflex 500mg  q12h x 5 days   #HTN - Pt orthostasis resolved and denies dizziness.  Goal BP is more liberal <150/90. -d/c IVF -d/c atorvastatin which may exacerbate dyspepsia -decrease atenolol to 12.5mg  daily d/t bradycardia -irbesartan 75mg  daily   #Chronic diastolic HF - Pt does not appear volume overloaded on exam. -daily weights  -strict I/Os  #Protein Calorie Malnutrition, Severe -  -appreciate nutrition recs -continue nutritional shake supplements to diet  #FEN- d/c IVF  Electrolytes-Replete as needed Diet-Dysphagia 3 per GI recommendations   #Dispo- Anticipated discharge today pending SNF placement.  FL2 signed; discharge summary  completed.    LOS: 5 days   Boykin Peek, MD PGY-1, Internal Medicine  [phone] (7AM-5PM Mon-Fri) 05/20/2013, 8:33 PM

## 2013-05-20 NOTE — Progress Notes (Signed)
PT Cancellation Note  Patient Details Name: Kaylee Schmitt MRN: [id number] DOB: 06/21/1923   Cancelled Treatment:    Reason Eval/Treat Not Completed: Fatigue/lethargy limiting ability to participate. Pt reports too tired to participate and that she hasn't been left alone since she woke up. I and RN attempted to motivate pt for participation but she denied.    Kaylee Schmitt, Kaylee Schmitt 05/20/2013, 11:07 AM Kaylee MeigsMaija Schmitt Kaylee Schmitt, PT [phone]

## 2013-05-21 ENCOUNTER — Other Ambulatory Visit: Payer: Self-pay

## 2013-05-21 ENCOUNTER — Non-Acute Institutional Stay (SKILLED_NURSING_FACILITY): Payer: Medicare Other | Admitting: Adult Health

## 2013-05-21 ENCOUNTER — Encounter: Payer: Self-pay | Admitting: Adult Health

## 2013-05-21 DIAGNOSIS — R197 Diarrhea, unspecified: Secondary | ICD-10-CM

## 2013-05-21 DIAGNOSIS — Z8673 Personal history of transient ischemic attack (TIA), and cerebral infarction without residual deficits: Secondary | ICD-10-CM

## 2013-05-21 DIAGNOSIS — R5381 Other malaise: Secondary | ICD-10-CM

## 2013-05-21 DIAGNOSIS — I509 Heart failure, unspecified: Secondary | ICD-10-CM

## 2013-05-21 DIAGNOSIS — E785 Hyperlipidemia, unspecified: Secondary | ICD-10-CM

## 2013-05-21 DIAGNOSIS — K219 Gastro-esophageal reflux disease without esophagitis: Secondary | ICD-10-CM

## 2013-05-21 DIAGNOSIS — I10 Essential (primary) hypertension: Secondary | ICD-10-CM

## 2013-05-21 DIAGNOSIS — E876 Hypokalemia: Secondary | ICD-10-CM

## 2013-05-21 DIAGNOSIS — I5032 Chronic diastolic (congestive) heart failure: Secondary | ICD-10-CM

## 2013-05-21 DIAGNOSIS — F32A Depression, unspecified: Secondary | ICD-10-CM

## 2013-05-21 DIAGNOSIS — F329 Major depressive disorder, single episode, unspecified: Secondary | ICD-10-CM

## 2013-05-21 DIAGNOSIS — F3289 Other specified depressive episodes: Secondary | ICD-10-CM

## 2013-05-21 LAB — FECAL LACTOFERRIN, QUANT: Fecal Lactoferrin: POSITIVE

## 2013-05-21 MED ORDER — DIPHENOXYLATE-ATROPINE 2.5-0.025 MG PO TABS: 1.0000 | ORAL_TABLET | Freq: Four times a day (QID) | ORAL | Status: AC | PRN

## 2013-05-21 MED ORDER — POTASSIUM CHLORIDE CRYS ER 20 MEQ PO TBCR: 20.0000 meq | EXTENDED_RELEASE_TABLET | Freq: Every day | ORAL | Status: AC

## 2013-05-21 NOTE — Telephone Encounter (Signed)
Rx faxed to [facility] @ [phone], phone number [phone]  

## 2013-05-21 NOTE — Progress Notes (Signed)
Patient ID: Kaylee Schmitt, female   DOB: 03/16/1924, 80 y.o.   MRN: [id number]     [facility]  Allergies  Allergen Reactions  . Ramipril Cough  . Adhesive [Tape] Rash    Paper tape OK.  Marland Kitchen. Penicillins Itching and Rash  . Sulfa Antibiotics Itching and Rash     Chief Complaint  Patient presents with  . Hospitalization Follow-up    HPI:  She has been hospitalized for increased weakness; diarrhea; and abdominal pain. She is here for short term rehab. Her goal at this time is to return back to her assisted living. There are no concerns being voiced by the nursing staff at this time.    Past Medical History  Diagnosis Date  . Aortic stenosis   . CAD (coronary artery disease) 10/12/2012    Cath 2011  Calcified LM,  80% mid LAD, dominant circ without sig disease, Ostial nondominant RCA.  Severe AS  Liberte stent x 2 2011 Dr. Amil AmenEdmunds    . Gout     "not for years" (05/15/2013)  . Hypertensive heart disease 10/12/2012  . Hypertension   . Chronic kidney disease   . S/P TAVR (transcatheter aortic valve replacement) 11/27/2012    Stephannie PetersEdwards Sapien XT transcatheter heart valve (size 23mm) placed via open left transfemoral approach  . Hyperlipidemia 10/12/2012    intolerant to statins  . Carotid artery occlusion     moderate bilaterally  . Chronic diastolic CHF (congestive heart failure)   . Macular degeneration, left eye   . Myocardial infarction ~ 2013    "once"  . Pneumonia     "several times" (05/15/2013)  . Chronic bronchitis     "get it ~ q yr" (05/15/2013)  . Exertional shortness of breath   . History of blood transfusion     "related to ruptured appendix" (05/15/2013)  . GERD (gastroesophageal reflux disease)   . CVA (cerebral vascular accident) 2003    while living in [place]   Right brain with left hemiparesis     Past Surgical History  Procedure Laterality Date  . Cholecystectomy    . Appendectomy    . Cataract extraction w/ intraocular lens  implant, bilateral Bilateral   .  Transcatheter aortic valve replacement, transfemoral N/A 11/27/2012    Procedure: TRANSCATHETER AORTIC VALVE REPLACEMENT, TRANSFEMORAL;  Surgeon: Tonny BollmanMichael Cooper, MD;  [facility];  Service: Cardiovascular;  Laterality: N/A;  . Intraoperative transesophageal echocardiogram N/A 11/27/2012    Procedure: INTRAOPERATIVE TRANSESOPHAGEAL ECHOCARDIOGRAM;  Surgeon: Tonny BollmanMichael Cooper, MD;  [facility];  Service: Cardiovascular;  Laterality: N/A;  . Cardiac valve replacement    . Tonsillectomy    . Cardiac catheterization    . Coronary angioplasty with stent placement      "I've had 2" (05/15/2013)    VITAL SIGNS BP 115/69  Pulse 75  Ht 5' (1.524 m)  Wt 114 lb (51.71 kg)  BMI 22.26 kg/m2   Patient's Medications  New Prescriptions   No medications on file  Previous Medications   ASPIRIN EC 81 MG TABLET    Take 81 mg by mouth every morning.   ATENOLOL (TENORMIN) 12.5 MG TABS TABLET    Take 1 tablet (25 mg total) by mouth daily.   ATORVASTATIN (LIPITOR) 10 MG TABLET    Take 10 mg by mouth daily.   CLOPIDOGREL (PLAVIX) 75 MG TABLET    Take 1 tablet (75 mg total) by mouth daily with breakfast.   DIPHENOXYLATE-ATROPINE (LOMOTIL) 2.5-0.025 MG PER TABLET  Take 1 tablet by mouth 4 (four) times daily as needed for diarrhea or loose stools.   LOSARTAN (COZAAR) 25 MG TABLET    Take 25 mg by mouth daily.   OMEPRAZOLE (PRILOSEC) 20 MG CAPSULE    Take 20 mg by mouth 2 (two) times daily before a meal.    PHENADOZ 25 MG SUPPOSITORY    Place 25 mg rectally every 6 (six) hours as needed.   POTASSIUM CHLORIDE SA (K-DUR,KLOR-CON) 20 MEQ TABLET    Take 40 mEq by mouth daily.   SERTRALINE (ZOLOFT) 100 MG TABLET    Take 100 mg by mouth every morning.    SUCRALFATE (CARAFATE) 1 GM/10ML SUSPENSION    Take 1 g by mouth 4 (four) times daily -  with meals and at bedtime.  Modified Medications   No medications on file  Discontinued Medications   IRBESARTAN (AVAPRO) 75 MG TABLET    Take 1 tablet (75 mg total) by mouth  daily.    SIGNIFICANT DIAGNOSTIC EXAMS  04-22-13: UGI with kub: 1. Limited examination due to patient condition, as discussed above. 2. Although a discrete stricture is not identified, a 13 mm bariumpill would not pass beyond the gastroesophageal junction.   05-15-13: chest x-ray: No active cardiopulmonary disease.  05-15-13: ct of abdomen and pelvis: No acute findings to explain the patient's given symptoms. The appearance of slight wall thickening involving the transverse colon is likely due to underdistention.    LABS REVIEWED:   05-15-13: wbc 6.4; hgb 13.2; hct 38.1; mcv 87.4 plt 155; glucose 124; bun 28; creat 1.55; k+3.0; na++141 Liver normal albumin 3.0; lipase 29; phos 3.1; mag 1.9 urine culture: e-coli 05-16-13: glucose 68; bun 26; creat 1.35; k+4.2; na++138; tsh 4.503; free t4: 0.99 05-17-13: hepatis panel: neg; hiv: nr 05-20-13: glucose 80; bun 8; creat 0.96; k+4.1; na++ 135     Review of Systems  Constitutional: Negative for malaise/fatigue.  Respiratory: Negative for cough and shortness of breath.   Cardiovascular: Negative for chest pain, palpitations and leg swelling.  Gastrointestinal: Negative for heartburn, abdominal pain, diarrhea and constipation.       Has not had any further diarrhea since discharge from hospital   Musculoskeletal: Negative for joint pain and myalgias.  Skin: Negative.   Psychiatric/Behavioral: Negative for depression. The patient is not nervous/anxious and does not have insomnia.     Physical Exam  Constitutional: No distress.  thin  Neck: Neck supple. No JVD present. No thyromegaly present.  Cardiovascular: Normal rate, regular rhythm and intact distal pulses.   Respiratory: Effort normal and breath sounds normal. No respiratory distress. She has no wheezes.  GI: Soft. Bowel sounds are normal. She exhibits no distension. There is no tenderness.  Musculoskeletal: Normal range of motion. She exhibits no edema.  Neurological: She is alert.  Skin:  Skin is warm and dry. She is not diaphoretic.  Psychiatric: She has a normal mood and affect.      ASSESSMENT/ PLAN:  1. Chronic diastolic chf: she is presently stable is currently off lasix will place her on daily weight and to call for weight gain of 3 pounds in one day or 5 pounds in 7 days. She may require her lasix to be restarted.   2. Hypokalemia: will her k+ to 20 meq daily and will check bmp in one week.   3. GERD: is stable will continue prilosec 20 mg twice daily; carafate 1 gm four times daily; will continue lotomil four times daily as needed for  loose stools and will monitor her status  4. Hypertension; is stable will continue cozaar 25 mg daily and tenormin 25 mg daily and will monitor   5. Dyslipidemia: will continue lipitor 10 mg daily   6. Depression: she is stable will continue zoloft 100 mg daily   7. CVA: she is neurologically stable will continue asa 81 mg daily and plavix 75 mg daily and will monitor   8. Physical deconditioning: will continue therapy as directed to improve upon her gait; strength and level of independence with adl's. Will monitor    time spent with patient 50 minutes.

## 2013-05-23 ENCOUNTER — Non-Acute Institutional Stay (SKILLED_NURSING_FACILITY): Payer: Medicare Other | Admitting: Internal Medicine

## 2013-05-23 ENCOUNTER — Encounter: Payer: Self-pay | Admitting: Internal Medicine

## 2013-05-23 DIAGNOSIS — F3289 Other specified depressive episodes: Secondary | ICD-10-CM

## 2013-05-23 DIAGNOSIS — N179 Acute kidney failure, unspecified: Secondary | ICD-10-CM

## 2013-05-23 DIAGNOSIS — R5383 Other fatigue: Secondary | ICD-10-CM

## 2013-05-23 DIAGNOSIS — R112 Nausea with vomiting, unspecified: Secondary | ICD-10-CM

## 2013-05-23 DIAGNOSIS — E785 Hyperlipidemia, unspecified: Secondary | ICD-10-CM

## 2013-05-23 DIAGNOSIS — I5032 Chronic diastolic (congestive) heart failure: Secondary | ICD-10-CM

## 2013-05-23 DIAGNOSIS — F32A Depression, unspecified: Secondary | ICD-10-CM

## 2013-05-23 DIAGNOSIS — E876 Hypokalemia: Secondary | ICD-10-CM

## 2013-05-23 DIAGNOSIS — R531 Weakness: Secondary | ICD-10-CM

## 2013-05-23 DIAGNOSIS — I509 Heart failure, unspecified: Secondary | ICD-10-CM

## 2013-05-23 DIAGNOSIS — K219 Gastro-esophageal reflux disease without esophagitis: Secondary | ICD-10-CM

## 2013-05-23 DIAGNOSIS — R5381 Other malaise: Secondary | ICD-10-CM

## 2013-05-23 DIAGNOSIS — I10 Essential (primary) hypertension: Secondary | ICD-10-CM

## 2013-05-23 DIAGNOSIS — F329 Major depressive disorder, single episode, unspecified: Secondary | ICD-10-CM

## 2013-05-23 NOTE — Progress Notes (Signed)
Patient ID: Kaylee Schmitt, female   DOB: [birth year], 80 y.o.   MRN: [id number]    [facility]    PCP: Quintella Reichert, MD  Code Status: DNR  Allergies  Allergen Reactions  . Ramipril Cough  . Adhesive [Tape] Rash    Paper tape OK.  Marland Kitchen Penicillins Itching and Rash  . Sulfa Antibiotics Itching and Rash    Chief Complaint: new admit  HPI:  80 y/o female patient is here for STR after hospital admission with nausea, vomiting and generalized weakness. She has history of aortic stenosis , chf, ckd among others. Goal is for her to return to assisted living facility. She had one episode of vomiting yesterday after working with therapy. She has ocassional nausea but no further vomiting. She mentions feeling weak and tired and is in her bed. As per staff, she has poor po intake. She is drinking fluid. She is working with therapy team  Review of Systems:  Constitutional: positive for malaise/fatigue.  Respiratory: Negative for cough and shortness of breath.   Cardiovascular: Negative for chest pain, palpitations and leg swelling.  Gastrointestinal: Negative for heartburn, abdominal pain, diarrhea and constipation. No further loose stool Musculoskeletal: Negative for joint pain and myalgias.  Skin: Negative.   Psychiatric/Behavioral: Negative for depression. The patient is not nervous/anxious and does not have insomnia.     Past Medical History  Diagnosis Date  . Aortic stenosis   . CAD (coronary artery disease) 10/12/2012    Cath 2011  Calcified LM,  80% mid LAD, dominant circ without sig disease, Ostial nondominant RCA.  Severe AS  Liberte stent x 2 2011 Dr. Amil Amen    . Gout     "not for years" (05/15/2013)  . Hypertensive heart disease 10/12/2012  . Hypertension   . Chronic kidney disease   . S/P TAVR (transcatheter aortic valve replacement) 11/27/2012    Stephannie Peters XT transcatheter heart valve (size 23mm) placed via open left transfemoral approach  . Hyperlipidemia  10/12/2012    intolerant to statins  . Carotid artery occlusion     moderate bilaterally  . Chronic diastolic CHF (congestive heart failure)   . Macular degeneration, left eye   . Myocardial infarction ~ 2013    "once"  . Pneumonia     "several times" (05/15/2013)  . Chronic bronchitis     "get it ~ q yr" (05/15/2013)  . Exertional shortness of breath   . History of blood transfusion     "related to ruptured appendix" (05/15/2013)  . GERD (gastroesophageal reflux disease)   . CVA (cerebral vascular accident) 2003    while living in [place].   Right brain with left hemiparesis    Past Surgical History  Procedure Laterality Date  . Cholecystectomy    . Appendectomy    . Cataract extraction w/ intraocular lens  implant, bilateral Bilateral   . Transcatheter aortic valve replacement, transfemoral N/A 11/27/2012    Procedure: TRANSCATHETER AORTIC VALVE REPLACEMENT, TRANSFEMORAL;  Surgeon: Tonny Bollman, MD;  Location: [facility] OR;  Service: Cardiovascular;  Laterality: N/A;  . Intraoperative transesophageal echocardiogram N/A 11/27/2012    Procedure: INTRAOPERATIVE TRANSESOPHAGEAL ECHOCARDIOGRAM;  Surgeon: Tonny Bollman, MD;  Location: [facility] OR;  Service: Cardiovascular;  Laterality: N/A;  . Cardiac valve replacement    . Tonsillectomy    . Cardiac catheterization    . Coronary angioplasty with stent placement      "I've had 2" (05/15/2013)   Social History:   reports that she has  never smoked. She has never used smokeless tobacco. She reports that she drinks alcohol. She reports that she does not use illicit drugs.  Family History  Problem Relation Age of Onset  . Cancer - Prostate Father   . CAD Mother   . CVA Sister   . Brain cancer Sister   . COPD Sister     Medications: Patient's Medications  New Prescriptions   No medications on file  Previous Medications   ASPIRIN EC 81 MG TABLET    Take 81 mg by mouth every morning.   ATENOLOL (TENORMIN) 12.5 MG TABS TABLET    Take 1 tablet (25 mg  total) by mouth daily.   ATORVASTATIN (LIPITOR) 10 MG TABLET    Take 10 mg by mouth daily.   CLOPIDOGREL (PLAVIX) 75 MG TABLET    Take 1 tablet (75 mg total) by mouth daily with breakfast.   DIPHENOXYLATE-ATROPINE (LOMOTIL) 2.5-0.025 MG PER TABLET    Take 1 tablet by mouth 4 (four) times daily as needed for diarrhea or loose stools.   LOSARTAN (COZAAR) 25 MG TABLET    Take 25 mg by mouth daily.   OMEPRAZOLE (PRILOSEC) 20 MG CAPSULE    Take 20 mg by mouth 2 (two) times daily before a meal.    PHENADOZ 25 MG SUPPOSITORY    Place 25 mg rectally every 6 (six) hours as needed.   POTASSIUM CHLORIDE SA (K-DUR,KLOR-CON) 20 MEQ TABLET    Take 1 tablet (20 mEq total) by mouth daily.   SERTRALINE (ZOLOFT) 100 MG TABLET    Take 100 mg by mouth every morning.    SUCRALFATE (CARAFATE) 1 GM/10ML SUSPENSION    Take 1 g by mouth 4 (four) times daily -  with meals and at bedtime.  Modified Medications   No medications on file  Discontinued Medications   No medications on file     Physical Exam: Filed Vitals:   05/23/13 1421  BP: 140/56  Pulse: 60  Temp: 97 F (36.1 C)  Resp: 17  SpO2: 97%   Constitutional: thin, elderly female pt in no distress Neck: Neck supple. No JVD present.   Cardiovascular: Normal rate, regular rhythm and intact distal pulses.   Respiratory: Effort normal and breath sounds normal. No respiratory distress. She has no wheezes.  GI: Soft. Bowel sounds are normal. She exhibits no distension. There is no tenderness.  Musculoskeletal: Normal range of motion. She exhibits no edema. Weakness present Neurological: She is alert.  Skin: Skin is warm and dry. She is not diaphoretic.  Psychiatric: She has a normal mood and affect.   Labs reviewed: Basic Metabolic Panel:  Recent Labs  16/10/96 1700  05/15/13 1140 05/15/13 1854  05/17/13 0325  05/19/13 0422 05/19/13 1548 05/20/13 0547  NA  --   < > 141  --   < > 137  < > 134* 131* 135*  K  --   < > 3.0*  --   < > 3.9  < > 3.4*  3.6* 4.1  CL  --   < > 98  --   < > 103  < > 103 102 105  CO2  --   < > 26  --   < > 17*  < > 19 15* 19  GLUCOSE  --   < > 124*  --   < > 77  < > 90 86 80  BUN  --   < > 28*  --   < > 20  < >  11 10 8   CREATININE 1.01  < > 1.55*  --   < > 1.14*  < > 1.02 0.94 0.96  CALCIUM  --   < > 8.8  --   < > 8.1*  < > 7.6* 7.5* 7.4*  MG 1.6  --   --  1.6  --  1.6  --   --   --   --   PHOS  --   --   --  3.1  --  2.3  --   --   --   --   < > = values in this interval not displayed. Liver Function Tests:  Recent Labs  11/23/12 1424 05/15/13 1140 05/17/13 0325  AST 14 33 35  ALT 6 22 20   ALKPHOS 72 68 67  BILITOT 0.6 1.1 0.7  PROT 5.9* 5.2* 4.8*  ALBUMIN 3.6 3.0* 2.6*    Recent Labs  05/15/13 1140  LIPASE 29   No results found for this basename: AMMONIA,  in the last 8760 hours CBC:  Recent Labs  11/29/12 0420 04/02/13 1357 05/15/13 1140  WBC 11.0* 10.3 6.1  NEUTROABS  --  8.5* 4.7  HGB 11.9* 11.5* 13.2  HCT 34.7* 33.6* 38.1  MCV 85.9 85.8 87.4  PLT 108* 182.0 155   Cardiac Enzymes:  Recent Labs  10/12/12 1925 10/13/12 0040 05/15/13 1140  TROPONINI <0.30 <0.30 <0.30   BNP: No components found with this basename: POCBNP,  CBG:  Recent Labs  05/17/13 0618 05/18/13 0654 05/20/13 0559  GLUCAP 79 71 81     Assessment/Plan  ASSESSMENT/ PLAN:  Generalized weakness- in setting of her deconditioning and co-morbidities. To work with PT and OT for muscle strengthening and gait training exercise. To encourage po intake. Continue skin care, fall precautions and dietary supplement  Acute on chronic renal failure- likely in setting of dehydration. Encourage fluid intake. Recheck bmp in 1 week  Hypertension- bp on lower side and with her weakness and poor renal function, will reduce cozaar to 12.5 mg daily for now. Continue tenormin 12.5 mg daily and aspirin  CHF- chronic issue. Monitor weight and for signs of hypervolemia. Continue tenormin, reduce dose of cozaar. Assess  for need for diuresis. Does not need it at present  Chronic nausea/ vomiting-  Lab Results  Component Value Date   HGBA1C 4.8 11/23/2012   No known hx of diabetes. Unclear about cause of her symptom. Ct abdomen has ruled out acute process, labs are within normal range. On PPI and sucralfate with dysphagia diet for now. Will give a trial of metoclopramide with concern for delayed gut emptying syndrome. Will have her on metoclopramide 5 mg 30 min prior to her big meals twice a day for now. Also add zofran 4 mg tid prn nausea  GERD- continue prilosec 20 mg twice daily with carafate 1 gm four times daily  Hyperlipidemia- continue lipitor 10 mg daily   Depression- continue zoloft 100 mg daily   Hypokalemia- continue kcl supplement  Family/ staff Communication: reviewed care plan with patient and nursing supervisor   Goals of care: to return to ALF   Labs/tests ordered: bmp in 1 week

## 2013-05-24 ENCOUNTER — Non-Acute Institutional Stay (SKILLED_NURSING_FACILITY): Payer: Medicare Other | Admitting: Adult Health

## 2013-05-24 DIAGNOSIS — R630 Anorexia: Secondary | ICD-10-CM

## 2013-05-24 DIAGNOSIS — E860 Dehydration: Secondary | ICD-10-CM

## 2013-05-27 LAB — BASIC METABOLIC PANEL
BUN: 13 mg/dL (ref 4–21)
Creatinine: 1 mg/dL (ref 0.5–1.1)
Glucose: 77 mg/dL
Potassium: 4.4 mmol/L (ref 3.4–5.3)
SODIUM: 133 mmol/L — AB (ref 137–147)

## 2013-05-27 NOTE — Progress Notes (Signed)
Patient ID: Kaylee Schmitt, female   DOB: 09-24-1923, 80 y.o.   MRN: [id number]     [facility]  Allergies  Allergen Reactions  . Ramipril Cough  . Adhesive [Tape] Rash    Paper tape OK.  Marland Kitchen Penicillins Itching and Rash  . Sulfa Antibiotics Itching and Rash     Chief Complaint  Patient presents with  . Acute Visit    staff concerns     HPI:  She is not eating or drinking at this time. The nursing staff is concerned about her hydration status. She is lethargic today and cannot fully participate in the hpi or ros. She does tell me that she is not feeling good and is not wanting to eat or drink. The staff is unable to obtain an iv access; will use clysis for her hydration over the weekend.   Past Medical History  Diagnosis Date  . Aortic stenosis   . CAD (coronary artery disease) 10/12/2012    Cath 2011  Calcified LM,  80% mid LAD, dominant circ without sig disease, Ostial nondominant RCA.  Severe AS  Liberte stent x 2 2011 Dr. Amil Amen    . Gout     "not for years" (05/15/2013)  . Hypertensive heart disease 10/12/2012  . Hypertension   . Chronic kidney disease   . S/P TAVR (transcatheter aortic valve replacement) 11/27/2012    Stephannie Peters XT transcatheter heart valve (size 23mm) placed via open left transfemoral approach  . Hyperlipidemia 10/12/2012    intolerant to statins  . Carotid artery occlusion     moderate bilaterally  . Chronic diastolic CHF (congestive heart failure)   . Macular degeneration, left eye   . Myocardial infarction ~ 2013    "once"  . Pneumonia     "several times" (05/15/2013)  . Chronic bronchitis     "get it ~ q yr" (05/15/2013)  . Exertional shortness of breath   . History of blood transfusion     "related to ruptured appendix" (05/15/2013)  . GERD (gastroesophageal reflux disease)   . CVA (cerebral vascular accident) 2003    while living in [place].   Right brain with left hemiparesis     Past Surgical History  Procedure Laterality Date  .  Cholecystectomy    . Appendectomy    . Cataract extraction w/ intraocular lens  implant, bilateral Bilateral   . Transcatheter aortic valve replacement, transfemoral N/A 11/27/2012    Procedure: TRANSCATHETER AORTIC VALVE REPLACEMENT, TRANSFEMORAL;  Surgeon: Tonny Bollman, MD;  Location: [facility] OR;  Service: Cardiovascular;  Laterality: N/A;  . Intraoperative transesophageal echocardiogram N/A 11/27/2012    Procedure: INTRAOPERATIVE TRANSESOPHAGEAL ECHOCARDIOGRAM;  Surgeon: Tonny Bollman, MD;  Location: [facility] OR;  Service: Cardiovascular;  Laterality: N/A;  . Cardiac valve replacement    . Tonsillectomy    . Cardiac catheterization    . Coronary angioplasty with stent placement      "I've had 2" (05/15/2013)    VITAL SIGNS BP 100/56  Pulse 68  Ht 5' (1.524 m)  Wt 114 lb (51.71 kg)  BMI 22.26 kg/m2   Patient's Medications  New Prescriptions   No medications on file  Previous Medications   ASPIRIN EC 81 MG TABLET    Take 81 mg by mouth every morning.   ATENOLOL (TENORMIN) 12.5 MG TABS TABLET    Take 1 tablet (25 mg total) by mouth daily.   ATORVASTATIN (LIPITOR) 10 MG TABLET    Take 10 mg by mouth daily.  CLOPIDOGREL (PLAVIX) 75 MG TABLET    Take 1 tablet (75 mg total) by mouth daily with breakfast.   DIPHENOXYLATE-ATROPINE (LOMOTIL) 2.5-0.025 MG PER TABLET    Take 1 tablet by mouth 4 (four) times daily as needed for diarrhea or loose stools.   LOSARTAN (COZAAR) 25 MG TABLET    Take 25 mg by mouth daily.   OMEPRAZOLE (PRILOSEC) 20 MG CAPSULE    Take 20 mg by mouth 2 (two) times daily before a meal.    PHENADOZ 25 MG SUPPOSITORY    Place 25 mg rectally every 6 (six) hours as needed.   POTASSIUM CHLORIDE SA (K-DUR,KLOR-CON) 20 MEQ TABLET    Take 1 tablet (20 mEq total) by mouth daily.   SERTRALINE (ZOLOFT) 100 MG TABLET    Take 100 mg by mouth every morning.    SUCRALFATE (CARAFATE) 1 GM/10ML SUSPENSION    Take 1 g by mouth 4 (four) times daily -  with meals and at bedtime.  Modified  Medications   No medications on file  Discontinued Medications   No medications on file    SIGNIFICANT DIAGNOSTIC EXAMS  04-22-13: UGI with kub: 1. Limited examination due to patient condition, as discussed above. 2. Although a discrete stricture is not identified, a 13 mm bariumpill would not pass beyond the gastroesophageal junction.   05-15-13: chest x-ray: No active cardiopulmonary disease.  05-15-13: ct of abdomen and pelvis: No acute findings to explain the patient's given symptoms. The appearance of slight wall thickening involving the transverse colon is likely due to underdistention.    LABS REVIEWED:   05-15-13: wbc 6.4; hgb 13.2; hct 38.1; mcv 87.4 plt 155; glucose 124; bun 28; creat 1.55; k+3.0; na++141 Liver normal albumin 3.0; lipase 29; phos 3.1; mag 1.9 urine culture: e-coli 05-16-13: glucose 68; bun 26; creat 1.35; k+4.2; na++138; tsh 4.503; free t4: 0.99 05-17-13: hepatis panel: neg; hiv: nr 05-20-13: glucose 80; bun 8; creat 0.96; k+4.1; na++ 135    Review of Systems  Unable to perform ROS  she is lethargic     Physical Exam  Constitutional: No distress.  thin  Neck: Neck supple. No JVD present. No thyromegaly present.  Cardiovascular: Normal rate, regular rhythm and intact distal pulses.   Respiratory: Effort normal and breath sounds normal. No respiratory distress. She has no wheezes.  GI: Soft. Bowel sounds are normal. She exhibits no distension. There is no tenderness.  Musculoskeletal: Normal range of motion. She exhibits no edema.  Neurological: She is alert.  Skin: Skin is warm and dry. She is not diaphoretic.  Psychiatric: She has a normal mood and affect.     ASSESSMENT/ PLAN:  1. Anorexia: unable to get bmp today; will begin D5 1/2 NA at 60 cc hour for 2 liters via clysis. Will check stat bmp on Monday; will continue to monitor her status.     Kaylee Innocenteborah Gradyn Shein NP [facility]  Contact [phone] Monday through Friday 8am- 5pm  After  hours call [phone]

## 2013-05-30 ENCOUNTER — Encounter: Payer: Self-pay | Admitting: Adult Health

## 2013-05-30 ENCOUNTER — Non-Acute Institutional Stay (SKILLED_NURSING_FACILITY): Payer: Medicare Other | Admitting: Adult Health

## 2013-05-30 DIAGNOSIS — R630 Anorexia: Secondary | ICD-10-CM

## 2013-05-30 NOTE — Progress Notes (Signed)
Patient ID: Kaylee Schmitt, female   DOB: 10/07/1923, 80 y.o.   MRN: [id number]     [facility]  Allergies  Allergen Reactions  . Ramipril Cough  . Adhesive [Tape] Rash    Paper tape OK.  Marland Kitchen. Penicillins Itching and Rash  . Sulfa Antibiotics Itching and Rash     Chief Complaint  Patient presents with  . Acute Visit    anorexia    HPI:  The nursing staff remains concerned that she is not eating. She did recently receive ivf; which have not helped to improve her overall status. She tells me that she doesn't want to eat and that food makes her sick. Overall she unable to fully participate in the hpi or ros as she is lethargic.   Past Medical History  Diagnosis Date  . Aortic stenosis   . CAD (coronary artery disease) 10/12/2012    Cath 2011  Calcified LM,  80% mid LAD, dominant circ without sig disease, Ostial nondominant RCA.  Severe AS  Liberte stent x 2 2011 Dr. Amil AmenEdmunds    . Gout     "not for years" (05/15/2013)  . Hypertensive heart disease 10/12/2012  . Hypertension   . Chronic kidney disease   . S/P TAVR (transcatheter aortic valve replacement) 11/27/2012    Stephannie PetersEdwards Sapien XT transcatheter heart valve (size 23mm) placed via open left transfemoral approach  . Hyperlipidemia 10/12/2012    intolerant to statins  . Carotid artery occlusion     moderate bilaterally  . Chronic diastolic CHF (congestive heart failure)   . Macular degeneration, left eye   . Myocardial infarction ~ 2013    "once"  . Pneumonia     "several times" (05/15/2013)  . Chronic bronchitis     "get it ~ q yr" (05/15/2013)  . Exertional shortness of breath   . History of blood transfusion     "related to ruptured appendix" (05/15/2013)  . GERD (gastroesophageal reflux disease)   . CVA (cerebral vascular accident) 2003    while living in [place]   Right brain with left hemiparesis     Past Surgical History  Procedure Laterality Date  . Cholecystectomy    . Appendectomy    . Cataract extraction w/ intraocular  lens  implant, bilateral Bilateral   . Transcatheter aortic valve replacement, transfemoral N/A 11/27/2012    Procedure: TRANSCATHETER AORTIC VALVE REPLACEMENT, TRANSFEMORAL;  Surgeon: Tonny BollmanMichael Cooper, MD;  [facility];  Service: Cardiovascular;  Laterality: N/A;  . Intraoperative transesophageal echocardiogram N/A 11/27/2012    Procedure: INTRAOPERATIVE TRANSESOPHAGEAL ECHOCARDIOGRAM;  Surgeon: Tonny BollmanMichael Cooper, MD;  [facility];  Service: Cardiovascular;  Laterality: N/A;  . Cardiac valve replacement    . Tonsillectomy    . Cardiac catheterization    . Coronary angioplasty with stent placement      "I've had 2" (05/15/2013)    VITAL SIGNS BP 128/91  Pulse 74  Ht 4\' 10"  (1.473 m)  Wt 123 lb 3.2 oz (55.883 kg)  BMI 25.76 kg/m2   Patient's Medications  New Prescriptions   No medications on file  Previous Medications   ASPIRIN EC 81 MG TABLET    Take 81 mg by mouth every morning.   ATENOLOL (TENORMIN) 12.5 MG TABS TABLET    Take 1 tablet (25 mg total) by mouth daily.   ATORVASTATIN (LIPITOR) 10 MG TABLET    Take 10 mg by mouth daily.   CLOPIDOGREL (PLAVIX) 75 MG TABLET    Take 1 tablet (75  mg total) by mouth daily with breakfast.   DIPHENOXYLATE-ATROPINE (LOMOTIL) 2.5-0.025 MG PER TABLET    Take 1 tablet by mouth 4 (four) times daily as needed for diarrhea or loose stools.   LOSARTAN (COZAAR) 25 MG TABLET    Take 12.5 mg by mouth daily.    METOCLOPRAMIDE (REGLAN) 5 MG TABLET    Take 5 mg by mouth 2 (two) times daily.   OMEPRAZOLE (PRILOSEC) 20 MG CAPSULE    Take 20 mg by mouth 2 (two) times daily before a meal.    ONDANSETRON (ZOFRAN) 4 MG TABLET    Take 4 mg by mouth every 8 (eight) hours as needed for nausea or vomiting.   PHENADOZ 25 MG SUPPOSITORY    Place 25 mg rectally every 6 (six) hours as needed.   POTASSIUM CHLORIDE SA (K-DUR,KLOR-CON) 20 MEQ TABLET    Take 1 tablet (20 mEq total) by mouth daily.   SERTRALINE (ZOLOFT) 100 MG TABLET    Take 100 mg by mouth every morning.      SUCRALFATE (CARAFATE) 1 GM/10ML SUSPENSION    Take 1 g by mouth 4 (four) times daily -  with meals and at bedtime.  Modified Medications   No medications on file  Discontinued Medications   No medications on file    SIGNIFICANT DIAGNOSTIC EXAMS  04-22-13: UGI with kub: 1. Limited examination due to patient condition, as discussed above. 2. Although a discrete stricture is not identified, a 13 mm bariumpill would not pass beyond the gastroesophageal junction.   05-15-13: chest x-ray: No active cardiopulmonary disease.  05-15-13: ct of abdomen and pelvis: No acute findings to explain the patient's given symptoms. The appearance of slight wall thickening involving the transverse colon is likely due to underdistention.    LABS REVIEWED:   05-15-13: wbc 6.4; hgb 13.2; hct 38.1; mcv 87.4 plt 155; glucose 124; bun 28; creat 1.55; k+3.0; na++141 Liver normal albumin 3.0; lipase 29; phos 3.1; mag 1.9 urine culture: e-coli 05-16-13: glucose 68; bun 26; creat 1.35; k+4.2; na++138; tsh 4.503; free t4: 0.99 05-17-13: hepatis panel: neg; hiv: nr 05-20-13: glucose 80; bun 8; creat 0.96; k+4.1; na++ 135 05-27-13: glucose 77; bun 13; creat 1.0; k+4.4; na++ 133    Review of Systems  Unable to perform ROS  she is lethargic     Physical Exam  Constitutional: No distress.  thin  Neck: Neck supple. No JVD present. No thyromegaly present.  Cardiovascular: Normal rate, regular rhythm and intact distal pulses.   Respiratory: Effort normal and breath sounds normal. No respiratory distress. She has no wheezes.  GI: Soft. Bowel sounds are normal. She exhibits no distension. There is no tenderness.  Musculoskeletal: Normal range of motion. She exhibits no edema.  Neurological: She is alert.  Skin: Skin is warm and dry. She is not diaphoretic.  Psychiatric: She has a normal mood and affect.     ASSESSMENT/ PLAN:  1. Anorexia: will begin her low dose remeron 7.5 mg nightly for 30 days; will begin her small  portions so she doesn't feel overwhelmed by her meals; will check bmp in the am and will monitor her status.     Synthia Innocent NP [facility]  Contact [phone] Monday through Friday 8am- 5pm  After hours call [phone]

## 2013-06-02 DIAGNOSIS — R630 Anorexia: Secondary | ICD-10-CM | POA: Insufficient documentation

## 2013-06-02 MED ORDER — MIRTAZAPINE 15 MG PO TABS: 7.5000 mg | ORAL_TABLET | Freq: Every day | ORAL | Status: AC

## 2013-06-12 ENCOUNTER — Non-Acute Institutional Stay (SKILLED_NURSING_FACILITY): Payer: Medicare Other | Admitting: Adult Health

## 2013-06-12 DIAGNOSIS — I2510 Atherosclerotic heart disease of native coronary artery without angina pectoris: Secondary | ICD-10-CM

## 2013-06-12 DIAGNOSIS — E876 Hypokalemia: Secondary | ICD-10-CM

## 2013-06-12 DIAGNOSIS — R630 Anorexia: Secondary | ICD-10-CM

## 2013-06-12 DIAGNOSIS — I10 Essential (primary) hypertension: Secondary | ICD-10-CM

## 2013-06-12 DIAGNOSIS — Z8673 Personal history of transient ischemic attack (TIA), and cerebral infarction without residual deficits: Secondary | ICD-10-CM

## 2013-06-12 DIAGNOSIS — R112 Nausea with vomiting, unspecified: Secondary | ICD-10-CM

## 2013-06-12 DIAGNOSIS — K219 Gastro-esophageal reflux disease without esophagitis: Secondary | ICD-10-CM

## 2013-06-12 DIAGNOSIS — I509 Heart failure, unspecified: Secondary | ICD-10-CM

## 2013-06-12 DIAGNOSIS — I5032 Chronic diastolic (congestive) heart failure: Secondary | ICD-10-CM

## 2013-06-17 ENCOUNTER — Ambulatory Visit: Payer: Medicare Other | Admitting: Cardiology

## 2013-06-19 ENCOUNTER — Encounter: Payer: Self-pay | Admitting: Adult Health

## 2013-06-19 LAB — CBC AND DIFFERENTIAL
HCT: 26 % — AB (ref 36–46)
Hemoglobin: 8.4 g/dL — AB (ref 12.0–16.0)
PLATELETS: 225 10*3/uL (ref 150–399)
WBC: 7.5 10*3/mL

## 2013-06-19 LAB — BASIC METABOLIC PANEL
BUN: 14 mg/dL (ref 4–21)
Creatinine: 0.9 mg/dL (ref 0.5–1.1)
GLUCOSE: 85 mg/dL
Potassium: 3.4 mmol/L (ref 3.4–5.3)
Sodium: 142 mmol/L (ref 137–147)

## 2013-06-19 MED ORDER — FUROSEMIDE 20 MG PO TABS: 20.0000 mg | ORAL_TABLET | Freq: Every day | ORAL | Status: AC

## 2013-06-19 NOTE — Progress Notes (Signed)
Patient ID: Kaylee Schmitt, female   DOB: [birth year], 80 y.o.   MRN: [id number]     [facility]  Allergies  Allergen Reactions  . Ramipril Cough  . Adhesive [Tape] Rash    Paper tape OK.  Marland Kitchen Penicillins Itching and Rash  . Sulfa Antibiotics Itching and Rash     Chief Complaint  Patient presents with  . Medical Managment of Chronic Issues    HPI:  She is being seen for the management of her chronic illnesses.  She is doing better. She is eating; no longer has nausea. She is involved with therapy; is out of bed during the day. Her goal remains to return back home. She was started on remeron 7.5 mg nightly for one month on 05-30-13. There are no concerns being voiced by the nursing staff at this time.    Past Medical History  Diagnosis Date  . Aortic stenosis   . CAD (coronary artery disease) 10/12/2012    Cath 2011  Calcified LM,  80% mid LAD, dominant circ without sig disease, Ostial nondominant RCA.  Severe AS  Liberte stent x 2 2011 Dr. Amil Amen    . Gout     "not for years" (05/15/2013)  . Hypertensive heart disease 10/12/2012  . Hypertension   . Chronic kidney disease   . S/P TAVR (transcatheter aortic valve replacement) 11/27/2012    Stephannie Peters XT transcatheter heart valve (size 23mm) placed via open left transfemoral approach  . Hyperlipidemia 10/12/2012    intolerant to statins  . Carotid artery occlusion     moderate bilaterally  . Chronic diastolic CHF (congestive heart failure)   . Macular degeneration, left eye   . Myocardial infarction ~ 2013    "once"  . Pneumonia     "several times" (05/15/2013)  . Chronic bronchitis     "get it ~ q yr" (05/15/2013)  . Exertional shortness of breath   . History of blood transfusion     "related to ruptured appendix" (05/15/2013)  . GERD (gastroesophageal reflux disease)   . CVA (cerebral vascular accident) 2003    while living in [place].   Right brain with left hemiparesis     Past Surgical History  Procedure Laterality Date  .  Cholecystectomy    . Appendectomy    . Cataract extraction w/ intraocular lens  implant, bilateral Bilateral   . Transcatheter aortic valve replacement, transfemoral N/A 11/27/2012    Procedure: TRANSCATHETER AORTIC VALVE REPLACEMENT, TRANSFEMORAL;  Surgeon: Tonny Bollman, MD;  Location: [facility] OR;  Service: Cardiovascular;  Laterality: N/A;  . Intraoperative transesophageal echocardiogram N/A 11/27/2012    Procedure: INTRAOPERATIVE TRANSESOPHAGEAL ECHOCARDIOGRAM;  Surgeon: Tonny Bollman, MD;  Location: [facility] OR;  Service: Cardiovascular;  Laterality: N/A;  . Cardiac valve replacement    . Tonsillectomy    . Cardiac catheterization    . Coronary angioplasty with stent placement      "I've had 2" (05/15/2013)    VITAL SIGNS BP 127/68  Pulse 66  Ht 4\' 10"  (1.473 m)  Wt 124 lb 3.2 oz (56.337 kg)  BMI 25.96 kg/m2   Patient's Medications  New Prescriptions   No medications on file  Previous Medications   ASPIRIN EC 81 MG TABLET    Take 81 mg by mouth every morning.   ATENOLOL (TENORMIN) 12.5 MG TABS TABLET    Take 1 tablet (25 mg total) by mouth daily.   ATORVASTATIN (LIPITOR) 10 MG TABLET    Take 10 mg by mouth daily.  CLOPIDOGREL (PLAVIX) 75 MG TABLET    Take 1 tablet (75 mg total) by mouth daily with breakfast.   DIPHENOXYLATE-ATROPINE (LOMOTIL) 2.5-0.025 MG PER TABLET    Take 1 tablet by mouth 4 (four) times daily as needed for diarrhea or loose stools.   LOSARTAN (COZAAR) 25 MG TABLET    Take 12.5 mg by mouth daily.    METOCLOPRAMIDE (REGLAN) 5 MG TABLET    Take 5 mg by mouth 2 (two) times daily.   MIRTAZAPINE (REMERON) 15 MG TABLET    Take 0.5 tablets (7.5 mg total) by mouth at bedtime.   OMEPRAZOLE (PRILOSEC) 20 MG CAPSULE    Take 20 mg by mouth 2 (two) times daily before a meal.    ONDANSETRON (ZOFRAN) 4 MG TABLET    Take 4 mg by mouth every 8 (eight) hours as needed for nausea or vomiting.   PHENADOZ 25 MG SUPPOSITORY    Place 25 mg rectally every 6 (six) hours as needed.   POTASSIUM  CHLORIDE SA (K-DUR,KLOR-CON) 20 MEQ TABLET    Take 1 tablet (20 mEq total) by mouth daily.   SERTRALINE (ZOLOFT) 100 MG TABLET    Take 100 mg by mouth every morning.    SUCRALFATE (CARAFATE) 1 GM/10ML SUSPENSION    Take 1 g by mouth 4 (four) times daily -  with meals and at bedtime.  Modified Medications   No medications on file  Discontinued Medications   No medications on file    SIGNIFICANT DIAGNOSTIC EXAMS  04-22-13: UGI with kub: 1. Limited examination due to patient condition, as discussed above. 2. Although a discrete stricture is not identified, a 13 mm bariumpill would not pass beyond the gastroesophageal junction.   05-15-13: chest x-ray: No active cardiopulmonary disease.  05-15-13: ct of abdomen and pelvis: No acute findings to explain the patient's given symptoms. The appearance of slight wall thickening involving the transverse colon is likely due to underdistention.    LABS REVIEWED:   05-15-13: wbc 6.4; hgb 13.2; hct 38.1; mcv 87.4 plt 155; glucose 124; bun 28; creat 1.55; k+3.0; na++141 Liver normal albumin 3.0; lipase 29; phos 3.1; mag 1.9 urine culture: e-coli 05-16-13: glucose 68; bun 26; creat 1.35; k+4.2; na++138; tsh 4.503; free t4: 0.99 05-17-13: hepatis panel: neg; hiv: nr 05-20-13: glucose 80; bun 8; creat 0.96; k+4.1; na++ 135 05-27-13: glucose 77; bun 13; creat 1.0; k+4.4; na++ 133 05-30-13: glucose 93; bun 10; creat 1.1; k+4.2; na++ 136       Review of Systems  Constitutional: Negative for malaise/fatigue.  Eyes: Negative for blurred vision.  Respiratory: Negative for cough and shortness of breath.   Cardiovascular: Positive for leg swelling. Negative for chest pain and palpitations.  Gastrointestinal: Negative for heartburn, abdominal pain and constipation.  Musculoskeletal: Negative for joint pain and myalgias.  Skin: Negative.   Neurological: Negative for weakness.  Psychiatric/Behavioral: Negative for depression. The patient is not nervous/anxious.        Physical Exam  Constitutional: No distress.  thin  Neck: Neck supple. No JVD present.  Cardiovascular: Normal rate, regular rhythm and intact distal pulses.   Respiratory: Effort normal and breath sounds normal. No respiratory distress. She has no wheezes.  GI: Soft. Bowel sounds are normal. She exhibits no distension. There is no tenderness.  Musculoskeletal: Normal range of motion. She exhibits edema.  Has 2+ lower extremity edema   Neurological: She is alert.  Skin: Skin is warm and dry. She is not diaphoretic.  ASSESSMENT/ PLAN:  1. chronic diastolic chf: she is worse; will begin lasix 20 mg daily; and will check bmp in 2 weeks. Will monitor her status  2.  Hypertension; is stable will continue cozaar 12.5 mg daily; tenormin 25 mg daily asa 81 mg daily and will monitor  3. CVA: is neurologically stable; will continue asa 81 mg daily and plavix 75 mg daily   4. Hypokalemia will continue k+ 2 meq daily   5. Gerd: is stable will continue prilosec 20 mg twice daily and carafate 1 gm four times daily  she is not having any further nausea; will stop her reglan at this time and will monitor her status. Will need to consider lowering or stopping her carafate.   6. Anorexia: she is improving; will continue her remeron 7.5 mg nightly for the total 30 days and will monitor her status. Her weight is stable at this time.   7. CAD: no complaint of chest pain present: will continue asa 81 mg daily and plavix 75 mg daily   8. Depression: she is doing well at this time; will continue zoloft 100 mg daily        Synthia Innocenteborah Green NP [facility]  Contact [phone] Monday through Friday 8am- 5pm  After hours call [phone]

## 2013-06-21 ENCOUNTER — Non-Acute Institutional Stay (SKILLED_NURSING_FACILITY): Payer: Medicare Other | Admitting: Adult Health

## 2013-06-21 DIAGNOSIS — E876 Hypokalemia: Secondary | ICD-10-CM

## 2013-06-21 DIAGNOSIS — J189 Pneumonia, unspecified organism: Secondary | ICD-10-CM

## 2013-06-21 DIAGNOSIS — I10 Essential (primary) hypertension: Secondary | ICD-10-CM

## 2013-06-24 ENCOUNTER — Encounter: Payer: Self-pay | Admitting: Adult Health

## 2013-06-24 ENCOUNTER — Non-Acute Institutional Stay (SKILLED_NURSING_FACILITY): Payer: Medicare Other | Admitting: Adult Health

## 2013-06-24 DIAGNOSIS — R0989 Other specified symptoms and signs involving the circulatory and respiratory systems: Secondary | ICD-10-CM

## 2013-06-24 DIAGNOSIS — J189 Pneumonia, unspecified organism: Secondary | ICD-10-CM | POA: Insufficient documentation

## 2013-06-24 DIAGNOSIS — R238 Other skin changes: Secondary | ICD-10-CM

## 2013-06-24 DIAGNOSIS — J811 Chronic pulmonary edema: Secondary | ICD-10-CM

## 2013-06-24 DIAGNOSIS — R233 Spontaneous ecchymoses: Secondary | ICD-10-CM

## 2013-06-24 MED ORDER — LOSARTAN POTASSIUM 25 MG PO TABS: 25.0000 mg | ORAL_TABLET | Freq: Every day | ORAL | Status: AC

## 2013-06-24 NOTE — Progress Notes (Signed)
Patient ID: Kaylee Schmitt, female   DOB: 10/06/1923, 80 y.o.   MRN: [id number]     [facility]  Allergies  Allergen Reactions  . Ramipril Cough  . Adhesive [Tape] Rash    Paper tape OK.  Marland Kitchen. Penicillins Itching and Rash  . Sulfa Antibiotics Itching and Rash     Chief Complaint  Patient presents with  . Acute Visit    change in status     HPI:  She is having increased difficulty breathing for the past couple of days; states she is coughing up yellow sputum. She is having increased bruising present she has on her right lower arm a skin tear on her bruised area. Her appetite is decreased. Her family is present at bedside.    Past Medical History  Diagnosis Date  . Aortic stenosis   . CAD (coronary artery disease) 10/12/2012    Cath 2011  Calcified LM,  80% mid LAD, dominant circ without sig disease, Ostial nondominant RCA.  Severe AS  Liberte stent x 2 2011 Dr. Amil AmenEdmunds    . Gout     "not for years" (05/15/2013)  . Hypertensive heart disease 10/12/2012  . Hypertension   . Chronic kidney disease   . S/P TAVR (transcatheter aortic valve replacement) 11/27/2012    Stephannie PetersEdwards Sapien XT transcatheter heart valve (size 23mm) placed via open left transfemoral approach  . Hyperlipidemia 10/12/2012    intolerant to statins  . Carotid artery occlusion     moderate bilaterally  . Chronic diastolic CHF (congestive heart failure)   . Macular degeneration, left eye   . Myocardial infarction ~ 2013    "once"  . Pneumonia     "several times" (05/15/2013)  . Chronic bronchitis     "get it ~ q yr" (05/15/2013)  . Exertional shortness of breath   . History of blood transfusion     "related to ruptured appendix" (05/15/2013)  . GERD (gastroesophageal reflux disease)   . CVA (cerebral vascular accident) 2003    while living in [place].   Right brain with left hemiparesis     Past Surgical History  Procedure Laterality Date  . Cholecystectomy    . Appendectomy    . Cataract extraction w/ intraocular lens   implant, bilateral Bilateral   . Transcatheter aortic valve replacement, transfemoral N/A 11/27/2012    Procedure: TRANSCATHETER AORTIC VALVE REPLACEMENT, TRANSFEMORAL;  Surgeon: Tonny BollmanMichael Cooper, MD;  [facility];  Service: Cardiovascular;  Laterality: N/A;  . Intraoperative transesophageal echocardiogram N/A 11/27/2012    Procedure: INTRAOPERATIVE TRANSESOPHAGEAL ECHOCARDIOGRAM;  Surgeon: Tonny BollmanMichael Cooper, MD;  [facility];  Service: Cardiovascular;  Laterality: N/A;  . Cardiac valve replacement    . Tonsillectomy    . Cardiac catheterization    . Coronary angioplasty with stent placement      "I've had 2" (05/15/2013)    VITAL SIGNS BP 129/88  Pulse 70  Temp(Src) 99.5 F (37.5 C) (Oral)  Ht 4\' 10"  (1.473 m)  Wt 121 lb 6.4 oz (55.067 kg)  BMI 25.38 kg/m2   Patient's Medications  New Prescriptions   No medications on file  Previous Medications   ASPIRIN EC 81 MG TABLET    Take 81 mg by mouth every morning.   ATENOLOL (TENORMIN) 12.5 MG TABS TABLET    Take 1 tablet (25 mg total) by mouth daily.   ATORVASTATIN (LIPITOR) 10 MG TABLET    Take 10 mg by mouth daily.   CLOPIDOGREL (PLAVIX) 75 MG TABLET  Take 1 tablet (75 mg total) by mouth daily with breakfast.   DIPHENOXYLATE-ATROPINE (LOMOTIL) 2.5-0.025 MG PER TABLET    Take 1 tablet by mouth 4 (four) times daily as needed for diarrhea or loose stools.   FUROSEMIDE (LASIX) 20 MG TABLET    Take 1 tablet (20 mg total) by mouth daily.   LOSARTAN (COZAAR) 25 MG TABLET    Take 1 tablet (25 mg total) by mouth daily.   MIRTAZAPINE (REMERON) 15 MG TABLET    Take 0.5 tablets (7.5 mg total) by mouth at bedtime.   OMEPRAZOLE (PRILOSEC) 20 MG CAPSULE    Take 20 mg by mouth 2 (two) times daily before a meal.    ONDANSETRON (ZOFRAN) 4 MG TABLET    Take 4 mg by mouth every 8 (eight) hours as needed for nausea or vomiting.   PHENADOZ 25 MG SUPPOSITORY    Place 25 mg rectally every 6 (six) hours as needed.   POTASSIUM CHLORIDE SA (K-DUR,KLOR-CON)  20 MEQ TABLET    Take 1 tablet (20 mEq total) by mouth daily.   SERTRALINE (ZOLOFT) 100 MG TABLET    Take 100 mg by mouth every morning.    SUCRALFATE (CARAFATE) 1 GM/10ML SUSPENSION    Take 1 g by mouth 4 (four) times daily -  with meals and at bedtime.  Modified Medications   No medications on file  Discontinued Medications   No medications on file    SIGNIFICANT DIAGNOSTIC EXAMS  04-22-13: UGI with kub: 1. Limited examination due to patient condition, as discussed above. 2. Although a discrete stricture is not identified, a 13 mm bariumpill would not pass beyond the gastroesophageal junction.   05-15-13: chest x-ray: No active cardiopulmonary disease.  05-15-13: ct of abdomen and pelvis: No acute findings to explain the patient's given symptoms. The appearance of slight wall thickening involving the transverse colon is likely due to underdistention.  06-19-13: chest x-ray; poor inspiration. Old granulamous disease. Mild cardiomegaly without change. Minimal pulmonary vascular congestion improved. No pleural effusion. Patchy pneumonitis or atelectasis at left lower lung.  06-24-13: chest x-ray; mild cardiomegaly with mild pulmonary vascular congestion. No pleural effusion. Patchy pneumonitis or edema involves right lung somewhat diffusely and left mid/lower lung to a lesser extent.      LABS REVIEWED:   05-15-13: wbc 6.4; hgb 13.2; hct 38.1; mcv 87.4 plt 155; glucose 124; bun 28; creat 1.55; k+3.0; na++141 Liver normal albumin 3.0; lipase 29; phos 3.1; mag 1.9 urine culture: e-coli 05-16-13: glucose 68; bun 26; creat 1.35; k+4.2; na++138; tsh 4.503; free t4: 0.99 05-17-13: hepatis panel: neg; hiv: nr 05-20-13: glucose 80; bun 8; creat 0.96; k+4.1; na++ 135 05-27-13: glucose 77; bun 13; creat 1.0; k+4.4; na++ 133 05-30-13: glucose 93; bun 10; creat 1.1; k+4.2; na++ 136  06-19-13: wbc 7.5;hgb 8.4; hct 25.9; mcv 95.9; plt 225; glucose 85; bun 14; creat 0.9; k+3.4; na++142       Review of Systems    Constitutional: Negative for malaise/fatigue.  Eyes: Negative for blurred vision.  Respiratory: has cough and shortness of breath    Cardiovascular: . Negative for chest pain and palpitations.  Gastrointestinal: Negative for heartburn, abdominal pain and constipation.  Musculoskeletal: Negative for joint pain and myalgias.  Skin: bruising; skin tear on right arm.    Neurological: Negative for weakness.  Psychiatric/Behavioral: Negative for depression. The patient is not nervous/anxious.       Physical Exam  Constitutional: No distress.  thin  Neck: Neck supple. No JVD  present.  Cardiovascular: Normal rate, regular rhythm and intact distal pulses.   Respiratory: Effort increased  No respiratory distress. Rales and rhonchi throughout on 02  GI: Soft. Bowel sounds are normal. She exhibits no distension. There is no tenderness.  Musculoskeletal: Normal range of motion. She exhibits edema.  Has 1+ lower extremity edema   Neurological: She is alert.  Skin: Skin is warm and dry. She is not diaphoretic. has bruising on bilateral arms with skin tear on right lower arm. Has small areas of bruising on chest and lower extremities.      ASSESSMENT/ PLAN:  1. Pneumonia with vascular congestion: will continue her levaquin started on 06-20-13. Will stop her plavix due to her bruising. Will increase her lasix to 49 mg daily for 3 days then return her back to 20 mg daily. Will begin her duoneb every 6 hours for 10 days. Will check a cbc and bmp and will continue to monitor her status.      Synthia Innocent NP [facility]  Contact [phone] Monday through Friday 8am- 5pm  After hours call [phone]

## 2013-06-24 NOTE — Progress Notes (Signed)
Patient ID: Kaylee Schmitt, female   DOB: Jul 28, 1923, 80 y.o.   MRN: [id number]     [facility]  Allergies  Allergen Reactions  . Ramipril Cough  . Adhesive [Tape] Rash    Paper tape OK.  Marland Kitchen Penicillins Itching and Rash  . Sulfa Antibiotics Itching and Rash     Chief Complaint  Patient presents with  . Acute Visit    follow up pneumonia     HPI:  She is coughing with shortness of breath. She has been started on levequin 500 mg daily for 10 days on 06-20-13. She states she is feeling better today. She does remain weak. Her k+ level is slightly low at 3.4. Her blood pressure has been running high at 155-177/66-72. She will require adjustment of her medication in order to better treat her blood pressure.   Past Medical History  Diagnosis Date  . Aortic stenosis   . CAD (coronary artery disease) 10/12/2012    Cath 2011  Calcified LM,  80% mid LAD, dominant circ without sig disease, Ostial nondominant RCA.  Severe AS  Liberte stent x 2 2011 Dr. Amil Amen    . Gout     "not for years" (05/15/2013)  . Hypertensive heart disease 10/12/2012  . Hypertension   . Chronic kidney disease   . S/P TAVR (transcatheter aortic valve replacement) 11/27/2012    Stephannie Peters XT transcatheter heart valve (size 23mm) placed via open left transfemoral approach  . Hyperlipidemia 10/12/2012    intolerant to statins  . Carotid artery occlusion     moderate bilaterally  . Chronic diastolic CHF (congestive heart failure)   . Macular degeneration, left eye   . Myocardial infarction ~ 2013    "once"  . Pneumonia     "several times" (05/15/2013)  . Chronic bronchitis     "get it ~ q yr" (05/15/2013)  . Exertional shortness of breath   . History of blood transfusion     "related to ruptured appendix" (05/15/2013)  . GERD (gastroesophageal reflux disease)   . CVA (cerebral vascular accident) 2003    while living in [place].   Right brain with left hemiparesis     Past Surgical History  Procedure Laterality Date  .  Cholecystectomy    . Appendectomy    . Cataract extraction w/ intraocular lens  implant, bilateral Bilateral   . Transcatheter aortic valve replacement, transfemoral N/A 11/27/2012    Procedure: TRANSCATHETER AORTIC VALVE REPLACEMENT, TRANSFEMORAL;  Surgeon: Tonny Bollman, MD;  Location: [facility] OR;  Service: Cardiovascular;  Laterality: N/A;  . Intraoperative transesophageal echocardiogram N/A 11/27/2012    Procedure: INTRAOPERATIVE TRANSESOPHAGEAL ECHOCARDIOGRAM;  Surgeon: Tonny Bollman, MD;  Location: [facility] OR;  Service: Cardiovascular;  Laterality: N/A;  . Cardiac valve replacement    . Tonsillectomy    . Cardiac catheterization    . Coronary angioplasty with stent placement      "I've had 2" (05/15/2013)    VITAL SIGNS BP 155/72  Pulse 68  Ht 4\' 10"  (1.473 m)  Wt 124 lb 3.2 oz (56.337 kg)  BMI 25.96 kg/m2   Patient's Medications  New Prescriptions   No medications on file  Previous Medications   ASPIRIN EC 81 MG TABLET    Take 81 mg by mouth every morning.   ATENOLOL (TENORMIN) 12.5 MG TABS TABLET    Take 1 tablet (25 mg total) by mouth daily.   ATORVASTATIN (LIPITOR) 10 MG TABLET    Take 10 mg by mouth daily.  CLOPIDOGREL (PLAVIX) 75 MG TABLET    Take 1 tablet (75 mg total) by mouth daily with breakfast.   DIPHENOXYLATE-ATROPINE (LOMOTIL) 2.5-0.025 MG PER TABLET    Take 1 tablet by mouth 4 (four) times daily as needed for diarrhea or loose stools.   FUROSEMIDE (LASIX) 20 MG TABLET    Take 1 tablet (20 mg total) by mouth daily.   LOSARTAN (COZAAR) 25 MG TABLET    Take 12.5 mg by mouth daily.    MIRTAZAPINE (REMERON) 15 MG TABLET    Take 0.5 tablets (7.5 mg total) by mouth at bedtime.   OMEPRAZOLE (PRILOSEC) 20 MG CAPSULE    Take 20 mg by mouth 2 (two) times daily before a meal.    ONDANSETRON (ZOFRAN) 4 MG TABLET    Take 4 mg by mouth every 8 (eight) hours as needed for nausea or vomiting.   PHENADOZ 25 MG SUPPOSITORY    Place 25 mg rectally every 6 (six) hours as needed.    SERTRALINE (ZOLOFT) 100 MG TABLET    Take 100 mg by mouth every morning.    SUCRALFATE (CARAFATE) 1 GM/10ML SUSPENSION    Take 1 g by mouth 4 (four) times daily -  with meals and at bedtime.  Modified Medications   No medications on file  Discontinued Medications   No medications on file    SIGNIFICANT DIAGNOSTIC EXAMS  04-22-13: UGI with kub: 1. Limited examination due to patient condition, as discussed above. 2. Although a discrete stricture is not identified, a 13 mm bariumpill would not pass beyond the gastroesophageal junction.   05-15-13: chest x-ray: No active cardiopulmonary disease.  05-15-13: ct of abdomen and pelvis: No acute findings to explain the patient's given symptoms. The appearance of slight wall thickening involving the transverse colon is likely due to underdistention.  06-19-13: chest x-ray; poor inspiration. Old granulamous disease. Mild cardiomegaly without change. Minimal pulmonary vascular congestion improved. No pleural effusion. Patchy pneumonitis or atelectasis at left lower lung.     LABS REVIEWED:   05-15-13: wbc 6.4; hgb 13.2; hct 38.1; mcv 87.4 plt 155; glucose 124; bun 28; creat 1.55; k+3.0; na++141 Liver normal albumin 3.0; lipase 29; phos 3.1; mag 1.9 urine culture: e-coli 05-16-13: glucose 68; bun 26; creat 1.35; k+4.2; na++138; tsh 4.503; free t4: 0.99 05-17-13: hepatis panel: neg; hiv: nr 05-20-13: glucose 80; bun 8; creat 0.96; k+4.1; na++ 135 05-27-13: glucose 77; bun 13; creat 1.0; k+4.4; na++ 133 05-30-13: glucose 93; bun 10; creat 1.1; k+4.2; na++ 136  06-19-13: wbc 7.5;hgb 8.4; hct 25.9; mcv 95.9; plt 225; glucose 85; bun 14; creat 0.9; k+3.4; na++142       Review of Systems  Constitutional: Negative for malaise/fatigue.  Eyes: Negative for blurred vision.  Respiratory: has cough and shortness of breath    Cardiovascular: Positive for leg swelling. Negative for chest pain and palpitations.  Gastrointestinal: Negative for heartburn, abdominal pain and  constipation.  Musculoskeletal: Negative for joint pain and myalgias.  Skin: Negative.   Neurological: Negative for weakness.  Psychiatric/Behavioral: Negative for depression. The patient is not nervous/anxious.       Physical Exam  Constitutional: No distress.  thin  Neck: Neck supple. No JVD present.  Cardiovascular: Normal rate, regular rhythm and intact distal pulses.   Respiratory: Effort normal  No respiratory distress. Rales and rhonchi throughout on 02  GI: Soft. Bowel sounds are normal. She exhibits no distension. There is no tenderness.  Musculoskeletal: Normal range of motion. She exhibits edema.  Has 2+ lower extremity edema   Neurological: She is alert.  Skin: Skin is warm and dry. She is not diaphoretic.        ASSESSMENT/ PLAN:  1. Hypertension: is not well controlled: will increase her cozaar to 25 mg daily and will continue to monitor her status   2. Hypokalemia: will begin k+ 20 meq and will check k+ in one week  3. Pneumonia: will continue her abt and will continue to monitor her status. Will continue her 02. At this time will not make any further changes.      Synthia Innocenteborah Noeh Sparacino NP [facility]  Contact [phone] Monday through Friday 8am- 5pm  After hours call [phone]

## 2013-06-25 DIAGNOSIS — R0989 Other specified symptoms and signs involving the circulatory and respiratory systems: Secondary | ICD-10-CM | POA: Insufficient documentation

## 2013-06-25 DIAGNOSIS — R233 Spontaneous ecchymoses: Secondary | ICD-10-CM | POA: Insufficient documentation

## 2013-06-25 DIAGNOSIS — R238 Other skin changes: Secondary | ICD-10-CM | POA: Insufficient documentation

## 2013-07-01 ENCOUNTER — Ambulatory Visit: Payer: Medicare Other | Admitting: Cardiology

## 2013-07-10 DEATH — deceased

## 2013-07-23 ENCOUNTER — Ambulatory Visit: Payer: Medicare Other | Admitting: Cardiology

## 2014-03-20 ENCOUNTER — Encounter (HOSPITAL_COMMUNITY): Payer: Self-pay | Admitting: Interventional Cardiology

## 2014-07-15 IMAGING — CR DG CHEST 1V PORT
1 series · 1 of 1 positions shown · non-contrast
Comparison: 09/15/2011

CLINICAL DATA: Chest pain. Shortness of breath..  Stroke.
Congestive heart failure.

PORTABLE CHEST - 1 VIEW

[AP]
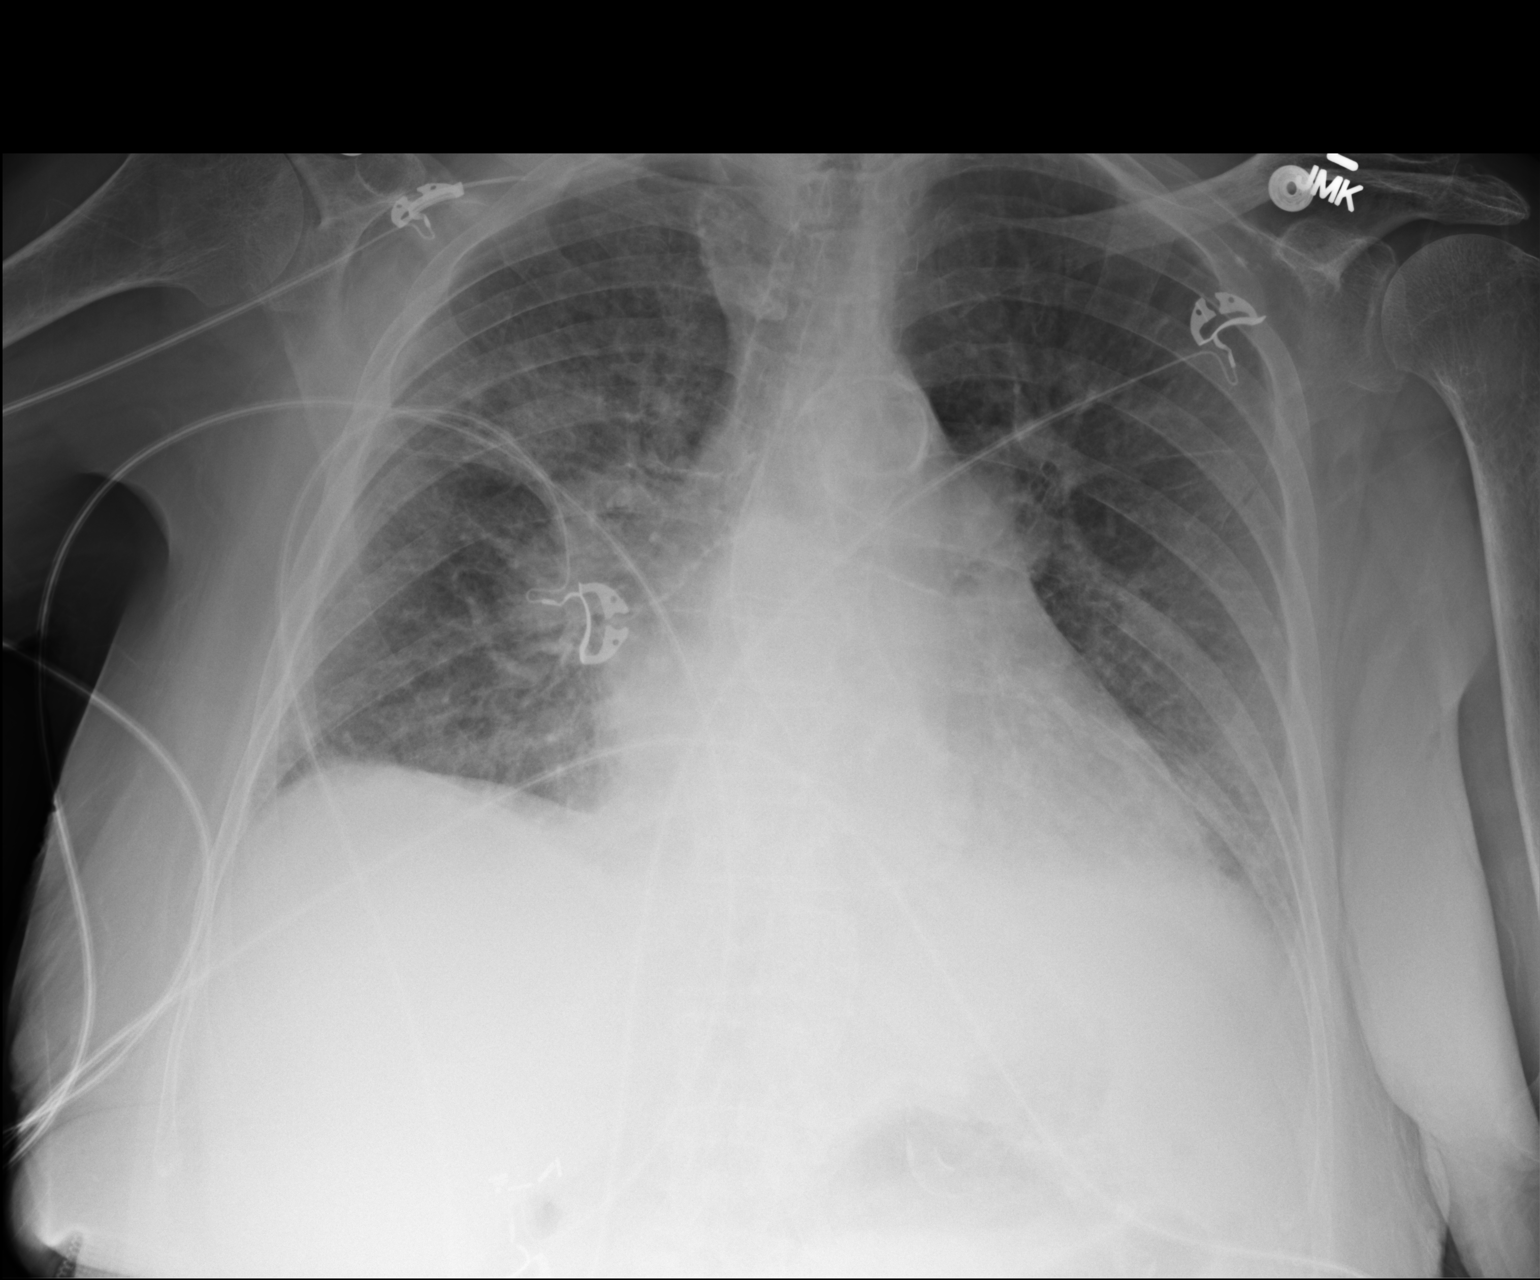

[1 of 1 positions shown; findings below may reference images not displayed]

FINDINGS: Cardiomegaly noted with perihilar and left basilar
airspace opacities which are increased compared to prior.

Atherosclerotic calcification of the aortic arch is noted.  Kerley
B lines are present and increased from prior exam.
IMPRESSION: 1.  Perihilar and left basilar airspace opacities with Kerley B
lines, favoring edema over multilobar pneumonia.
2.  Cardiomegaly.

## 2014-07-19 IMAGING — CR DG CHEST 2V
1 series · 1 of 1 positions shown · non-contrast
Comparison: 10/12/2012; 09/14/2028; 01/20/2010

CLINICAL DATA: Shortness of breath

CHEST - 2 VIEW

[w chest lat]
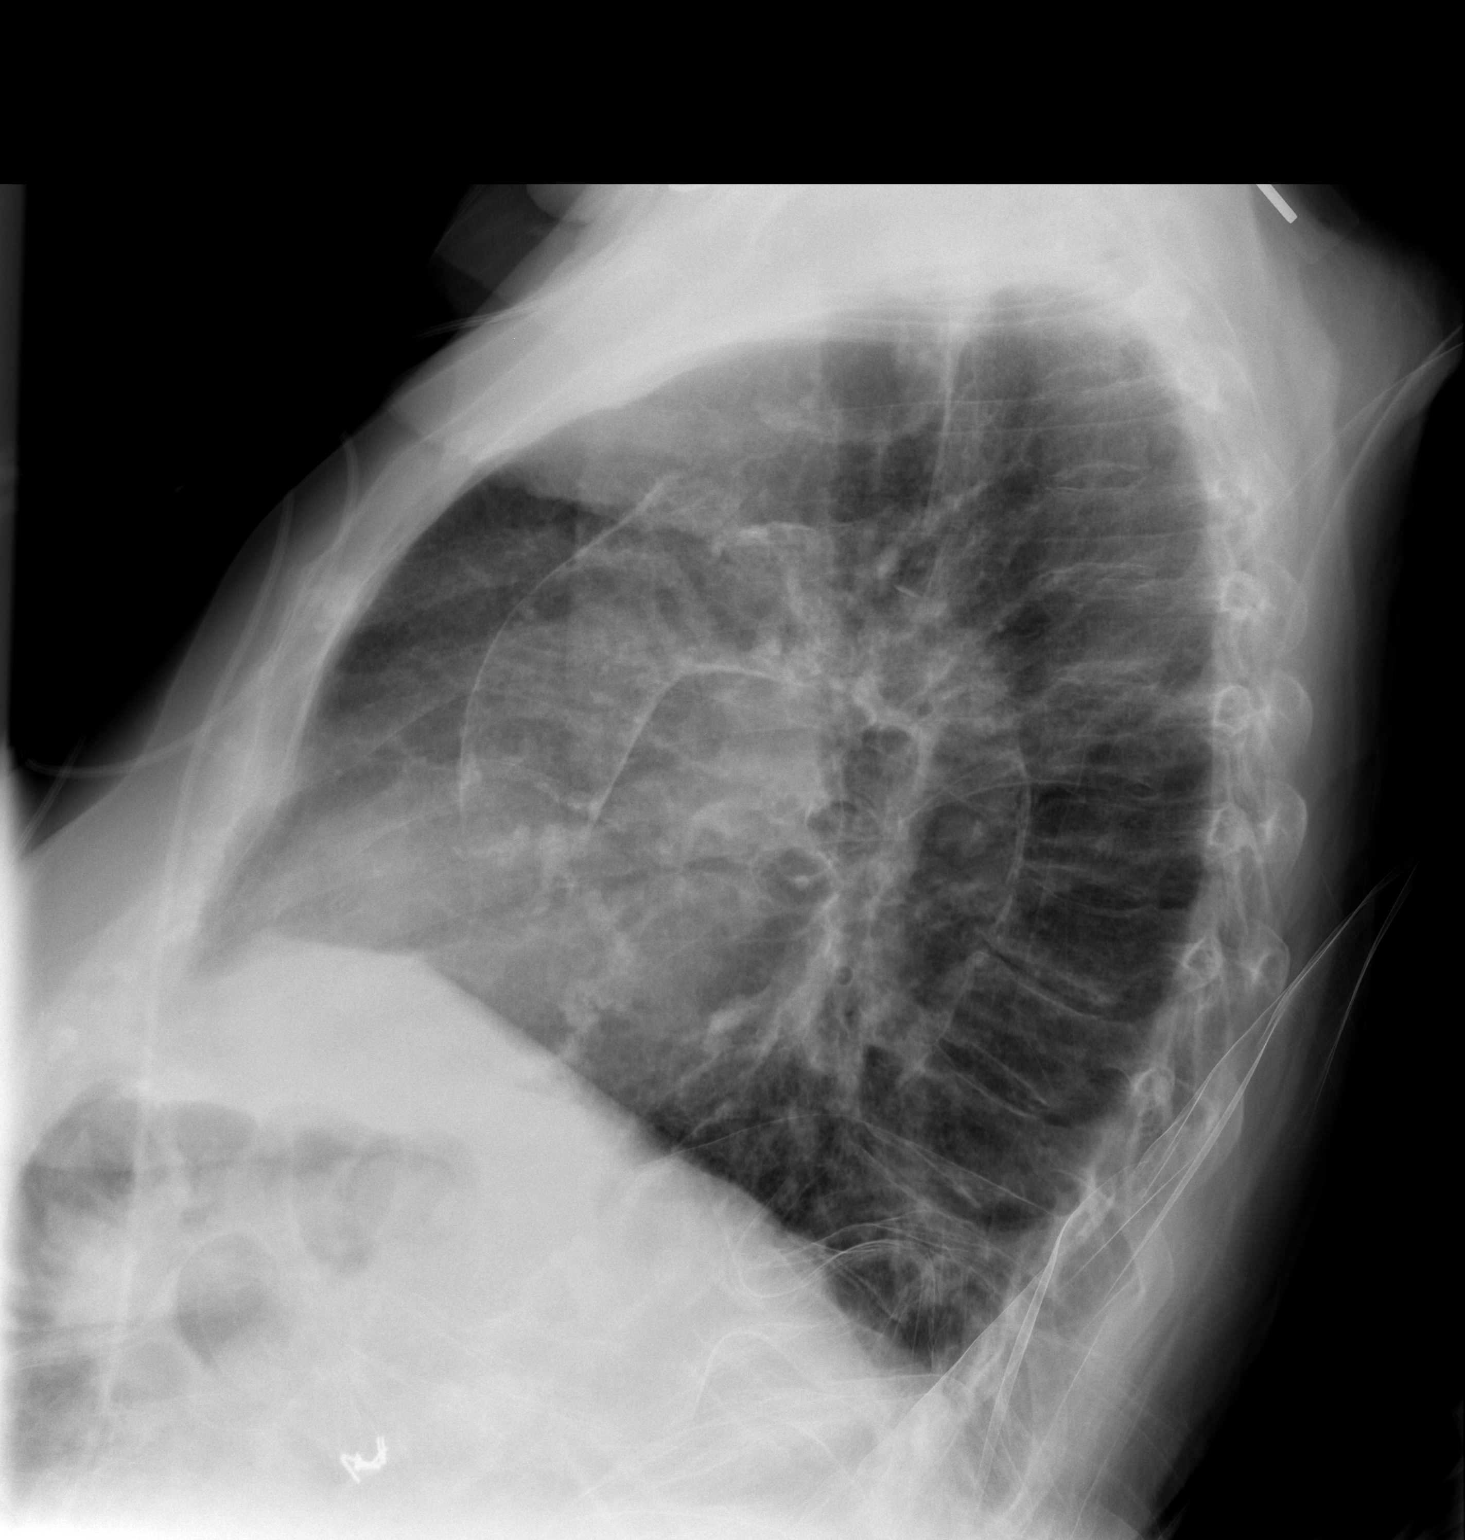

[1 of 1 positions shown; findings below may reference images not displayed]

FINDINGS: Grossly unchanged enlarged cardiac silhouette and
mediastinal contours with atherosclerotic calcifications within the
thoracic aorta. Calcifications within the mitral valve annulus.
Overall improved aeration of the lungs.  Improved aeration of the
bilateral lung bases with persistent minimal bibasilar opacities
favored to represent atelectasis or scar. No pleural effusion or
pneumothorax.  No acute osseous abnormalities.  Post
cholecystectomy.
IMPRESSION: Improved/resolved pulmonary edema without acute cardiopulmonary
disease.

## 2014-08-30 IMAGING — CR DG CHEST 1V PORT
1 series · 1 of 1 positions shown · non-contrast
Comparison: 11/23/2012.

CLINICAL DATA: Postop aortic valve replacement.

PORTABLE CHEST - 1 VIEW

[AP]
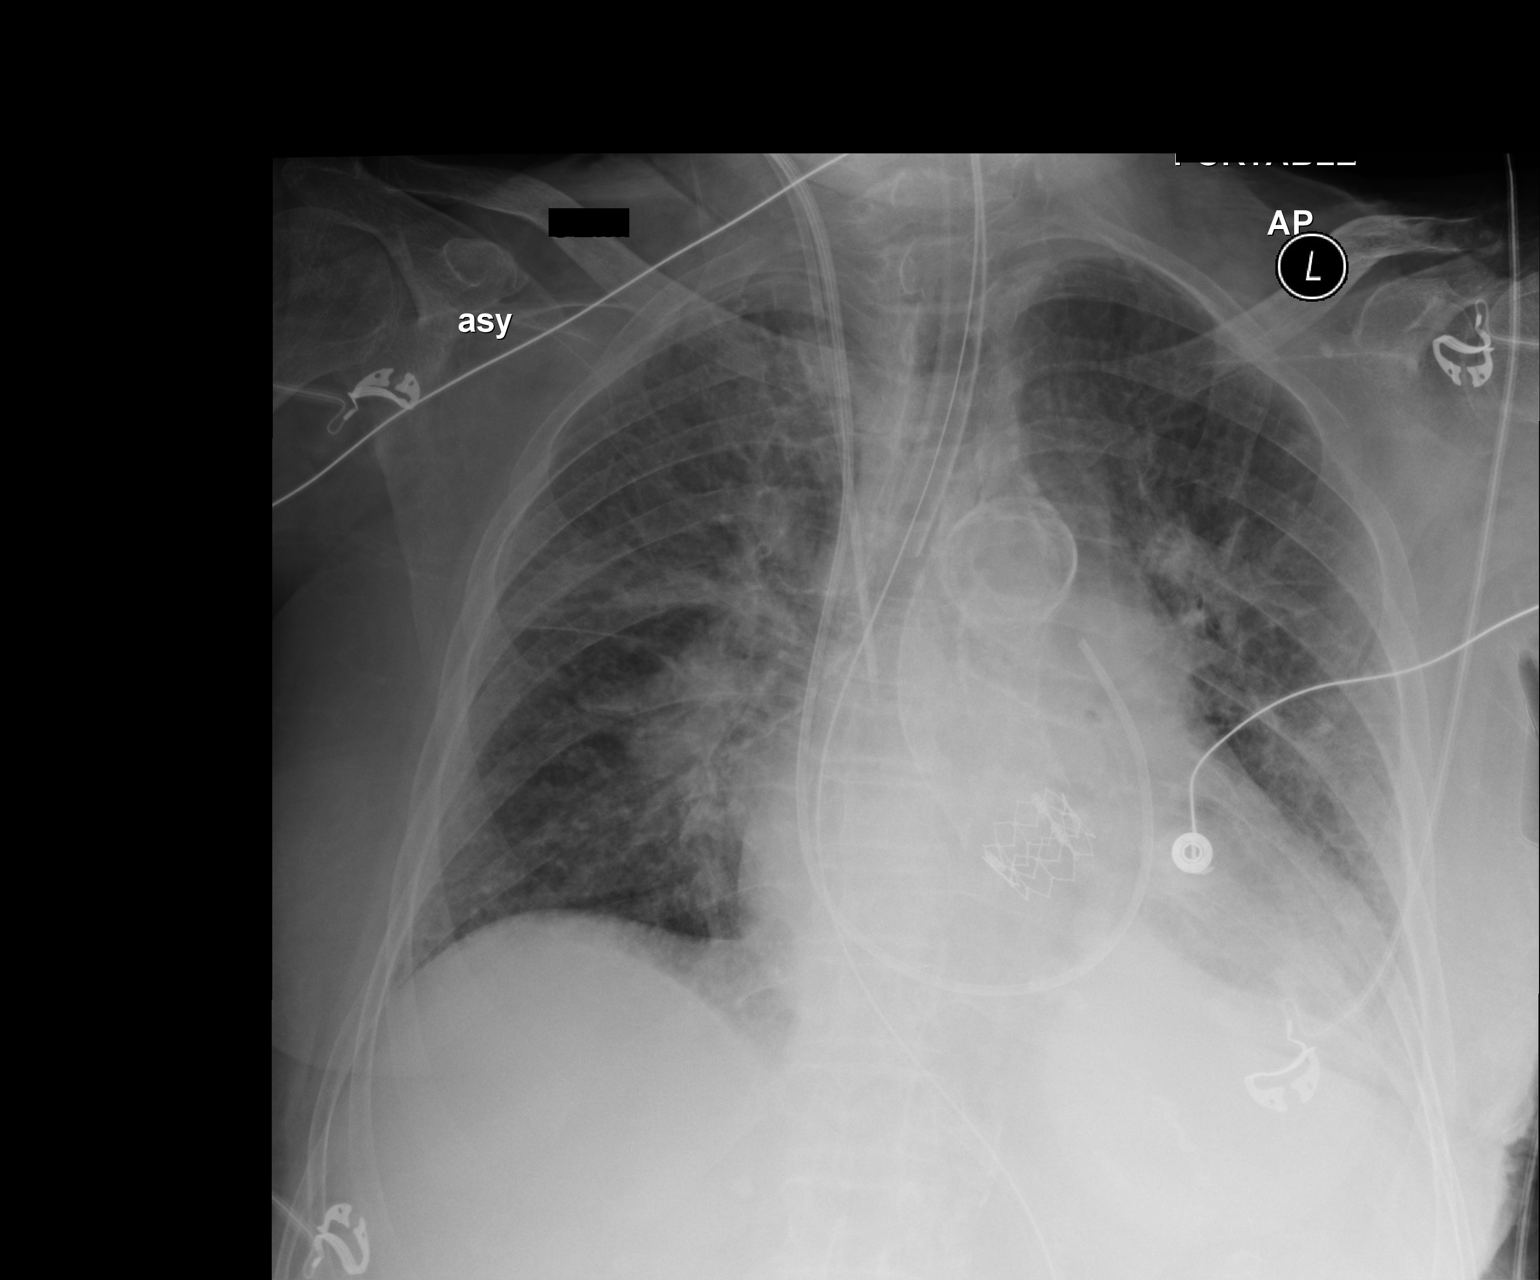

[1 of 1 positions shown; findings below may reference images not displayed]

FINDINGS: Endotracheal tube terminates 1.4 cm above the carina.
Nasogastric tube is followed into the stomach with the tip
projecting beyond the inferior margin of the image.  Right IJ
central line tip projects over the SVC.  Right IJ Swan-Ganz
catheter tip projects over the proximal right pulmonary artery.

Heart is enlarged, stable.  Thoracic aorta is calcified. Aortic
valve repair noted. Central pulmonary vascular congestion with mild
mixed interstitial and air space disease bilaterally.  No definite
pneumothorax.  No definite pleural fluid.
IMPRESSION: 1.  Endotracheal tube is slightly low-lying.  Retracting
approximately 2 cm would better position the tip above the carina.
These results will be called to the ordering clinician or
representative by the Radiologist Assistant, and communication
documented in the PACS Dashboard.
2.  Central pulmonary vascular congestion with mild bilateral mixed
interstitial airspace disease, indicative of edema.
3.  No pneumothorax.

## 2015-02-15 IMAGING — CT CT ABD-PELV W/O CM
1 series · 1 of 1 positions shown · non-contrast
Comparison: DG UGI W/KUB dated 04/22/2013; CT CTA ABD/PEL W/CM
AND/OR W/O CM dated 11/06/2012

CLINICAL DATA: Nausea, vomiting and diarrhea.

EXAM:
CT ABDOMEN AND PELVIS WITHOUT CONTRAST
TECHNIQUE: Multidetector CT imaging of the abdomen and pelvis was performed
following the standard protocol without intravenous contrast.

[Series 3: scout · coronal · 0.6mm · 0.98mm/px · 1 of 1 slices shown]
[im 1/1]
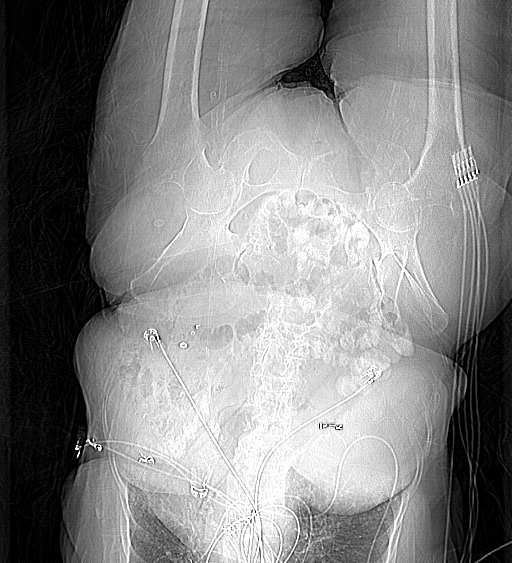

[1 of 1 positions shown; findings below may reference images not displayed]

FINDINGS: Lung bases show subpleural reticulation. Heart is at the upper
limits of normal in size. No pericardial or pleural effusion.

Liver is unremarkable. Cholecystectomy. Adrenal glands, kidneys,
spleen, pancreas, stomach and small bowel are unremarkable. Apparent
wall thickening involving the transverse colon may be due to
underdistention. Colon is otherwise unremarkable. Air in the bladder
is presumably iatrogenic. Uterus and ovaries are visualized. Trace
pelvic free fluid. Atherosclerotic calcification of the arterial
vasculature without abdominal aortic aneurysm. No pathologically
enlarged lymph nodes. No worrisome lytic or sclerotic lesions.
Degenerative changes are seen in the spine. Grade 1 anterolisthesis
of L4 on L5, as before.
IMPRESSION: No acute findings to explain the patient's given symptoms. The
appearance of slight wall thickening involving the transverse colon
is likely due to underdistention.
